# Patient Record
Sex: Male | Born: 1935 | Hispanic: No | Smoking: Former smoker
Health system: Southern US, Community
[De-identification: ages and names within clinical notes are randomized; demographics above are authoritative.]

## PROBLEM LIST (undated history)

## (undated) DIAGNOSIS — K529 Noninfective gastroenteritis and colitis, unspecified: Secondary | ICD-10-CM

## (undated) DIAGNOSIS — K625 Hemorrhage of anus and rectum: Secondary | ICD-10-CM

## (undated) DIAGNOSIS — I739 Peripheral vascular disease, unspecified: Secondary | ICD-10-CM

## (undated) DIAGNOSIS — I2510 Atherosclerotic heart disease of native coronary artery without angina pectoris: Secondary | ICD-10-CM

## (undated) DIAGNOSIS — F32A Depression, unspecified: Secondary | ICD-10-CM

## (undated) DIAGNOSIS — I10 Essential (primary) hypertension: Secondary | ICD-10-CM

## (undated) DIAGNOSIS — K559 Vascular disorder of intestine, unspecified: Secondary | ICD-10-CM

## (undated) DIAGNOSIS — I517 Cardiomegaly: Secondary | ICD-10-CM

## (undated) DIAGNOSIS — I4821 Permanent atrial fibrillation: Secondary | ICD-10-CM

## (undated) DIAGNOSIS — F329 Major depressive disorder, single episode, unspecified: Secondary | ICD-10-CM

## (undated) DIAGNOSIS — M5126 Other intervertebral disc displacement, lumbar region: Secondary | ICD-10-CM

## (undated) HISTORY — DX: Noninfective gastroenteritis and colitis, unspecified: K52.9

## (undated) HISTORY — DX: Hemorrhage of anus and rectum: K62.5

## (undated) HISTORY — DX: Cardiomegaly: I51.7

## (undated) HISTORY — DX: Vascular disorder of intestine, unspecified: K55.9

## (undated) HISTORY — DX: Atherosclerotic heart disease of native coronary artery without angina pectoris: I25.10

## (undated) HISTORY — DX: Peripheral vascular disease, unspecified: I73.9

## (undated) HISTORY — DX: Other intervertebral disc displacement, lumbar region: M51.26

---

## 2002-01-08 HISTORY — PX: CARDIAC CATHETERIZATION: SHX172

## 2002-01-08 HISTORY — PX: CORONARY ARTERY BYPASS GRAFT: SHX141

## 2002-01-16 ENCOUNTER — Encounter: Payer: Self-pay | Admitting: *Deleted

## 2002-01-16 ENCOUNTER — Inpatient Hospital Stay (HOSPITAL_COMMUNITY): Admission: RE | Admit: 2002-01-16 | Discharge: 2002-01-23 | Payer: Self-pay | Admitting: *Deleted

## 2002-01-17 ENCOUNTER — Encounter: Payer: Self-pay | Admitting: Cardiothoracic Surgery

## 2002-01-18 ENCOUNTER — Encounter: Payer: Self-pay | Admitting: Cardiothoracic Surgery

## 2002-01-19 ENCOUNTER — Encounter: Payer: Self-pay | Admitting: Cardiothoracic Surgery

## 2002-01-20 ENCOUNTER — Encounter: Payer: Self-pay | Admitting: Cardiothoracic Surgery

## 2005-05-23 ENCOUNTER — Ambulatory Visit: Payer: Self-pay | Admitting: Internal Medicine

## 2005-05-30 ENCOUNTER — Ambulatory Visit: Payer: Self-pay | Admitting: *Deleted

## 2005-06-06 ENCOUNTER — Ambulatory Visit: Payer: Self-pay | Admitting: Internal Medicine

## 2005-06-14 ENCOUNTER — Ambulatory Visit (HOSPITAL_COMMUNITY): Admission: RE | Admit: 2005-06-14 | Discharge: 2005-06-14 | Payer: Self-pay | Admitting: Internal Medicine

## 2005-06-14 ENCOUNTER — Ambulatory Visit: Payer: Self-pay | Admitting: Internal Medicine

## 2005-06-21 ENCOUNTER — Ambulatory Visit: Payer: Self-pay | Admitting: Internal Medicine

## 2005-07-14 ENCOUNTER — Ambulatory Visit: Payer: Self-pay | Admitting: Family Medicine

## 2005-07-26 ENCOUNTER — Ambulatory Visit: Payer: Self-pay | Admitting: Internal Medicine

## 2005-08-08 ENCOUNTER — Ambulatory Visit: Payer: Self-pay | Admitting: Internal Medicine

## 2005-08-16 ENCOUNTER — Ambulatory Visit: Payer: Self-pay | Admitting: Internal Medicine

## 2005-09-12 ENCOUNTER — Ambulatory Visit: Payer: Self-pay | Admitting: Internal Medicine

## 2006-12-18 ENCOUNTER — Emergency Department (HOSPITAL_COMMUNITY): Admission: EM | Admit: 2006-12-18 | Discharge: 2006-12-18 | Payer: Self-pay | Admitting: Emergency Medicine

## 2007-01-29 DIAGNOSIS — I219 Acute myocardial infarction, unspecified: Secondary | ICD-10-CM | POA: Insufficient documentation

## 2007-01-29 DIAGNOSIS — N3944 Nocturnal enuresis: Secondary | ICD-10-CM

## 2007-01-29 DIAGNOSIS — E119 Type 2 diabetes mellitus without complications: Secondary | ICD-10-CM

## 2007-01-29 DIAGNOSIS — L259 Unspecified contact dermatitis, unspecified cause: Secondary | ICD-10-CM

## 2007-01-29 DIAGNOSIS — I4821 Permanent atrial fibrillation: Secondary | ICD-10-CM

## 2007-01-29 DIAGNOSIS — I150 Renovascular hypertension: Secondary | ICD-10-CM

## 2007-01-30 ENCOUNTER — Encounter (INDEPENDENT_AMBULATORY_CARE_PROVIDER_SITE_OTHER): Payer: Self-pay | Admitting: Internal Medicine

## 2008-08-12 ENCOUNTER — Ambulatory Visit: Payer: Self-pay | Admitting: Internal Medicine

## 2008-08-12 ENCOUNTER — Encounter (INDEPENDENT_AMBULATORY_CARE_PROVIDER_SITE_OTHER): Payer: Self-pay | Admitting: Internal Medicine

## 2008-08-12 LAB — CONVERTED CEMR LAB
ALT: 23 units/L (ref 0–53)
Alkaline Phosphatase: 34 units/L — ABNORMAL LOW (ref 39–117)
BUN: 19 mg/dL (ref 6–23)
Basophils Absolute: 0.1 10*3/uL (ref 0.0–0.1)
Basophils Relative: 1 % (ref 0–1)
Calcium: 9.3 mg/dL (ref 8.4–10.5)
Chloride: 104 meq/L (ref 96–112)
Eosinophils Absolute: 0.1 10*3/uL (ref 0.0–0.7)
Eosinophils Relative: 1 % (ref 0–5)
Glucose, Bld: 143 mg/dL — ABNORMAL HIGH (ref 70–99)
HCT: 45.3 % (ref 39.0–52.0)
Hemoglobin: 14.3 g/dL (ref 13.0–17.0)
Lymphocytes Relative: 20 % (ref 12–46)
Lymphs Abs: 1.4 10*3/uL (ref 0.7–4.0)
MCHC: 31.6 g/dL (ref 30.0–36.0)
MCV: 91.5 fL (ref 78.0–100.0)
Microalb, Ur: 56 mg/dL — ABNORMAL HIGH (ref 0.00–1.89)
Monocytes Absolute: 0.6 10*3/uL (ref 0.1–1.0)
Monocytes Relative: 9 % (ref 3–12)
Neutro Abs: 5 10*3/uL (ref 1.7–7.7)
Platelets: 180 10*3/uL (ref 150–400)
Potassium: 4.7 meq/L (ref 3.5–5.3)
RDW: 13.6 % (ref 11.5–15.5)
Sodium: 141 meq/L (ref 135–145)
Total Bilirubin: 1.4 mg/dL — ABNORMAL HIGH (ref 0.3–1.2)
Total Protein: 7.2 g/dL (ref 6.0–8.3)
WBC: 7.1 10*3/uL (ref 4.0–10.5)

## 2008-08-25 ENCOUNTER — Encounter: Payer: Self-pay | Admitting: Cardiology

## 2008-08-25 ENCOUNTER — Ambulatory Visit: Payer: Self-pay

## 2008-08-27 ENCOUNTER — Ambulatory Visit: Payer: Self-pay | Admitting: Cardiology

## 2008-09-01 ENCOUNTER — Encounter (INDEPENDENT_AMBULATORY_CARE_PROVIDER_SITE_OTHER): Payer: Self-pay | Admitting: Internal Medicine

## 2008-09-01 ENCOUNTER — Ambulatory Visit: Payer: Self-pay | Admitting: Internal Medicine

## 2008-09-01 LAB — CONVERTED CEMR LAB: Digitoxin Lvl: 1 ng/mL (ref 0.8–2.0)

## 2008-09-10 ENCOUNTER — Ambulatory Visit: Payer: Self-pay | Admitting: Internal Medicine

## 2008-09-22 ENCOUNTER — Ambulatory Visit: Payer: Self-pay | Admitting: Cardiology

## 2008-09-25 ENCOUNTER — Encounter (INDEPENDENT_AMBULATORY_CARE_PROVIDER_SITE_OTHER): Payer: Self-pay | Admitting: *Deleted

## 2008-09-26 ENCOUNTER — Encounter: Payer: Self-pay | Admitting: Cardiology

## 2010-08-30 ENCOUNTER — Other Ambulatory Visit: Payer: Self-pay | Admitting: Internal Medicine

## 2010-08-30 DIAGNOSIS — M79605 Pain in left leg: Secondary | ICD-10-CM

## 2010-09-07 ENCOUNTER — Other Ambulatory Visit: Payer: Self-pay | Admitting: Internal Medicine

## 2010-09-07 ENCOUNTER — Ambulatory Visit
Admission: RE | Admit: 2010-09-07 | Discharge: 2010-09-07 | Disposition: A | Discharge status: Home / Self Care | Payer: Medicaid Other | Source: Ambulatory Visit | Attending: Internal Medicine | Admitting: Internal Medicine

## 2010-09-07 ENCOUNTER — Other Ambulatory Visit: Payer: Self-pay

## 2010-09-07 DIAGNOSIS — M79604 Pain in right leg: Secondary | ICD-10-CM

## 2010-09-07 DIAGNOSIS — I10 Essential (primary) hypertension: Secondary | ICD-10-CM

## 2010-09-27 ENCOUNTER — Encounter: Payer: Self-pay | Admitting: Internal Medicine

## 2010-09-27 ENCOUNTER — Institutional Professional Consult (permissible substitution) (INDEPENDENT_AMBULATORY_CARE_PROVIDER_SITE_OTHER): Payer: Medicaid Other | Admitting: Internal Medicine

## 2010-09-27 DIAGNOSIS — I739 Peripheral vascular disease, unspecified: Secondary | ICD-10-CM

## 2010-09-27 DIAGNOSIS — I7389 Other specified peripheral vascular diseases: Secondary | ICD-10-CM | POA: Insufficient documentation

## 2010-09-27 DIAGNOSIS — I2510 Atherosclerotic heart disease of native coronary artery without angina pectoris: Secondary | ICD-10-CM

## 2010-09-27 DIAGNOSIS — I4891 Unspecified atrial fibrillation: Secondary | ICD-10-CM

## 2010-09-27 DIAGNOSIS — I10 Essential (primary) hypertension: Secondary | ICD-10-CM

## 2010-10-07 NOTE — Assessment & Plan Note (Signed)
Summary: nep/abnormal ekg/ref roy moreira md [phone]/medicare pt dtr ...   Visit Type:  Initial Consult Primary Provider:  Dr. Emilio Math  CC:  pt says when he eats sometimes his chest hurts. pt has no other complaints today.[name]  History of Present Illness: [name] is referred today by Dr. Cindee Lame for evaluation of atrial fib and peripheral vascular disease. He has known CAD and underwent CABG in 2003. He has persistent HTN and this has been difficult to control. With regard to his atrial fib, he has rare palpitations but he is mainly limited by claudication. He recently underwent ABI's which demonstrated non-compressible flow in the right leg and an ABI of 0.59 on the left. He has not had syncope or chest pain.   Medications Prior to Update: 1)  Digoxin 0.25 Mg  Tabs (Digoxin) .... One By Mouth Qd 2)  Metformin Hcl 500 Mg  Tabs (Metformin Hcl) .... One By Mouth Bid 3)  Cardizem Cd 120 Mg  Cp24 (Diltiazem Hcl Coated Beads) .... One By Mouth Once Daily 4)  Coumadin 2 Mg  Tabs (Warfarin Sodium) .... Per [facility] 5)  Glyburide 5 Mg  Tabs (Glyburide) .[name].. 1 Tablet By Mouth Daily 6)  Lisinopril 5 Mg Tabs (Lisinopril) .[name].. 1 Tablet By Mouth Daily 7)  Verapamil Hcl 80 Mg Tabs (Verapamil Hcl) .... Once Daily 8)  Coumadin 5 Mg Tabs (Warfarin Sodium) .... Uad 9)  Aciphex 20 Mg Tbec (Rabeprazole Sodium) .[name].. 1 Once Daily  Current Medications (verified): 1)  Coumadin 2 Mg  Tabs (Warfarin Sodium) .... Per [facility] 2)  Coumadin 5 Mg Tabs (Warfarin Sodium) .... Uad 3)  Tradjenta 5 Mg Tabs (Linagliptin) .... Take 1 Tablet By Mouth Once A Day 4)  Bystolic 5 Mg Tabs (Nebivolol Hcl) .... .qdtab  Allergies (verified): No Known Drug Allergies  Past History:  Past Medical History: Last updated: 08-29-2008 Severe three-vessel coronary artery disease with class III angina. Dibeties type II Atrial Fibrillation  Past Surgical History: Last updated: 01/29/2007 Coronary artery  bypass graft  Family History: Last updated: 08-29-2008 His father died of war injuries in World War II.  His mother died of chronic lung disease.  He has a positive family history of hypertension and diabetes.  Social History: Last updated: Aug 29, 2008 Widowed  Tobacco Use - Former.  Alcohol Use - no  Risk Factors: Smoking Status: quit (August 29, 2008)  Review of Systems       All systems reviewed and negative except as noted in the HPI.  Vital Signs:  Patient profile:   77 year old male Height:      69 inches Weight:      173.50 pounds BMI:     25.71 Pulse rate:   60 / minute Resp:     18 per minute BP sitting:   160 / 72  (left arm) Cuff size:   regular  Vitals Entered By: Celestia Khat, CMA (September 27, 2010 1:51 PM)  Physical Exam  General:  Elderly, well developed, well nourished, in no acute distress.  HEENT: normal Neck: supple. No JVD. Carotids 2+ bilaterally no bruits Cor: RRR no rubs, gallops or murmur Lungs: CTA. Well healed median sternotomy Ab: soft, nontender. nondistended. No HSM. Good bowel sounds Ext: warm. no cyanosis, clubbing or edema. Lower legs are cool and pulses are reduced. Neuro: alert and oriented. Grossly nonfocal. affect pleasant    Impression & Recommendations:  Problem # 1:  FIBRILLATION, ATRIAL (ICD-427.31) His ventricular response is well controlled and his  symptoms are well controlled. He will continue meds as below. The following medications were removed from the medication list:    Digoxin 0.25 Mg Tabs (Digoxin) ..... One by mouth qd His updated medication list for this problem includes:    Coumadin 2 Mg Tabs (Warfarin sodium) .[name]... Per coumadin clinic    Coumadin 5 Mg Tabs (Warfarin sodium) ..... Uad    Bystolic 5 Mg Tabs (Nebivolol hcl) ..... Glenn Kitchenqdtab  Problem # 2:  PVD WITH CLAUDICATION (ICD-443.89) His symptoms are fairly severe. I have discussed additional workup. I have reviewed the treatment options with the patient and his  daughter who is his interpreter and speaks english well. He likely has severe disease. He does not have a non-healing ulcer or rest symptoms. I have offered him angiography and treatment based on findings at angiography. He is considering his options.   Problem # 3:  HYPERTENSION, BENIGN (ICD-401.1) His blood pressure is elevated. He has had longstanding HTN which has been difficult to control. The following medications were removed from the medication list:    Cardizem Cd 120 Mg Cp24 (Diltiazem hcl coated beads) ..... One by mouth once daily    Lisinopril 5 Mg Tabs (Lisinopril) .[name]... 1 tablet by mouth daily    Verapamil Hcl 80 Mg Tabs (Verapamil hcl) ..... Once daily His updated medication list for this problem includes:    Bystolic 5 Mg Tabs (Nebivolol hcl) ..... Glenn Kitchenqdtab

## 2010-10-07 NOTE — Letter (Signed)
Summary: Out of Work  [employer], Main Office  [address]   [place], [place] [address]   Phone: [phone]  Fax: [phone]    September 27, 2010   Employee:  [place], [place]    To Whom It May Concern:   For Medical reasons, please excuse the above named employee from work for the following dates:  Start:   09/27/10  End:   09/09/10 She ws with her father at the doctors office.  If you need additional information, please feel free to contact our office.         Sincerely,    Dennis Bast, RN, BSN

## 2010-11-23 NOTE — Assessment & Plan Note (Signed)
[facility]                            CARDIOLOGY OFFICE NOTE   NAME:MARINKOVICKarandeep, Resende                      MRN:          [id number]  DATE:08/27/2008                            DOB:          Sep 07, 1931    The patient has coronary disease.  He underwent CABG in 2003.   Mr. Lamp does not speak English.  He is here with his daughter who  does speak [place].  He is from [place] and over the years he has  traveled back and forth between [place] and the [place].  He has a  history of coronary disease.  He also has a history of atrial  fibrillation.  In the past, he was on Coumadin.  He is no longer on  Coumadin.  I do not know all of the reasons.  He is not having  exertional chest pain or exertional shortness of breath.  His daughter  describes a sensation of feeling of fluid in his upper chest and up  towards his ears.  This is a very difficult description to understand.  He has not had any syncope or presyncope.   ALLERGIES:  No known drug allergies.   MEDICATIONS:  1. Digitek 0.25.  2. Metformin.  3. Glyburide.  4. Verapamil 80.  5. Lisinopril 5.   OTHER MEDICAL PROBLEMS:  See the list below.   REVIEW OF SYSTEMS:  At this time, he is not having any headache or  significant problems.  He does not have any fevers or chills.  There are  no skin rashes.  He is not having any GU symptoms.  I believe that he  may be describing GERD with some of his symptoms.  Otherwise, his review  of systems is negative.   PHYSICAL EXAMINATION:  Blood pressure is 150/90.  Pulse is 110.  The  patient is oriented through the interpretation of his daughter.  HEENT  reveals no xanthelasma.  He has normal extraocular motion.  There are no  carotid bruits.  There is no jugular venous distention.  Lungs are  clear.  Respiratory effort is not labored.  Cardiac exam reveals an S1  with an S2.  There are no clicks or significant murmurs.  His abdomen is  soft.  He  has no significant peripheral edema.   EKG reveals atrial fibrillation with a rapid ventricular response.  He  has diffuse nonspecific ST-T wave changes that are old.  The daughter  has brought multiple x-rays that were done in [place] over the years that  show that he has cardiomegaly.  The patient had a 2-D echo in our office  dated August 25, 2008.  At that time, ejection fraction was 55%.  There was moderate left ventricular hypertrophy.  There was mild mitral  regurgitation.   Problems include  1. Diabetes.  2. Coronary artery disease with coronary artery bypass graft in 2003.  3. Chronic atrial fibrillation.  Currently, his rate is increased.  We      will adjust his medicines further for better rate control.  4. Good left ventricular systolic function by echo on  August 25, 2008.  5. Moderate left ventricular hypertrophy.  6. Sensation of fluid in his upper chest and up his neck.  Etiology      of this is not clear to me.  I believe it is possible that his      rapid atrial fibrillation rate may be giving him some symptoms.   The patient is on a low dose of verapamil.  This will be changed to  Cardizem CD 240 mg daily.  I believe that he may be having some reflux  symptoms.  Omeprazole will be added.  We will see him for cardiology  followup to see if I can help with his atrial fib rate.  Over time, we  will decide if Myoview scan is appropriate.  Also consideration has to  be given to the use of Coumadin.  He was on this in the past.  The  guidelines would call for the use Coumadin in his case.     Luis Abed, MD, [facility]  Electronically Signed    JDK/MedQ  DD: 08/27/2008  DT: 08/28/2008  Job #: [id number]   cc:   Dala Dock on Cleophas Dunker.  Madaline Savage Id-Din, FNP

## 2010-11-26 NOTE — Cardiovascular Report (Signed)
[place]. [facility]  Patient:    LEVANTE, SIMONES Visit Number: [id number] MRN: [id number]          Service Type: SUR Location: 2300 2303 [id number] Attending Physician:  Mikey Bussing Dictated by:   Veneda Melter, M.D. Proc. Date: 01/16/02 Admit Date:  01/16/2002   CC:         Dietrich Pates, M.D. [facility]   Cardiac Catheterization  PROCEDURES PERFORMED: 1. Left heart catheterization. 2. Left ventriculogram. 3. Selective coronary angiography.  DIAGNOSES: 1. Three-vessel coronary artery disease. 2. Normal left ventricular systolic function. 3. Atrial fibrillation.  HISTORY: The patient is a 76 year old white male who is a resident of [place], who is visiting his daughter, who presents with atrial fibrillation and substernal chest discomfort. The patient was told he had a myocardial infarction a number of years ago and this has been treated medically. Unfortunately, he has had crescendo angina as well as new onset atrial fibrillation. He presents for further cardiac assessment.  TECHNIQUE: After informed consent was obtained, the patient was brought to the cardiac catheterization lab where a 6 French sheath was placed in the right femoral artery using modified Seldinger technique. The 6 [place] and JR4 catheters were then used to engage the left and right coronary arteries and selective angiography performed in various projections using manual injections of contrast. A 6 French pigtail was then advanced into the left ventricle and a left ventriculogram performed using power injections of contrast. At the termination of the case the catheters and sheath were removed and manual pressure applied until adequate hemostasis was achieved. The patient tolerated the procedure well and was transferred to the ward in stable condition.  FINDINGS: Findings are as follows: 1. Left main trunk: The left main trunk is calcified. There is distal    taper approximately  40-50% extending into the ostium of the LAD. 2. LAD: The ostium of the LAD has a circumferential haziness that appears    to now lumen by at least 50-60%. The vessel is calcified along its    proximal and mid sections. There is further narrowing of 60% in the mid    section after the first diagonal branch. The first diagonal branch is    a small caliber vessel with moderate disease of 30-40% in the proximal    segment. 3. Left circumflex artery: This is a small caliber vessel that consists of    distal marginal branch with a long tubular narrowing of 70% in the mid    circumflex. 4. Ramus intermedius: There are two medium caliber ramus intermedius    branches. Both appear to be pinched at the ostium by 70%. 5. Right coronary artery is dominant. This is a medium caliber vessel    that provides the posterior descending artery and two posterior ventricular    branches in its terminal segment. The right coronary artery is heavily    calcified in the proximal and mid section. There is a narrowing of 70% in  the proximal segment followed by a long segment of disease of 50-60%.    There is a focal narrowing of 70% in the distal section prior to the    takeoff of the posterior descending artery. The distal branch vessels have    mild irregularities.  LEFT VENTRICULOGRAM: Normal end-systolic and end-diastolic dimensions. Overall left ventricular function is mildly impaired, ejection fraction approximately 40%.  No significant mitral regurgitation is appreciated. LV pressure is 140/15, aortic is 140/100, LVEDP equals 25. The  patient is in atrial fibrillation.  ASSESSMENT AND PLAN: The patient is a 77 year old gentleman with crescendo now unstable angina, who presents with three-vessel coronary artery disease and mild left ventricular dysfunction. Treatment options will be reviewed with the patient and his family including percutaneous intervention to the right coronary artery versus surgical  revascularization. In addition, the patient needs rate control anticoagulation for his atrial fibrillation as well as control of his hypertension. Dictated by:   Veneda Melter, M.D. Attending Physician:  Mikey Bussing DD:  01/16/02 TD:  01/19/02 Job: [id number]

## 2010-11-26 NOTE — Consult Note (Signed)
[place]. [facility]  Patient:    [name], [name] Visit Number: [id number] MRN: [id number]          Service Type: CAT Location: 3700 3727 [id number] Attending Physician:  Veneda Melter Dictated by:   Mikey Bussing, M.D. Proc. Date: 01/16/02 Admit Date:  01/16/2002   CC:         Veneda Melter, M.D.  Dietrich Pates, M.D. [facility]  CVTS Office   Consultation Report  REQUESTING PHYSICIAN:  Veneda Melter, M.D.  REASON FOR CONSULTATION:  Severe three-vessel coronary artery disease with class III angina.  CHIEF COMPLAINT:  Chest pain and shortness of breath.  HISTORY OF PRESENT ILLNESS:  I was asked to evaluate Mr. [name] for potential surgical coronary revascularization for recently diagnosed severe three-vessel coronary artery disease.  The patient presented to the [facility] Cardiology Service with recent onset of exertional chest pain associated with palpitations.  He apparently has had anginal substernal pain as well as jaw discomfort in the past relieved by nitroglycerin and usually occurring with exertion and not usually occurring at rest.  The patient has had an increase in his symptoms of substernal pressing chest pain which is associated with a rapid irregular heart rate.  He was found to be in new onset atrial fibrillation in June of this year.  He was felt to be at high risk for coronary disease with known peripheral vascular disease, diabetes, and history of smoking.  For that reason, he was brought to the hospital and underwent diagnostic cardiac catheterization by Dr. Chales Abrahams today.  This demonstrated a 60% LAD diagonal stenosis, 80% stenosis of the right coronary artery, and 70 to 80% stenosis of the circumflex marginal.  His ejection fraction is 50%, and left ventricular end-diastolic pressure is 15 mmHg.  Because of the patients significant three-vessel disease and his bad coronary anatomy, he was felt to be a candidate for potential surgical  coronary revascularization.  PAST MEDICAL HISTORY: 1. Type 2 diabetes mellitus. 2. Hypertension. 3. Peripheral vascular disease with history of claudication and ABIs of    0.7 bilaterally. 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  MEDICATIONS:  The patient takes nitroglycerin as needed and Isordil at home.  ALLERGIES:  No known drug allergies.  PAST SURGICAL HISTORY:  He has had metal removed from his eye.  No other surgical procedures other than an arteriogram of his lower extremities performed several years ago in [place].  SOCIAL HISTORY:  The patient is visiting here from [place] with his daughter, [name].  His wife died a few years ago.  He smoked up until five years ago at which time he stopped.  He stopped smoking due to claudication and states his claudication has improved.  He does not use alcohol.  FAMILY HISTORY:  His father died of war injuries in World War II.  His mother died of chronic lung disease.  He has a positive family history of hypertension and diabetes.  REVIEW OF SYSTEMS:  The patient denies any significant recent weight loss, fevers, or night sweats.  He denies change in vision or hearing recently.  He has upper dental plates.  He denies dysphagia.  Pulmonary review is positive for dyspnea on exertion.  No history of recent pneumonia, hemoptysis, asthma, TB, or chest trauma or rib fractures.  Cardiac review is positive for exertional angina, negative for resting angina, orthopnea, or PND.  GI review is negative for hepatitis, jaundice, blood per rectum.  GU review is negative for hematuria, prostatitis, or  kidney stones.  Vascular review is positive for peripheral vascular disease with claudication and ABI of 0.7 bilaterally.  He has moderate innominate artery stenosis by Doppler exam.  Skin review is negative for skin cancer or rash.  Hematologic review is negative for bleeding diaphysis.  Neurologic review is negative for stroke, seizure, or  syncope.  he is right-hand dominant.  PHYSICAL EXAMINATION:  VITAL SIGNS:   He is 5 feet 9 inches, weighs 175 pounds.  Blood pressure is measured at 140/80, heart rate 84 and irregular in atrial fibrillation.  GENERAL:  Middle-aged white male in no distress in his hospital room following cardiac catheterization, accompanied by his daughter, [name].  HEENT:  ENT exam is normocephalic.  Full EOMs.  Pharynx clear.  Upper dental plates.  Lower teeth in adequate repair.  NECK:  Without JVD, thyromegaly, mass, or bruits.  LYMPHATICS:  No palpable adenopathy of cervical, supraclavicular, or axillary regions.  CHEST:  Thorax is without deformity.  Breath sounds are clear.  CARDIAC:  Regular rate and rhythm without murmur.  He is in atrial fibrillation.  ABDOMEN:  Soft, nontender without organomegaly or bruit.  VASCULAR:  Reveals 2+ pulses bilaterally in the radial and femoral.  No palpable pedal pulses.  He has no venous insufficiency of the lower extremities.  RECTAL:  Exam is deferred.  NEUROLOGIC:  Alert and oriented x 3 with full motor function.  SKIN:  Warm, clear, and dry without skin lesion or rash.  DIAGNOSTIC DATA:  I reviewed the coronary arteriograms performed today and agree that this 77 year old diabetic with significant three-vessel disease and active angina would benefit from surgical coronary revascularization with expected improvement of symptoms, preservation of LV function, and improved survival.  I discussed the major and expected benefits of the procedure with the patient and his daughter.  I discussed the major aspects of the proceed as well including the location of the incisions, use of general anesthesia and cardiopulmonary bypass, and the expected postoperative hospital recovery.  I  reviewed the associated risks of pneumonia, CVA, bleeding, MI, infection, blood transfusion requirement, and death.  He understood these aspects of the procedure.  After  and extensive discussion and translation by his daughter, [name], he agrees to proceed with the operation as planned under what I feel is an informed consent. Dictated by:   Mikey Bussing, M.D. Attending Physician:  Veneda Melter DD:  01/16/02 TD:  01/16/02 Job: [id number]

## 2010-11-26 NOTE — Op Note (Signed)
[place]. [facility]  Patient:    [name], [name] Visit Number: [id number] MRN: [id number]          Service Type: SUR Location: 2000 2007 01 Attending Physician:  [name] Dictated by:   [name], M.D. Proc. Date: 01/17/02 Admit Date:  01/16/2002   CC:         [name], M.D. [facility]   Operative Report  PREOPERATIVE DIAGNOSIS:  Class III progressive angina with severe three-vessel coronary artery disease and reduced left ventricular function.  POSTOPERATIVE DIAGNOSIS:  Class III progressive angina with severe three-vessel coronary artery disease and reduced left ventricular function.  PROCEDURE:  Coronary artery bypass grafting x4 (left internal mammary artery to left anterior descending coronary artery, saphenous vein graft to obtuse marginal, sequential saphenous vein graft to posterior descending and posterolateral branch of the right coronary).  SURGEON:  [name], M.D.  ASSESSMENT:  [name], P.A.  ANESTHESIA:  General by Maren Beach, M.D.  INDICATION:  The patient is a 77 year old type 2 diabetic who presented with new-onset atrial fibrillation and exertional chest pain.  He had several risk factors for coronary disease, including diabetes, history of smoking, and peripheral vascular disease.  He underwent cardiac catheterization by [name], M.D., which demonstrated high-grade stenosis of the right coronary, high-grade stenosis of the circumflex, and 80% stenosis of the proximal LAD. His ejection fraction was 40%.  He was felt to be a candidate for surgical coronary revascularization.  Prior to the operation I examined the patient in his hospital room following cardiac catheterization and discussed the results of cardiac catheterization with the patient through his daughter, who acted as a Nurse, learning disability Teacher, adult education).  I discussed with the patient and daughter the indications and expected benefits of  coronary bypass surgery as well as the associated risks of MI, CVA, bleeding, infection, blood transfusion requirement, and death.  I tried to make them understand to the best of my ability the major aspects of the procedure, including the location of the surgical incisions, the choice of conduit for grafting, the use of general anesthesia and cardiopulmonary bypass, and what he would expect as far as recovery in the hospital and then follow-up at home.  I reviewed with him the alternatives to surgery and what would be expected to happen to this individual without surgery.  All questions were answered, and the patient and daughter agreed under what I felt was an informed consent to proceed with surgery.  DESCRIPTION OF PROCEDURE:  The patient was brought to the operating room and placed supine on the operating room table, where general anesthesia was induced under invasive hemodynamic monitoring.  The chest, abdomen, and legs were prepped with Betadine and draped as a sterile field.  A sternal incision was made as the saphenous vein was harvested from the right upper leg using the endoscopic technique.  The left internal mammary artery was harvested as a pedicle graft from its origin at the subclavian vessels and was a good vessel with excellent flow.  Heparin was administered, and the ACT was documented as being therapeutic.  The sternal retractor was placed.  Through pursestrings placed in the ascending aorta and right atrium, the patient was cannulated and placed on bypass and cooled to 30 degrees.  The coronaries were identified for grafting, and the mammary artery and vein grafts were prepared for the distal anastomoses.  A cardioplegia cannula was placed, and the patient was cooled to 28 degrees.  As the aortic crossclamp was applied, 550 cc of cold blood cardioplegia was delivered to the aortic root with immediate cardioplegic arrest and septal temperature dropping to less than 14  degrees.  Topical iced saline slush was used to augment myocardial preservation, and a pericardial insulator pad was used to protect the left phrenic nerve.  The distal coronary anastomoses were then performed.  The first distal anastomosis consisted of a sequential vein graft to the posterior descending and then continuing to the posterolateral.  The posterior descending was a 1.5 mm vessel with proximal 95% stenosis.  A side-to-side anastomosis with the saphenous vein was performed using a running 7-0 Prolene with good flow through the graft.  The second distal anastomosis was the continuation of th sequential vein to the posterolateral.  This was a 1.4 mm vessel with proximal 90% stenosis, and the end of the vein was sewn end-to-side with a running 7-0 Prolene.  Cardioplegia was redosed.  The third distal anastomosis was to the circumflex marginal, high close to the AV groove.  This was a 1.5 mm vessel with proximal 95% stenosis.  A reversed saphenous vein was sewn end-to-side with a running 7-0 Prolene, and there was good flow through the graft.  The fourth distal anastomosis was to the midaspect of the LAD.  This was a 1.8 mm vessel with proximal 85-90% stenosis.  The left internal mammary artery pedicle was brought through an opening created in the left lateral pericardium and was brought down onto the LAD and sewn end-to-side with running 8-0 Prolene.  There was excellent flow through the anastomosis with immediate rise in septal temperature after release of the pedicle clamp on the mammary artery.  The mammary pedicle was secured to the epicardium, and the aortic crossclamp was removed.  The heart was cardioverted back to a regular rhythm.  Two proximal vein anastomoses were placed on the ascending aorta using a partial occluding clamp and a 4.0 mm punch.  The partial clamp was removed and the vein grafts were perfused.  Each graft had good flow, and hemostasis was documented at  the proximal and distal anastomoses.  The patient was rewarmed and reperfused.  Temporary pacing wires were applied.  The lungs were re-expanded and the ventilator was resumed and when the patient reached 37 degrees, he was weaned from bypass on low-dose dopamine with stable blood pressure and good cardiac output.  Protamine was administered.  There was no adverse reaction to the protamine.  The cannulas were removed.  The mediastinum was irrigated with warm antibiotic irrigation.  The leg incision was irrigated and closed in a standard fashion.  The pericardium was loosely reapproximated.  Two mediastinal and a left pleural chest tube were placed and brought out through separate incisions.  The sternum was closed with interrupted steel wire.  The pectoralis fascia was closed with interrupted #1 Vicryl.  The subcutaneous and skin were closed with a running Vicryl.  Sterile dressings were applied. Total bypass time was 110 minutes with aortic crossclamp time of 50 minutes. Dictated by:   [name], M.D. Attending Physician:  [name] DD:  01/17/02 TD:  01/21/02 Job: [id number]

## 2010-11-26 NOTE — Discharge Summary (Signed)
[place]. [facility]  Patient:    [name], [name] Visit Number: [id number] MRN: [id number]          Service Type: SUR Location: 2000 2007 01 Attending Physician:  Mikey Bussing Dictated by:   Hulen Shouts, P.A. Admit Date:  01/16/2002 Disc. Date: 01/22/02   CC:         CVTS Office  Luis Abed, M.D. [facility]   Discharge Summary  DATE OF BIRTH:  1931/12/18  PRIMARY ADMISSION DIAGNOSIS:  Unstable angina.  SECONDARY DIAGNOSIS/PAST MEDICAL HISTORY: 1. Coronary artery disease status post myocardial infarction approximately    7-8 years ago. 2. Hypertension. 3. Diet-controlled diabetes mellitus. 4. History of eye injury secondary to metal.  NEW DIAGNOSIS/DISCHARGE DIAGNOSES: 1. Three vessel coronary artery disease, left ventricular dysfunction,    status post coronary artery bypass graft surgery. 2. Preoperative and postoperative atrial fibrillation. 3. Peripheral vascular disease with ankle brachial indices of 0.7    bilaterally. 4. 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 5. Postoperative delirium, resolved.  PROCEDURES: 1. Cardiac catheterization done on January 16, 2002. 2. Coronary artery bypass graft surgery x4 done on January 17, 2002 with the    following grafts placed: Left internal mammary artery to the left    anterior descending artery; sequential saphenous vein graft to the    posterior descending and posterolateral branch; saphenous vein graft to    the circumflex branch with endoscopic vein harvest of the right thigh    done by Dr. Kathlee Nations Trigt.  HOSPITAL COURSE:  The patient was admitted electively on January 16, 2002 after being seen in the office by Dr. Myrtis Ser.  He is a 77 year old [place] man who does not speak any [place].  His daughter acts as his translater.  He has been seen by Dr. Tenny Craw through another patient of Dr. Tenny Craw.  He has chronic unstable angina and has been using nitroglycerin for a number of years.  He had  been seen at [facility] on December 14, 2001 and was complaining of chest tightness and bilateral arm discomfort, as well as the jaw discomfort relieved with rest and/or nitroglycerin.  He was then noted to be in atrial fibrillation and was asked to come into the hospital for a cardiac catheterization but he declined at that time.  Nevertheless, it was suggested that he take Toprol and Coumadin but he declined the medications as well.  He was back on January 14, 2002 for followup.  He is doing a little better although he still continues to have chest tightness and has agreed to an elective heart catheterization, which he underwent on January 16, 2002.  He tolerated that well. He was found to have three vessel coronary artery disease as well as innominate artery stenosis.  He was seen in consultation by Dr. Donata Clay for coronary vascularization and it was agreed the patient would benefit from the procedure stated above.  He underwent the procedure on January 17, 2002.  He tolerated the procedure well.  It was found intraoperatively that his platelet count was 80,000.  His platelet transfusions were given.  He was transferred stable to the ICU.  He remained stable early postoperatively.  He did have some increased urine output postoperatively and his volume status was monitored closely.  Nevertheless, on postoperative day #1, he was extubated, his hematocrit was 35, his platelets were 138.  He continued to remain stable from a hemodynamic standpoint.  His blood pressure was controlled.  He was  in sinus rhythm.  Nevertheless, on postoperative day #2, he converted to atrial fibrillation with a rapid ventricular response and was started on Coumadin, Digoxin, and Lopressor.  In addition, he had some brief episode of delirium without the ability to translate to calm the patient down.  Nevertheless, he was placed in a restraint secondary to his violent behavior, as well as given some IV Haldol, Ativan,  and morphine.  He was kept in the ICU overnight.  His rate was eventually controlled although he did remain in atrial fibrillation. His delirium had since subsided.  He was tolerating a regular diet.  He was ambulating daily with cardiac and rehab phase I.  His incisions remained clean and dry without any signs of infection or complications.  It is anticipated that he will be ready for discharge the morning of January 22, 2002 as long as his INR is therapeutic from starting his Coumadin with his perioperative atrial fibrillation.  DISCHARGE CONDITION:  Stable.  DISCHARGE MEDICATIONS: 1. Lopressor 25 b.i.d. 2. Aspirin 81 mg daily. 3. Coumadin 5 mg tablets.  He is to take as directed by Dr. Myrtis Ser. 4. Digoxin 0.25 mg daily. 5. Darvocet-N 100, 1-2 tablets q.4h as needed for pain. 6. Cardizem 240 mg daily.  ACTIVITY AND FOLLOWUP: 1. He is instructed not to do any driving, heavy lifting, or strenuous    activity. 2. He is to walk daily and continue his breathing exercise. 3. He is to follow a heart-healthy diet. 4. He is told he may shower. 5. He is to notify the office for any increased redness, swelling or    drainage from his incisions or if he has a temperature greater than    101 degrees Fahrenheit. 6. He is to have his blood work drawn either Thursday or Friday of this week;    that is to be set up through [facility] and followed by    Dr. Myrtis Ser and/or Dr. Tenny Craw. 7. He is to see Dr. Tenny Craw in two weeks for a chest x-ray and the appointment    is to be arranged and he is to see Dr. Donata Clay on Friday, August 8 at    9:15 a.m. and he is instructed to bring his chest x-ray with him at that    time. Dictated by:   Hulen Shouts, P.A. Attending Physician:  Mikey Bussing DD:  01/21/02 TD:  01/23/02 Job: [id number]

## 2011-01-25 ENCOUNTER — Emergency Department (HOSPITAL_COMMUNITY): Payer: Medicaid Other

## 2011-01-25 ENCOUNTER — Observation Stay (HOSPITAL_COMMUNITY)
Admission: EM | Admit: 2011-01-25 | Discharge: 2011-02-01 | Disposition: A | Discharge status: Home Health | Payer: Medicaid Other | Attending: Internal Medicine | Admitting: Internal Medicine

## 2011-01-25 DIAGNOSIS — M549 Dorsalgia, unspecified: Secondary | ICD-10-CM | POA: Insufficient documentation

## 2011-01-25 DIAGNOSIS — I517 Cardiomegaly: Secondary | ICD-10-CM | POA: Insufficient documentation

## 2011-01-25 DIAGNOSIS — E119 Type 2 diabetes mellitus without complications: Secondary | ICD-10-CM | POA: Insufficient documentation

## 2011-01-25 DIAGNOSIS — I2510 Atherosclerotic heart disease of native coronary artery without angina pectoris: Secondary | ICD-10-CM | POA: Insufficient documentation

## 2011-01-25 DIAGNOSIS — I4891 Unspecified atrial fibrillation: Secondary | ICD-10-CM | POA: Insufficient documentation

## 2011-01-25 DIAGNOSIS — M79609 Pain in unspecified limb: Secondary | ICD-10-CM | POA: Insufficient documentation

## 2011-01-25 DIAGNOSIS — I70209 Unspecified atherosclerosis of native arteries of extremities, unspecified extremity: Secondary | ICD-10-CM | POA: Insufficient documentation

## 2011-01-25 DIAGNOSIS — R262 Difficulty in walking, not elsewhere classified: Secondary | ICD-10-CM | POA: Insufficient documentation

## 2011-01-25 DIAGNOSIS — M5126 Other intervertebral disc displacement, lumbar region: Principal | ICD-10-CM | POA: Insufficient documentation

## 2011-01-25 DIAGNOSIS — I10 Essential (primary) hypertension: Secondary | ICD-10-CM | POA: Insufficient documentation

## 2011-01-25 LAB — BASIC METABOLIC PANEL
BUN: 20 mg/dL (ref 6–23)
Chloride: 101 mEq/L (ref 96–112)
Creatinine, Ser: 1.35 mg/dL (ref 0.50–1.35)
GFR calc Af Amer: >60 mL/min (ref >60)
Glucose, Bld: 148 mg/dL — ABNORMAL HIGH (ref 70–99)

## 2011-01-25 LAB — CBC
HCT: 40.1 % (ref 39.0–52.0)
Hemoglobin: 14 g/dL (ref 13.0–17.0)
RBC: 4.49 MIL/uL (ref 4.22–5.81)
WBC: 7.5 10*3/uL (ref 4.0–10.5)

## 2011-01-26 ENCOUNTER — Observation Stay (HOSPITAL_COMMUNITY): Payer: Medicaid Other

## 2011-01-26 DIAGNOSIS — M79609 Pain in unspecified limb: Secondary | ICD-10-CM

## 2011-01-26 DIAGNOSIS — I369 Nonrheumatic tricuspid valve disorder, unspecified: Secondary | ICD-10-CM

## 2011-01-26 LAB — MAGNESIUM: Magnesium: 2.2 mg/dL (ref 1.5–2.5)

## 2011-01-26 LAB — CK TOTAL AND CKMB (NOT AT ARMC)
CK, MB: 3.2 ng/mL (ref 0.3–4.0)
Relative Index: INVALID (ref 0.0–2.5)

## 2011-01-26 LAB — TROPONIN I: Troponin I: <0.3 ng/mL (ref <0.30)

## 2011-01-26 LAB — GLUCOSE, CAPILLARY
Glucose-Capillary: 253 mg/dL — ABNORMAL HIGH (ref 70–99)
Glucose-Capillary: 283 mg/dL — ABNORMAL HIGH (ref 70–99)

## 2011-01-26 LAB — CARDIAC PANEL(CRET KIN+CKTOT+MB+TROPI)
CK, MB: 3.9 ng/mL (ref 0.3–4.0)
Relative Index: 2 (ref 0.0–2.5)
Total CK: 242 U/L — ABNORMAL HIGH (ref 7–232)
Troponin I: <0.3 ng/mL (ref <0.30)

## 2011-01-26 LAB — BASIC METABOLIC PANEL
CO2: 23 mEq/L (ref 19–32)
Chloride: 101 mEq/L (ref 96–112)
GFR calc Af Amer: >60 mL/min (ref >60)
Potassium: 4.2 mEq/L (ref 3.5–5.1)
Sodium: 136 mEq/L (ref 135–145)

## 2011-01-26 LAB — TSH: TSH: 9.49 u[IU]/mL — ABNORMAL HIGH (ref 0.350–4.500)

## 2011-01-26 LAB — DIGOXIN LEVEL: Digoxin Level: 0.4 ng/mL — ABNORMAL LOW (ref 0.8–2.0)

## 2011-01-26 LAB — PROTIME-INR: INR: 2.04 — ABNORMAL HIGH (ref 0.00–1.49)

## 2011-01-27 LAB — GLUCOSE, CAPILLARY
Glucose-Capillary: 135 mg/dL — ABNORMAL HIGH (ref 70–99)
Glucose-Capillary: 154 mg/dL — ABNORMAL HIGH (ref 70–99)
Glucose-Capillary: 134 mg/dL — ABNORMAL HIGH (ref 70–99)

## 2011-01-27 LAB — BASIC METABOLIC PANEL
BUN: 29 mg/dL — ABNORMAL HIGH (ref 6–23)
CO2: 27 mEq/L (ref 19–32)
Chloride: 101 mEq/L (ref 96–112)
Creatinine, Ser: 1.47 mg/dL — ABNORMAL HIGH (ref 0.50–1.35)
GFR calc Af Amer: 56 mL/min — ABNORMAL LOW (ref >60)
Potassium: 4 mEq/L (ref 3.5–5.1)

## 2011-01-27 LAB — PROTIME-INR: Prothrombin Time: 29.2 seconds — ABNORMAL HIGH (ref 11.6–15.2)

## 2011-01-27 LAB — CBC
HCT: 37.5 % — ABNORMAL LOW (ref 39.0–52.0)
Hemoglobin: 12.7 g/dL — ABNORMAL LOW (ref 13.0–17.0)
MCH: 30.7 pg (ref 26.0–34.0)
MCHC: 33.9 g/dL (ref 30.0–36.0)
RBC: 4.14 MIL/uL — ABNORMAL LOW (ref 4.22–5.81)

## 2011-01-27 NOTE — H&P (Signed)
NAME:  [name], [name]             ACCOUNT NO.:  [id number]  MEDICAL RECORD NO.:  [id number]  LOCATION:  [facility]                         FACILITY:  [facility]  PHYSICIAN:  Homero Fellers, MD   DATE OF BIRTH:  12-11-1931  DATE OF ADMISSION:  01/25/2011 DATE OF DISCHARGE:                             HISTORY & PHYSICAL   PRIMARY CARE PHYSICIAN:  [facility].  CHIEF COMPLAINT:  Right leg pain and ambulatory difficulty.  HISTORY OF PRESENT ILLNESS:  This is a 77 year old gentleman from [place] who was brought to the hospital by daughter because of pain in his right knee and ankle and inability to walk for the past 1-2 days. The patient has been following with physician at [facility], has been recently diagnosed with peripheral vascular disease involving both legs. The patient is supposed to follow with vascular service but he is yet to get an appointment.  I spoke to patient, to his daughter who served as interpreter.  The patient was asked to walk in the emergency room but he could not walk unaided.  According to his daughter previously he was able to walk without any problems.  The patient denies any chest pain or shortness of breath.  In the emergency room, his blood pressure was quite high ranging 178/106 to 194/104.  The patient has no fever, cough, nausea, vomiting or diaphoresis, also has no headache.  PAST MEDICAL HISTORY:  High blood pressure, diabetes mellitus, atrial fibrillation, ventricular hypertrophy, coronary artery disease.  PAST SURGICAL HISTORY:  CABG.  SOCIAL HISTORY:  No smoking, alcohol or drugs.  He lives with daughter.  REVIEW OF SYSTEMS:  A 10-point review of systems negative except as above.  PHYSICAL EXAMINATION:  VITAL SIGNS:  Blood pressure 194/104, pulse 86, respirations 20, temperature is 99.6. GENERAL:  The patient is comfortable in no acute distress. HEENT:  Pallor.  Extraocular muscles are intact. NECK:  Supple.  No JVD, adenopathy or  thyromegaly. LUNGS:  Clear breath sounds bilaterally to auscultation.  No wheezing. No crackles. HEART:  S1 and S2.  No murmurs, rubs or gallops. ABDOMEN:  Full, soft, nontender.  Bowel sounds present.  No masses. EXTREMITIES:  No edema, clubbing or cyanosis.  He has very reduced pulses involving dorsalis pedis and posterior tibial arteries on both legs. SKIN:  No rash or lesion. PSYCHIATRIC:  Normal mood and affect. LYMPHATICS:  No lymph gland swelling.  LABORATORY FINDINGS:  Potassium is 5.4, sodium 137, BUN 20, creatinine 1.35.  White count 7.5, hemoglobin 14, platelet count 135, INR 1.97. The patient is on Coumadin.  MEDICATIONS: 1. Coumadin 7 mg daily. 2. Cardizem 60 mg b.i.d. 3. Digoxin 0.25 mg daily. 4. Quinazide 20/12.5 one tablet daily. 5. Pravastatin 10 mg daily.  IMAGING:  Lumbar spine x-ray showed widespread chronic lumbar disk degeneration and atherosclerosis.  ASSESSMENT:  A 77 year old gentleman admitted with; 1. Accelerated hypertension. 2. Ambulatory difficulty. 3. Severe peripheral vascular disease. 4. Diabetes mellitus fairly controlled, glucose of 148. 5. Mild hyperkalemia  PLAN:  Admit to telemetry.  Optimize blood pressure control.  Start physical therapy and provide ambulatory exercise.  Check digoxin level. Continue other management and can probably be discharged in 1-2 days if blood pressure is  better controlled and ambulatory is improved.     Homero Fellers, MD     FA/MEDQ  D:  01/26/2011  T:  01/26/2011  Job:  [id number]  Electronically Signed by Homero Fellers  on 01/27/2011 02:46:28 AM

## 2011-01-28 LAB — BASIC METABOLIC PANEL
CO2: 26 mEq/L (ref 19–32)
Chloride: 101 mEq/L (ref 96–112)
Creatinine, Ser: 1.44 mg/dL — ABNORMAL HIGH (ref 0.50–1.35)

## 2011-01-28 LAB — GLUCOSE, CAPILLARY
Glucose-Capillary: 159 mg/dL — ABNORMAL HIGH (ref 70–99)
Glucose-Capillary: 122 mg/dL — ABNORMAL HIGH (ref 70–99)
Glucose-Capillary: 127 mg/dL — ABNORMAL HIGH (ref 70–99)

## 2011-01-28 LAB — HEMOGLOBIN A1C
Hgb A1c MFr Bld: 7.3 % — ABNORMAL HIGH (ref <5.7)
Mean Plasma Glucose: 163 mg/dL — ABNORMAL HIGH (ref <117)

## 2011-01-28 LAB — CBC
MCV: 90.5 fL (ref 78.0–100.0)
Platelets: 140 10*3/uL — ABNORMAL LOW (ref 150–400)
RBC: 4.32 MIL/uL (ref 4.22–5.81)
RDW: 13.5 % (ref 11.5–15.5)
WBC: 6.7 10*3/uL (ref 4.0–10.5)

## 2011-01-28 LAB — PROTIME-INR: INR: 3.26 — ABNORMAL HIGH (ref 0.00–1.49)

## 2011-01-29 LAB — BASIC METABOLIC PANEL
CO2: 25 mEq/L (ref 19–32)
Calcium: 9.4 mg/dL (ref 8.4–10.5)
GFR calc Af Amer: >60 mL/min (ref >60)
GFR calc non Af Amer: 53 mL/min — ABNORMAL LOW (ref >60)
Sodium: 137 mEq/L (ref 135–145)

## 2011-01-29 LAB — GLUCOSE, CAPILLARY
Glucose-Capillary: 173 mg/dL — ABNORMAL HIGH (ref 70–99)
Glucose-Capillary: 160 mg/dL — ABNORMAL HIGH (ref 70–99)

## 2011-01-29 LAB — CBC
HCT: 42.2 % (ref 39.0–52.0)
Platelets: 161 10*3/uL (ref 150–400)
RDW: 13.7 % (ref 11.5–15.5)
WBC: 6.1 10*3/uL (ref 4.0–10.5)

## 2011-01-29 LAB — PROTIME-INR
INR: 2.38 — ABNORMAL HIGH (ref 0.00–1.49)
Prothrombin Time: 26.4 seconds — ABNORMAL HIGH (ref 11.6–15.2)

## 2011-01-30 DIAGNOSIS — I4891 Unspecified atrial fibrillation: Secondary | ICD-10-CM

## 2011-01-30 LAB — CBC
MCH: 30.7 pg (ref 26.0–34.0)
MCHC: 34.5 g/dL (ref 30.0–36.0)
RDW: 13.5 % (ref 11.5–15.5)

## 2011-01-30 LAB — GLUCOSE, CAPILLARY
Glucose-Capillary: 161 mg/dL — ABNORMAL HIGH (ref 70–99)
Glucose-Capillary: 139 mg/dL — ABNORMAL HIGH (ref 70–99)
Glucose-Capillary: 185 mg/dL — ABNORMAL HIGH (ref 70–99)

## 2011-01-30 LAB — CARDIAC PANEL(CRET KIN+CKTOT+MB+TROPI)
CK, MB: 3.3 ng/mL (ref 0.3–4.0)
Relative Index: 2.3 (ref 0.0–2.5)
Troponin I: <0.3 ng/mL (ref <0.30)

## 2011-01-30 LAB — BASIC METABOLIC PANEL
BUN: 28 mg/dL — ABNORMAL HIGH (ref 6–23)
CO2: 24 mEq/L (ref 19–32)
GFR calc non Af Amer: 53 mL/min — ABNORMAL LOW (ref >60)
Glucose, Bld: 258 mg/dL — ABNORMAL HIGH (ref 70–99)
Potassium: 4.4 mEq/L (ref 3.5–5.1)

## 2011-01-30 LAB — PROTIME-INR
INR: 1.69 — ABNORMAL HIGH (ref 0.00–1.49)
Prothrombin Time: 20.2 seconds — ABNORMAL HIGH (ref 11.6–15.2)

## 2011-01-30 LAB — MAGNESIUM: Magnesium: 2.2 mg/dL (ref 1.5–2.5)

## 2011-01-31 ENCOUNTER — Observation Stay (HOSPITAL_COMMUNITY): Payer: Medicaid Other

## 2011-01-31 LAB — GLUCOSE, CAPILLARY: Glucose-Capillary: 164 mg/dL — ABNORMAL HIGH (ref 70–99)

## 2011-01-31 LAB — PROTIME-INR
INR: 1.36 (ref 0.00–1.49)
Prothrombin Time: 17 seconds — ABNORMAL HIGH (ref 11.6–15.2)

## 2011-01-31 LAB — BASIC METABOLIC PANEL
BUN: 32 mg/dL — ABNORMAL HIGH (ref 6–23)
Calcium: 9.2 mg/dL (ref 8.4–10.5)
Creatinine, Ser: 1.28 mg/dL (ref 0.50–1.35)
GFR calc Af Amer: >60 mL/min (ref >60)

## 2011-01-31 LAB — CBC
HCT: 39.2 % (ref 39.0–52.0)
Platelets: 167 10*3/uL (ref 150–400)
RDW: 13.4 % (ref 11.5–15.5)
WBC: 5.8 10*3/uL (ref 4.0–10.5)

## 2011-01-31 LAB — HEPARIN LEVEL (UNFRACTIONATED): Heparin Unfractionated: 0.19 IU/mL — ABNORMAL LOW (ref 0.30–0.70)

## 2011-01-31 MED ORDER — IOHEXOL 180 MG/ML  SOLN: 10.0000 mL | Freq: Once | INTRAMUSCULAR | Status: AC | PRN

## 2011-01-31 NOTE — Consult Note (Addendum)
[name], [name]             ACCOUNT NO.:  [id number]  MEDICAL RECORD NO.:  [id number]  LOCATION:  3705                         FACILITY:  [facility]  PHYSICIAN:  Quita Skye. Hart Rochester, M.D.  DATE OF BIRTH:  Jan 22, 1932  DATE OF CONSULTATION:  01/27/2011 DATE OF DISCHARGE:                                CONSULTATION   CHIEF COMPLAINT:  Right leg pain.  HISTORY OF PRESENT ILLNESS:  The patient is a 77 year old gentleman from [place] who was admitted on January 25, 2011, with right leg pain and difficulty ambulating.  At the time of his admission, his daughter stated that he was having 1-2 days history of right knee and ankle pain with inability to walk.  He had also had recent diagnosis of peripheral vascular disease.  In speaking with the patient today through interpreter, the patient states that this event happened 20 days ago. He had pain in the right low back, which went into his hip and down to his leg and foot.  There were some numbness associated with this, but this has improved.Marland Kitchen  He states that 3 years ago, he was told he had block arteries close to hips, but denied any stents or other interventions.  He states if he stands on his feet for more than 2 hours, he gets the pain in his leg.  He denies trauma or precipitating event to the pain in back and leg.  He denies previous claudication before 20 days ago that he was able to do all of his normal activities and denied any cramping and the pain.  He states that he has pain in the right hip sometimes which is improved with lying off the hip.  It was difficult to ascertain whether indeed had rest or night pain.  He also stated that his left leg "had given him more trouble in the past than the right."  Since he has been admitted have been able to stand and walk short distances on this leg.  PAST MEDICAL HISTORY:  Significant for: 1. Atrial fibrillation. 2. Diabetes. 3. Hypertension. 4. Coronary artery disease, he is status post  CABG. 5. Hypertension.  He denies any surgical interventions on either lower     extremity.  ALLERGIES:  No known drug allergies.  MEDICATIONS:  He is on Coumadin, digoxin, diltiazem, hydralazine, insulin sliding scale, pravastatin.  Social history and family history are as in history and physical.  He is a nonsmoker and denies any drug or alcohol abuse.  He lives with his daughter.  His 10-point review of systems was negative except for as above.  He denied any chest pain, shortness of breath, ulcers, or other issues with his lower extremities.  Labs today showed a PT/INR of 29.2 and 2.71.  His BUN and creatinine is 29 and 1.47 with a GFR of 47.  His H and H is 12.7 and 37.5 with a platelet count of 135.  PHYSICAL EXAMINATION:  VITAL SIGNS:  He is afebrile with temperature of 97.5, pulse rate is 61, respirations 18, systolic blood pressure is 134/78. GENERAL:  This is a well-developed and well-nourished gentleman in no acute distress.  He was able to stand and walk over to the  bed without limping. SKIN:  He has some chronic skin changes over the bilateral lower extremities from the calf down.  He has no ulcers or other nonhealing wounds or cellulitis in either lower extremity. LUNGS:  Clear with few rhonchi throughout. HEART:  Rate and rhythm is regular. ABDOMEN:  Soft.  He had normoactive bowel sounds and nontender. EXTREMITIES:  Bilateral lower extremities are warm and well perfused with equal temperature.  He stated that along the shin and foot on the right was slightly less sensation than the left.  He had good motion and bilateral lower extremities with 5/5 strength.  He had 3+ femoral pulses palpable bilaterally.  His popliteal pulses were palpable.  He had a DP and PT per Doppler on the right and a PT and peroneal per Doppler on the left.  IN-HOUSE STUDIES:  He had ABIs done, which showed 0.52 on the right and 0.59 on the left.  MRI of the lumbar spine was also done,  which showed a large disk protrusion at L3-4 into the right lateral process with compression of the right L4 nerve root as well as other disk bulges in the lumbar spine.  ASSESSMENT AND PLAN:  Right lower extremity in this patient with known peripheral vascular disease who also has a L4 new nerve root compression secondary to a large disk protrusion at L3-4.  It is difficult to know if this pain is secondary to his peripheral vascular disease versus herniated disk.  The patient is to be evaluated by Neurosurgery and it they do not think that this pain is coming from his herniated disk, then we can proceed with an angiogram once his INR is less than 1.5 and this can be done as early next week or as an outpatient.  We will hold his Coumadin and repeat labs over the weekend, then on Monday morning.     Della Goo, PA-C   ______________________________ Quita Skye Hart Rochester, M.D.    RR/MEDQ  D:  01/27/2011  T:  01/28/2011  Job:  [id number]  Electronically Signed by Della Goo PA on 01/31/2011 11:08:32 AM Electronically Signed by Josephina Gip M.D. on 02/02/2011 09:04:33 AM

## 2011-02-01 LAB — CBC
HCT: 42.6 % (ref 39.0–52.0)
MCV: 88.4 fL (ref 78.0–100.0)
Platelets: 200 10*3/uL (ref 150–400)
RBC: 4.82 MIL/uL (ref 4.22–5.81)
WBC: 9.2 10*3/uL (ref 4.0–10.5)

## 2011-02-01 LAB — GLUCOSE, CAPILLARY
Glucose-Capillary: 205 mg/dL — ABNORMAL HIGH (ref 70–99)
Glucose-Capillary: 231 mg/dL — ABNORMAL HIGH (ref 70–99)

## 2011-02-01 LAB — PROTIME-INR: INR: 1.18 (ref 0.00–1.49)

## 2011-02-02 NOTE — Discharge Summary (Signed)
[name], [name]             ACCOUNT NO.:  [id number]  MEDICAL RECORD NO.:  [id number]  LOCATION:  3705                         FACILITY:  [facility]  PHYSICIAN:  Thad Ranger, MD       DATE OF BIRTH:  1931/09/11  DATE OF ADMISSION:  01/25/2011 DATE OF DISCHARGE:                        DISCHARGE SUMMARY - REFERRING   PRIMARY CARE PHYSICIAN:  [facility].  CARDIOLOGIST:  Doylene Canning. Ladona Ridgel, MD.  DISCHARGE DIAGNOSES: 1. Atrial fibrillation with rapid ventricular response, now improved     on anticoagulation with Coumadin. 2. Hypotension. 3. Right lower extremity pain, improved. 4. Herniated displaced disk towards L3-L4 on Glenn right. 5. Severe peripheral vascular disease. 6. Diabetes mellitus. 7. Accelerated hypertension with systolic blood pressure 194 at Glenn     time of admission and diastolic 104.  CONSULTATIONS: 1. Vascular Surgery, Dr. Josephina Gip. 2. Neurosurgery, Dr. Coletta Memos. 3. Cardiology, Dr. Marca Ancona. DISCHARGE MEDICATIONS: 1. Hydrocodone/APAP 5/325 mg 1-2 tablets p.o. every 6 hours as needed     for pain. 2. Toprol XL 25 mg p.o. b.i.d. 3. Coumadin 5 mg one and half tablets p.o. daily. 4. Lisinopril/hydrochlorothiazide 20/12.5 mg p.o. daily. 5. Pravastatin 10 mg p.o. daily at bedtime. 6. Glipizide 5 mg daily.  BRIEF HISTORY OF PRESENT ILLNESS:  [name] is a 77 year old male from [place], who was brought to Glenn hospital by Glenn daughter because of pain in his right lower extremity and ability to walk over Glenn past 1-2 days prior to admission.  [name] has been following with physician at [facility] and was recently diagnosed with peripheral vascular disease involving both legs.  He was supposed to follow up with Vascular Service but he was not able to get an appointment yet.  [name] could not walk unaided in Glenn emergency room and according to his daughter, previously he was able to walk without any problem.  In Glenn emergency  room, his blood pressure was quite high, ranging from 178/106 to 194/104.  He was admitted for further workup.  RADIOLOGICAL DATA:  Ankle x-ray on July 17; right degenerative change and evidence of peripheral vascular disease of Glenn right ankle, no acute fracture or dislocation.  Lumbar spine x-ray on July 17; widespread chronic lumbar disk degeneration, atherosclerosis, no acute osseous abnormality in Glenn lumbar spine.  MRI of Glenn lumbar spine showed one largest soft disk protrusion at L3-L4, asymmetric into Glenn right lateral recess and right neural foramen, discrete compression of Glenn right L4 nerve root in Glenn lateral recess.  I suspect this is a source of [name]'s acute symptoms.  Disk protrusion at L2-L3 into Glenn left neural foramen with slight deviation of Glenn left L2 nerve lateral to Glenn neural foramen, broad-based disk bulges at L4-L5 and L5-S1 with left foraminal stenosis at L5-S1.  Moderate compression of Glenn thecal sac at L3-L4 and L4-L5.  Lumbosacral epidural injection at L4-L5 was done on January 31, 2011.  2-D echo on July 18 showed EF of 60-65%, normal wall motion.  No regional wall motion abnormalities.  BRIEF HOSPITALIZATION COURSE:  [name] is a 77 year old male who was admitted with right lower extremity pain and inability to ambulate. 1. Right lower extremity pain with  inability to ambulate.  [name]     was admitted to Glenn medical floor.  Given his history of peripheral     vascular disease, he had ABIs done in March 2012 which had shown     ABI of 0.59 on Glenn left and 0.52 on Glenn right lower extremity ABIs.     Vascular Surgery was also consulted.  However, did not feel that he     needs any emergent angiogram at this point, however, they will     follow up per Glenn appointment given below to get an angiogram of     [name].  MRI of Glenn lumbar spine was done which did show large     soft disk protrusion at L3-L4 and compression of Glenn right L4  nerve     root and Glenn lateral recess.  Neurosurgery was consulted, and Glenn     Villarreal was seen by Dr. Coletta Memos.  He underwent ESI injection     on January 31, 2011 with improved symptoms.  Physical therapy has seen     [name] and feels that home health will be a feasible option.     His daughter had expressed some concerns over Glenn skilled nursing     facility or rehab place given Glenn language barrier.  Home PT/OT     will be arranged prior to Glenn discharge home. 2. Atrial fibrillation with RVR.  On July 22, [name] was seen in     atrial fibrillation with RVR.  [name] was started on Cardizem     drip at Glenn time and continued on digoxin.  Cardiology service was     also consulted and [name] was seen by Dr. Marca Ancona.  Per     Dr. Alford Highland recommendation, Cardizem drip was discontinued and Glenn     Villarreal was started on beta-blocker for blood pressure control and     atrial fibrillation.  He is currently tolerating Toprol XL 25 mg     b.i.d. and Coumadin will be restarted. 3. Diabetes mellitus, diet controlled.  [name] is not on any oral     hypoglycemics as well outpatient.  He was started on Lantus and     sliding scale insulin while inpatient.  HbA1c was 7.3 with Glenn mean     plasma glucose of 163.  He refused Glenn Lantus and sliding scale     insulin while inpatient.  We will start him on glipizide low does     at Glenn time of Glenn discharge.  DISCHARGE FOLLOWUP:  With Dr. Hart Rochester on February 15, 2011 at 1:00 p.m., Dr. Franky Macho in 2-3 weeks, and Dr. Ladona Ridgel in 7-10 days of discharge regarding Glenn Coumadin and his atrial fibrillation.  Discharge time 35 minutes.     Thad Ranger, MD     RR/MEDQ  D:  02/01/2011  T:  02/01/2011  Job:  [id number]  cc:   Doylene Canning. Ladona Ridgel, MD Coletta Memos, M.D. Quita Skye Hart Rochester, M.D.  Electronically Signed by Andres Labrum Ileana Chalupa  on 02/02/2011 01:03:40 PM

## 2011-02-04 NOTE — Consult Note (Addendum)
[name], FINIGAN NO.:  [id number]  MEDICAL RECORD NO.:  [id number]  LOCATION:  3705                         FACILITY:  [facility]  PHYSICIAN:  Marca Ancona, MD      DATE OF BIRTH:  May 15, 1932  DATE OF CONSULTATION:  01/30/2011 DATE OF DISCHARGE:                                CONSULTATION   PRIMARY CARDIOLOGIST:  Previously Dr. Jerral Bonito.  Dr. Ladona Ridgel also saw the patient on October 07, 2010.  CHIEF COMPLAINT:  Right leg pain.  REASON FOR CONSULTATION:  Atrial fibrillation and wide complex rhythm.  HISTORY OF PRESENT ILLNESS:  [name] is a 77 year old gentleman with a history of CAD, status post CABG, chronic atrial fibrillation, diabetes mellitus who was from [place] and does not speak [place].  He was admitted with right leg pain.  He was seen in consultation by Dr. Franky Macho  and Dr. Hart Rochester.  He has previously been followed by Dr. Myrtis Ser, but was recently sent to Dr. Ladona Ridgel by his PCP for question of peripheral vascular disease with decreased ABIs.  Dr. Candie Chroman consultation note alludes to possible PV angiogram and Neurosurgery connected to the root of his lower extremity pain and weakness. However, the patient was subsequently diagnosed with herniated disk at L3-L4.  He has been maintained on telemetry with atrial fibrillation with varying rate, particularly high today in the 120s-140s.  He also was demonstrated wide complex rhythm approximately 100 beats per minute. His blood pressure was also elevated.  With the assistance of his daughter to translate, the patient denies any chest pain, but did feel palpitations yesterday.  Enzymes have been negative.  PAST MEDICAL HISTORY: 1. CAD, status post CABG in 2003. 2. Chronic atrial fibrillation. 3. Diabetes mellitus. 4. Normal LV function by echo January 26, 2011, with mild mitral     regurgitation and mild to moderate tricuspid regurgitation. 5. Hypertension. 6. Peripheral vascular disease.  Per Dr.  Lubertha Basque office in March     2012, the patient has noncompressible flow on ABI on the right and     ABI of 0.59 on the left.  The patient was offered  the option of     angiogram, but it was still considering.  OUTPATIENT MEDICATIONS:  Lisinopril/hydrochlorothiazide, Coumadin, diltiazem, digoxin, and pravastatin.  INPATIENT MEDICATIONS: 1. Digoxin 0.25 mg daily. 2. Diltiazem 120 mg daily, but has been on IV today. 3. Heparin per pharmacy. 4. Sliding scale insulin scale. 5. MiraLAX. 6. Pravachol.  ALLERGIES:  No known drug allergies.  SOCIAL HISTORY:  The patient is from [place], lives with his daughter. He does not currently smoke or drink alcohol.  FAMILY HISTORY:  Positive for CVA in his mother.  The patient is otherwise uncertain.  REVIEW OF SYSTEMS:  All other systems reviewed and otherwise negative.  LABORATORY DATA:  WBC 7.7, hemoglobin 14.4, hematocrit 41.2, platelet count 173.  Sodium 133, potassium 4.4, chloride 94, CO2 of 24, glucose 258, BUN 28, creatinine 1.31.  TSH was elevated with free T4 and T3 were normal.  Hemoglobin A1c was 7.3.  Cardiac enzymes were negative x2.  He did have some elevation his MB to 4.1 several days ago, but negative troponin x3 at that  time. Digoxin level was  0.9.  RADIOLOGIC STUDIES: 1. MRI of the L-spine showed large soft disk protrusion at L3-L4,     asymmetric into right lateral recess and right neural foramen.     Discrete compression of the L4 nerve root into lateral recess,     suspected source of the patient's acute symptoms.  See report for     full details. 2. Telemetry: Atrial fibrillation occasional RVR with occasional runs     of wide complex beats with the rate less than 100 with     idioventricular rhythm.  PHYSICAL EXAMINATION:  VITAL SIGNS:  Temperature 97.9, pulse 71, respirations 19, blood pressure 202/71, pulse ox 97% on room air. GENERAL:  A pleasant well-appearing white male in no acute distress. HEENT:   Normocephalic, atraumatic with extraocular movements intact. Clear sclerae.  Nares are without discharge. NECK:  Supple without carotid bruit. HEART:  Auscultation of the heart reveals irregular rhythm, with tachycardic rate without murmurs, rubs, or gallops. LUNGS:  Clear to auscultation bilaterally without wheezes, rales, or rhonchi. ABDOMEN:  Soft, nontender, nondistended with positive bowel sounds. EXTREMITIES:  No cyanosis, clubbing, or edema on his extremities. NEUROLOGICAL:  He is alert and oriented x3.  ASSESSMENT/PLAN:  The patient was seen and examined by Dr. Shirlee Latch and myself. 1. This is a 77 year old gentleman with a history of coronary artery     disease, status post coronary artery bypass graft, chronic atrial     fibrillation, diabetes, and peripheral vascular disease who     presented to the [facility] with complaints of right leg     pain, subsequently was found to have a herniated disk.  We are     asked to see him regarding atrial fibrillation as well as wide     complex rhythm on telemetry. 2. Atrial fibrillation.  He has had episodes of RVR, particularly     faster beats today.  Last several nights ago of note, he did have     mild bradycardia while sleeping.  He has also had brief runs of     idioventricular rhythm.  It is not clearly aberrant, it is slow     rhythm with narrow complex rhythm at times.  We will recommend     continued diltiazem drip.  We will discontinue his digoxin and     instead use Toprol 25 mg b.i.d. given his hypertension, using the     extended-release form to hopefully get long lasting coverage.     Eventual transitional will be made back to diltiazem CD. 3. Hypertension.  Systolic blood pressure is quite high. As per #1, we     will discontinue digoxin, start Toprol.  We will also restart his     ACE inhibitor at a lower doses.  His creatinine seems to be at     baseline at this point.  Previously during the hospitalization,  his     creatinine of 1.47. 4. Anticoagulation.  Coumadin is on hold for steroid injection on January 31, 2011.  We recommend to restart Coumadin after  when okay with     Interventional Radiology.  We will continue to follow up with you.  Thank you for the opportunity to participate in the care of this patient.     Ronie Spies, P.A.C.   ______________________________ Marca Ancona, MD    DD/MEDQ  D:  01/30/2011  T:  01/31/2011  Job:  [id number]  cc:   Tinnie Gens  Sedonia Small, MD, [facility]  Electronically Signed by Ronie Spies  on 02/04/2011 06:01:01 PM Electronically Signed by Marca Ancona MD on 03/26/2011 10:13:46 PM

## 2011-02-07 ENCOUNTER — Encounter: Payer: Self-pay | Admitting: Internal Medicine

## 2011-02-07 NOTE — Consult Note (Signed)
[name], [name]             ACCOUNT NO.:  [id number]  MEDICAL RECORD NO.:  [id number]  LOCATION:  3705                         FACILITY:  [facility]  PHYSICIAN:  Coletta Memos, M.D.     DATE OF BIRTH:  [birth year]  DATE OF CONSULTATION: DATE OF DISCHARGE:                                CONSULTATION   INDICATIONS: 1. Right lower extremity pain. 2. Herniated displaced disk L3-4 on the right.  HISTORY OF PRESENT ILLNESS:  [name] was admitted to [facility] on January 25, 2011, secondary to right lower extremity pain and problems with ambulation.  According to his daughter and my interview today he has fallen three times in the last 2 weeks.  He says that the right leg and the knee specifically essentially gives away.  He does not have good feeling there.  He told the physicians upon admission that he had been unable to walk for 1-2 days prior to his admission.  He had also recently been diagnosed with peripheral vascular disease and it was thought at that time that that may be causing his problem.  He was hypertensive in the emergency room with blood pressures from 178-194 over 100s.  He otherwise was in good health.  PAST MEDICAL HISTORY:  Significant for hypertension, diabetes mellitus, atrial fibrillation, ventricular hypertrophy and coronary artery disease.  He has undergone a coronary artery bypass graft.  He does not smoke.  He does not use alcohol. He does not use drugs.  He does live with his daughter.  He had his coronary artery bypass graft in 2003.  He is a native [place] and he has been living here with his daughter.  He has been followed at the [facility] since at least 2003, and at the time of his operation, he was felt to have class III progressive angina with severe three-vessel coronary artery disease and reduced left ventricular function.  His procedure was done by Dr. Donata Clay.  ALLERGIES:  He has no known drug allergies.  MEDICATIONS:   Medications on admission included Coumadin, digoxin, Diltiazem, hydralazine, insulin sliding scale, and pravastatin.  He denies constitutional, eyes, ears, nose or mouth, respiratory, gastrointestinal, genitourinary, skin, psychiatric, or endocrine problems.  He has had some give away weakness in his right lower extremity and has taken falls.  Otherwise, doing well overall.  PHYSICAL EXAMINATION:  VITAL SIGNS:  Temperature of 97.9, pulse 81, respiratory rate 18, blood pressure 120/69, oxygen saturation is 96. NEURO:  He is alert.  He is oriented x4 and answering all questions that his daughter poses some.  He is cooperative with me via essentially sign language, but he follows all commands that I give him by visual prompt. Speech is clear, certainly to his daughter it is fluent.  He certainly seems to be enunciating well.  Pupils equal, round, reactive to light. He has full extraocular movements.  He has full visual fields as best as I can determine.  Facies move symmetrically.  Hearing intact to voice bilaterally.  Uvula elevates in midline.  Shoulder shrug is normal. Tongue protrudes in the midline.  5/5 strength both upper and left lower extremity.  Slight weakness at best and only observed  when he was standing in the right lower extremity, specifically hip flexors, hip extensors, quadriceps, hamstrings, dorsiflexors, plantar flexors.  He is able to toe walk.  He is able to heel walk.  2+ reflexes at the knees and ankles.  No clonus.  Toes are downgoing to plantar stimulation. Proprioception intact in both the upper and lower extremities. LUNGS:  Clear.  Pulse is irregular.  He has undergone lower extremity Dopplers and has had no problems whatsoever.  He has femoral pulses which are present bilaterally in the lower extremities.  Does have a carotid bruit on his right side.  MRI shows significant spondylitic change present throughout the lumbar region.  It is worse in L3-4, L4-5,  L5-S1.  He has a large herniated disk at L3-4 eccentric to the right side with fragment that is migrated caudally.  He has severe degenerative changes at the disk space at L4-5, L3-4, also some degenerative changes present at L2-3.  He is mildly stenotic at L2-3.  He has a disk herniation at L3-4 on the right side, significant facet arthropathy, and foraminal narrowing on the right side, some present on the left, right is worse than left.  Could see a free fragment of disk caudal to the disk space on the right side.  He also has foraminal narrowing and facet hypertrophy on the right side at L4-5.  According to his daughter, he has never had an operation on his back.  Given these findings it was my believe that he would benefit from a lumbar diskectomy at L3-4 secondary to the disk herniation.  I do not believe he would need anything done at L4-5 because this was fairly acute in its onset.  L4-5 normal, it is not, but he is 64, he has been doing well and has not been speaking about any problems whatsoever until the last few weeks.  Mr. Dewolfe at the hearing what I have said would like to pursue an epidural spinal injection.  I think that is also reasonable while I do not think it is as effective, there are no guarantees with surgery either.  I will therefore go ahead and arrange him to undergo an epidural steroid injection this coming Monday.  He does understand that he needs to have surgery and will have to stop the Coumadin once more and then proceed but that in all honesty should never be the determining factor with regards to decision to or not to have surgery.  I spoke to his daughter and he at length and the plan that we have is for him is to try to inject and he will continue with Physical Therapy.          ______________________________ Coletta Memos, M.D.     KC/MEDQ  D:  01/28/2011  T:  01/29/2011  Job:  [id number]  Electronically Signed by Coletta Memos M.D. on  02/07/2011 03:10:00 PM

## 2011-02-14 ENCOUNTER — Encounter: Payer: Self-pay | Admitting: Vascular Surgery

## 2011-02-14 ENCOUNTER — Encounter: Payer: Self-pay | Admitting: Internal Medicine

## 2011-02-15 ENCOUNTER — Ambulatory Visit (INDEPENDENT_AMBULATORY_CARE_PROVIDER_SITE_OTHER): Payer: Medicaid Other | Admitting: Vascular Surgery

## 2011-02-15 ENCOUNTER — Encounter: Payer: Self-pay | Admitting: Vascular Surgery

## 2011-02-15 VITALS — BP 153/83 | HR 106 | Resp 20 | Ht 67.0 in | Wt 164.1 lb

## 2011-02-15 DIAGNOSIS — I70219 Atherosclerosis of native arteries of extremities with intermittent claudication, unspecified extremity: Secondary | ICD-10-CM

## 2011-02-15 DIAGNOSIS — M79609 Pain in unspecified limb: Secondary | ICD-10-CM

## 2011-02-15 NOTE — Progress Notes (Signed)
Subjective:     Patient ID: [name], male   DOB: [birth year], 77 y.o.   MRN: [id number]  HPI this 77 year old patient who does not speaking which is accompanied by his daughter. He was seen by me in consultation and [facility] last month for pain in the right lower extremity. He was found to have a herniated disc at L3-L4 level and received an epidural injection. The right leg pain has been improving since that time. injection was done on 01/31/2011. He has a history of bilateral calf claudication symptoms dating back several months he has noticed her rest pain nonhealing ulcers infection or gangrene. He is able to walk about 50 yards before he must stop and rest. He also has a history of coronary artery disease having undergone coronary artery bypass grafting in 1995. The saphenous vein was removed from the right leg for that surgery. His vascular evaluation during his recent hospitalization revealed ABIs of roughly 0.5 bilaterally.  Past Medical History  Diagnosis Date  . Atrial fibrillation   . Hypotension   . HNP (herniated nucleus pulposus), lumbar   . PVD (peripheral vascular disease)   . Diabetes mellitus   . CAD (coronary artery disease)   . Ventricular hypertrophy     History  Substance Use Topics  . Smoking status: Former Smoker -- 1.0 packs/day for 30 years    Types: Cigarettes    Quit date: 02/15/1991  . Smokeless tobacco: Not on file  . Alcohol Use: No    No family history on file.  Allergies  Allergen Reactions  . Percocet (Oxycodone-Acetaminophen)     Current outpatient prescriptions:digoxin (LANOXIN) 0.25 MG tablet, Take 250 mcg by mouth daily.  , Disp: , Rfl: ;  diltiazem (CARDIZEM) 60 MG tablet, Take 60 mg by mouth 2 (two) times daily.  , Disp: , Rfl: ;  lisinopril-hydrochlorothiazide (PRINZIDE,ZESTORETIC) 20-12.5 MG per tablet, Take 1 tablet by mouth daily.  , Disp: , Rfl: ;  pravastatin (PRAVACHOL) 10 MG tablet, Take 10 mg by mouth daily.  , Disp: , Rfl:    warfarin (COUMADIN) 5 MG tablet, Take 5 mg by mouth daily.  , Disp: , Rfl: ;  glipiZIDE (GLUCOTROL) 5 MG tablet, Take 5 mg by mouth 2 (two) times daily before a meal.  , Disp: , Rfl: ;  HYDROcodone-acetaminophen (NORCO) 5-325 MG per tablet, Take 1 tablet by mouth every 6 (six) hours as needed.  , Disp: , Rfl: ;  metoprolol succinate (TOPROL-XL) 25 MG 24 hr tablet, Take 25 mg by mouth daily.  , Disp: , Rfl:   Filed Vitals:   02/15/11 1400  Height: 5\' 7"  (1.702 m)  Weight: 164 lb 1.6 oz (74.435 kg)    Body mass index is 25.70 kg/(m^2).        Review of Systems he denies chest pain dyspnea on exertion PND orthopnea.    Objective:   Physical Exam blood pressure 153/83 heart rate 106 respirations 20 He is alert and oriented x3 in no apparent distress Chest is clear with no rhonchi or wheezing Cardiovascular exam is regular rhythm no murmurs. Carotid pulses are 3+ with no bruits. Abdomen soft nontender with no masses He has 3+ femoral pulses bilaterally with absent popliteal and distal pulses. There is some darkening of the skin below the knee but no active ulcers infection or gangrene.     We attempted to repeat arterial Dopplers today but patient refused.     Assessment:     stable calf claudication  secondary to bilateral superficial femoral occlusions. Patient is being actively treated for a herniated disc in the lumbar spine. Plan:    #1 continue treatment of his lumbar disc disease #2 return in 6 months with repeat large hemi-arterial Dopplers in our office. If symptoms worsen in the interim will be in touch with our office. #3 patient's daughter to return with him in 6 months to serve as an interpreter.

## 2011-04-28 LAB — CBC
MCHC: 33.4
MCV: 88.4
Platelets: 168
RDW: 14.2 — ABNORMAL HIGH
WBC: 8.4

## 2011-04-28 LAB — DIFFERENTIAL
Basophils Absolute: 0.1
Basophils Relative: 1
Eosinophils Absolute: 0
Lymphs Abs: 0.9
Neutrophils Relative %: 83 — ABNORMAL HIGH

## 2011-04-28 LAB — BASIC METABOLIC PANEL
BUN: 47 — ABNORMAL HIGH
Chloride: 102
Glucose, Bld: 209 — ABNORMAL HIGH
Potassium: 5.1
Sodium: 134 — ABNORMAL LOW

## 2011-08-22 ENCOUNTER — Encounter: Payer: Self-pay | Admitting: Vascular Surgery

## 2011-08-23 ENCOUNTER — Ambulatory Visit: Payer: Medicaid Other | Admitting: Vascular Surgery

## 2012-09-24 ENCOUNTER — Emergency Department (HOSPITAL_COMMUNITY): Payer: Medicaid Other

## 2012-09-24 ENCOUNTER — Encounter (HOSPITAL_COMMUNITY): Payer: Self-pay | Admitting: Emergency Medicine

## 2012-09-24 ENCOUNTER — Emergency Department (INDEPENDENT_AMBULATORY_CARE_PROVIDER_SITE_OTHER)
Admission: EM | Admit: 2012-09-24 | Discharge: 2012-09-24 | Disposition: A | Discharge status: Nursing Home (Includes ICF) | Payer: Medicaid Other | Source: Home / Self Care | Attending: Emergency Medicine | Admitting: Emergency Medicine

## 2012-09-24 ENCOUNTER — Inpatient Hospital Stay (HOSPITAL_COMMUNITY)
Admission: EM | Admit: 2012-09-24 | Discharge: 2012-09-29 | DRG: 419 | Disposition: A | Discharge status: Home / Self Care | Payer: Medicaid Other | Source: Ambulatory Visit | Attending: Internal Medicine | Admitting: Internal Medicine

## 2012-09-24 DIAGNOSIS — R269 Unspecified abnormalities of gait and mobility: Secondary | ICD-10-CM | POA: Diagnosis present

## 2012-09-24 DIAGNOSIS — I2510 Atherosclerotic heart disease of native coronary artery without angina pectoris: Secondary | ICD-10-CM | POA: Diagnosis present

## 2012-09-24 DIAGNOSIS — I739 Peripheral vascular disease, unspecified: Secondary | ICD-10-CM | POA: Diagnosis present

## 2012-09-24 DIAGNOSIS — R079 Chest pain, unspecified: Secondary | ICD-10-CM

## 2012-09-24 DIAGNOSIS — I25810 Atherosclerosis of coronary artery bypass graft(s) without angina pectoris: Secondary | ICD-10-CM

## 2012-09-24 DIAGNOSIS — I4891 Unspecified atrial fibrillation: Secondary | ICD-10-CM

## 2012-09-24 DIAGNOSIS — R0989 Other specified symptoms and signs involving the circulatory and respiratory systems: Secondary | ICD-10-CM

## 2012-09-24 DIAGNOSIS — F329 Major depressive disorder, single episode, unspecified: Secondary | ICD-10-CM

## 2012-09-24 DIAGNOSIS — J329 Chronic sinusitis, unspecified: Secondary | ICD-10-CM | POA: Diagnosis present

## 2012-09-24 DIAGNOSIS — R0600 Dyspnea, unspecified: Secondary | ICD-10-CM

## 2012-09-24 DIAGNOSIS — R0789 Other chest pain: Secondary | ICD-10-CM

## 2012-09-24 DIAGNOSIS — F3289 Other specified depressive episodes: Secondary | ICD-10-CM | POA: Diagnosis present

## 2012-09-24 DIAGNOSIS — I150 Renovascular hypertension: Secondary | ICD-10-CM | POA: Diagnosis present

## 2012-09-24 DIAGNOSIS — I10 Essential (primary) hypertension: Secondary | ICD-10-CM

## 2012-09-24 DIAGNOSIS — I517 Cardiomegaly: Secondary | ICD-10-CM | POA: Diagnosis present

## 2012-09-24 DIAGNOSIS — R2681 Unsteadiness on feet: Secondary | ICD-10-CM | POA: Diagnosis present

## 2012-09-24 DIAGNOSIS — Z7901 Long term (current) use of anticoagulants: Secondary | ICD-10-CM

## 2012-09-24 DIAGNOSIS — K8020 Calculus of gallbladder without cholecystitis without obstruction: Principal | ICD-10-CM | POA: Diagnosis present

## 2012-09-24 DIAGNOSIS — Z91199 Patient's noncompliance with other medical treatment and regimen due to unspecified reason: Secondary | ICD-10-CM

## 2012-09-24 DIAGNOSIS — J019 Acute sinusitis, unspecified: Secondary | ICD-10-CM | POA: Diagnosis present

## 2012-09-24 DIAGNOSIS — I4821 Permanent atrial fibrillation: Secondary | ICD-10-CM | POA: Diagnosis present

## 2012-09-24 DIAGNOSIS — R531 Weakness: Secondary | ICD-10-CM

## 2012-09-24 DIAGNOSIS — Z951 Presence of aortocoronary bypass graft: Secondary | ICD-10-CM

## 2012-09-24 DIAGNOSIS — R0609 Other forms of dyspnea: Secondary | ICD-10-CM

## 2012-09-24 DIAGNOSIS — E119 Type 2 diabetes mellitus without complications: Secondary | ICD-10-CM | POA: Diagnosis present

## 2012-09-24 DIAGNOSIS — Z9119 Patient's noncompliance with other medical treatment and regimen: Secondary | ICD-10-CM

## 2012-09-24 HISTORY — DX: Permanent atrial fibrillation: I48.21

## 2012-09-24 HISTORY — DX: Essential (primary) hypertension: I10

## 2012-09-24 LAB — COMPREHENSIVE METABOLIC PANEL
ALT: 31 U/L (ref 0–53)
AST: 22 U/L (ref 0–37)
Alkaline Phosphatase: 52 U/L (ref 39–117)
CO2: 24 mEq/L (ref 19–32)
GFR calc Af Amer: 62 mL/min — ABNORMAL LOW (ref >90)
GFR calc non Af Amer: 53 mL/min — ABNORMAL LOW (ref >90)
Glucose, Bld: 216 mg/dL — ABNORMAL HIGH (ref 70–99)
Potassium: 4.4 mEq/L (ref 3.5–5.1)
Sodium: 134 mEq/L — ABNORMAL LOW (ref 135–145)

## 2012-09-24 LAB — URINALYSIS, ROUTINE W REFLEX MICROSCOPIC
Bilirubin Urine: NEGATIVE
Nitrite: NEGATIVE
Specific Gravity, Urine: 1.023 (ref 1.005–1.030)
Urobilinogen, UA: 0.2 mg/dL (ref 0.0–1.0)
pH: 5.5 (ref 5.0–8.0)

## 2012-09-24 LAB — PROTIME-INR: INR: 2.44 — ABNORMAL HIGH (ref 0.00–1.49)

## 2012-09-24 LAB — CBC WITH DIFFERENTIAL/PLATELET
Basophils Absolute: 0.1 10*3/uL (ref 0.0–0.1)
Lymphocytes Relative: 22 % (ref 12–46)
Lymphs Abs: 1.6 10*3/uL (ref 0.7–4.0)
Neutro Abs: 4.6 10*3/uL (ref 1.7–7.7)
Neutrophils Relative %: 64 % (ref 43–77)
Platelets: 252 10*3/uL (ref 150–400)
RBC: 4.66 MIL/uL (ref 4.22–5.81)
RDW: 14.1 % (ref 11.5–15.5)
WBC: 7.2 10*3/uL (ref 4.0–10.5)

## 2012-09-24 LAB — DIGOXIN LEVEL: Digoxin Level: 0.5 ng/mL — ABNORMAL LOW (ref 0.8–2.0)

## 2012-09-24 LAB — URINE MICROSCOPIC-ADD ON

## 2012-09-24 LAB — LIPASE, BLOOD: Lipase: 124 U/L — ABNORMAL HIGH (ref 11–59)

## 2012-09-24 LAB — APTT: aPTT: 41 seconds — ABNORMAL HIGH (ref 24–37)

## 2012-09-24 MED ORDER — SODIUM CHLORIDE 0.9 % IV SOLN: INTRAVENOUS | Status: DC

## 2012-09-24 MED ORDER — ALBUTEROL SULFATE (5 MG/ML) 0.5% IN NEBU: 5.0000 mg | INHALATION_SOLUTION | Freq: Once | RESPIRATORY_TRACT | Status: DC

## 2012-09-24 NOTE — H&P (Signed)
PCP: none Cardiology: will be seen in April by [facility] cardiology   Chief Complaint:   Epigastric pain  HPI: [name] is a 79 y.o. male   has a past medical history of Atrial fibrillation; Hypotension; HNP (herniated nucleus pulposus), lumbar; PVD (peripheral vascular disease); Diabetes mellitus; CAD (coronary artery disease); and Ventricular hypertrophy.   Presented with  Patient   moved here from [place] since 2006 he has been non-compliant with his medications and his daughter is worried that he might be depressed. While visiting to [place] he had a suicidal attempt in 2013. He was diagnosed with situational depression but he refuses to take his medication. He has been crying out at night. Patient is straight about dying he is constantly feeling his pulse to see if his heart was still beating. Patient has been complaining about epigastric pain that has been there for the past 2-3 years but it seems to get worse recently. His lipase was slightly elevated to 125 and in emergency department US showed cholelithiasis but no biliary dilatation. No nausea or vomiting no liver function abnormalities. He reports some dizziness and sensation that he is unsteady on his feet this has been going on for few years but now it is worse.      Today he stated that he feels short of breath and having chest pain and feeling like his is going to die.  Patient is being vague with his symptoms. Family states he has been having a lot of anxiety and wants to take extra medication to help his symptoms. He's been trying to take extra Coumadin although only slightly. His daughter has taken over his medications because she is concerned he may be overmedicating himself.  Patient has history of coronary artery disease and while visiting to [place] in 2004 have had a quadruple bypass. They currently do not have primary care provider but is scheduled to see [facility] cardiology in April.   Review of  Systems:    Pertinent positives include:  chest pain, epigastric pain, abdominal pain,  shortness of breath at rest. Mild constipation  Constitutional:  No weight loss, night sweats, Fevers, chills, fatigue, weight loss  HEENT:  No headaches, Difficulty swallowing,Tooth/dental problems,Sore throat,  No sneezing, itching, ear ache, nasal congestion, post nasal drip,  Cardio-vascular:  No Orthopnea, PND, anasarca, dizziness, palpitations.no Bilateral lower extremity swelling  GI:  No heartburn, indigestion,nausea, vomiting, diarrhea, change in bowel habits, loss of appetite, melena, blood in stool, hematemesis Resp:  noNo dyspnea on exertion, No excess mucus, no productive cough, No non-productive cough, No coughing up of blood.No change in color of mucus.No wheezing. Skin:  no rash or lesions. No jaundice GU:  no dysuria, change in color of urine, no urgency or frequency. No straining to urinate.  No flank pain.  Musculoskeletal:  No joint pain or no joint swelling. No decreased range of motion. No back pain.  Psych:  No change in mood or affect. No depression or anxiety. No memory loss.  Neuro: no localizing neurological complaints, no tingling, no weakness, no double vision, no gait abnormality, no slurred speech, no confusion  Otherwise ROS are negative except for above, 10 systems were reviewed  Past Medical History: Past Medical History  Diagnosis Date  . Atrial fibrillation   . Hypotension   . HNP (herniated nucleus pulposus), lumbar   . PVD (peripheral vascular disease)   . Diabetes mellitus   . CAD (coronary artery disease)   . Ventricular hypertrophy    Past  Surgical History  Procedure Laterality Date  . Coronary artery bypass graft  2004    4 biypass     Medications: Prior to Admission medications   Medication Sig Start Date End Date Taking? Authorizing Provider  amiloride-hydrochlorothiazide (MODURETIC) 5-50 MG tablet Take 1 tablet by mouth daily.   Yes  Historical Provider, MD  digoxin (LANOXIN) 0.25 MG tablet Take 250 mcg by mouth daily.     Yes Historical Provider, MD  diltiazem (CARDIZEM) 60 MG tablet Take 60 mg by mouth 2 (two) times daily.     Yes Historical Provider, MD  fish oil-omega-3 fatty acids 1000 MG capsule Take 1 g by mouth daily.   Yes Historical Provider, MD  vitamin C (ASCORBIC ACID) 500 MG tablet Take 500 mg by mouth daily.   Yes Historical Provider, MD  warfarin (COUMADIN) 3 MG tablet Take 5.25 mg by mouth daily.   Yes Historical Provider, MD    Allergies:   Allergies  Allergen Reactions  . Percocet (Oxycodone-Acetaminophen)     Social History:  Ambulatory  cane Lives at  Home with family   reports that he quit smoking about 21 years ago. His smoking use included Cigarettes. He has a 30 pack-year smoking history. He does not have any smokeless tobacco history on file. He reports that he does not drink alcohol or use illicit drugs.   Family History: family history includes Breast cancer in his sister and Heart disease in his mother and sister.    Physical Exam: Patient Vitals for the past 24 hrs:  BP Temp Temp src Pulse Resp SpO2  09/24/12 2157 217/98 mmHg - - 71 25 99 %  09/24/12 1915 161/82 mmHg - - 79 23 98 %  09/24/12 1902 173/79 mmHg 98.8 F (37.1 C) Oral 88 - 98 %    1. General:  in No Acute distress flat affect 2. Psychological: Alert and Oriented 3. Head/ENT:   Moist  Mucous Membranes                          Head Non traumatic, neck supple                          Normal  Dentition 4. SKIN:   decreased Skin turgor,  Skin clean Dry and intact no rash 5. Heart: Regular rate and rhythm no Murmur, Rub or gallop 6. Lungs: no wheezes occasional crackles  at the bases 7. Abdomen: Soft, non-tender, Non distended 8. Lower extremities: no clubbing, cyanosis, or edema 9. Neurologically Grossly intact, moving all 4 extremities equally 10. MSK: Normal range of motion  body mass index is unknown because  there is no weight on file.   Labs on Admission:   Recent Labs  09/24/12 1920  NA 134*  K 4.4  CL 99  CO2 24  GLUCOSE 216*  BUN 32*  CREATININE 1.25  CALCIUM 9.3    Recent Labs  09/24/12 1920  AST 22  ALT 31  ALKPHOS 52  BILITOT 0.4  PROT 7.7  ALBUMIN 3.3*    Recent Labs  09/24/12 1920  LIPASE 124*    Recent Labs  09/24/12 1920  WBC 7.2  NEUTROABS 4.6  HGB 13.7  HCT 40.0  MCV 85.8  PLT 252    Recent Labs  09/24/12 1921  TROPONINI <0.30   No results found for this basename: TSH, T4TOTAL, FREET3, T3FREE, THYROIDAB,  in the last 72  hours No results found for this basename: VITAMINB12, FOLATE, FERRITIN, TIBC, IRON, RETICCTPCT,  in the last 72 hours Lab Results  Component Value Date   HGBA1C 7.3* 01/27/2011    The CrCl is unknown because both a height and weight (above a minimum accepted value) are required for this calculation. ABG No results found for this basename: phart, pco2, po2, hco3, tco2, acidbasedef, o2sat     No results found for this basename: DDIMER     Other results:  I have pearsonaly reviewed this: ECG REPORT  Rate: 90  Rhythm: A.fib ST&T Change: T wave inversion in V1  UA proteinuria  Cultures: No results found for this basename: sdes, specrequest, cult, reptstatus       Radiological Exams on Admission: Dg Chest 2 View  09/24/2012  *RADIOLOGY REPORT*  Clinical Data: Chest pain.  Weakness. Shortness of breath.  CHEST - 2 VIEW  Comparison: 09/07/2010.  Findings:  Post CABG.  Heart size top normal.  Chronic lung changes with central pulmonary vascular prominence but without frank pulmonary edema, segmental infiltrate or pneumothorax.  Calcified mildly tortuous aorta.  IMPRESSION: No acute abnormality.  Please see above.   Original Report Authenticated By: Lacy Duverney, M.D.    Ct Head Wo Contrast  09/24/2012  *RADIOLOGY REPORT*  Clinical Data: Dizziness.  Generalized weakness.  Current history of hypertension.  CT HEAD  WITHOUT CONTRAST  Technique:  Contiguous axial images were obtained from the base of the skull through the vertex without contrast.  Comparison: Unenhanced cranial CT 12/18/2006.  Findings: Moderate cortical and deep atrophy and mild to moderate cerebellar atrophy, unchanged.  Moderate to severe changes of small vessel disease of the white matter diffusely, unchanged.  Old lacunar strokes in the right basal ganglia, unchanged.  No mass lesion.  No midline shift.  No acute hemorrhage or hematoma.  No extra-axial fluid collections.  No evidence of acute infarction. No significant interval change.  No skull fracture or other focal osseous abnormality involving the skull.  Opacification of both maxillary sinuses, right greater than left, with air-fluid levels.  Opacification of scattered ethmoid air cells bilaterally.  Inspissated mucous in the right sphenoid sinus with a small fluid level.  Bilateral mastoid air cells and middle ear cavities well-aerated.  IMPRESSION:  1.  No acute intracranial abnormality. 2.  Stable moderate generalized atrophy, moderate to severe chronic microvascular ischemic changes of the white matter, and old lacunar strokes in the right basal ganglia. 3.  Acute bilateral maxillary sinusitis, right greater than left. Bilateral ethmoid sinusitis which may be acute or chronic.  Mild acute right sphenoid sinusitis.   Original Report Authenticated By: Hulan Saas, M.D.    US Abdomen Complete  09/24/2012  *RADIOLOGY REPORT*  Clinical Data:  Abdominal pain.  Elevated lipase and  BUN. Diabetic hypertensive patient.  COMPLETE ABDOMINAL ULTRASOUND  Comparison:  None.  Findings:  Gallbladder:  Echogenic shadowing in the gallbladder fossa suggestive of gallstones.  Gallbladder wall thickening of the 3.7 mm.  Per ultrasound technologist, the patient was not tender over this region during scanning.  Common bile duct:  6.5 mm proximally.  The mid and distal aspect not visualized secondary to bowel gas.   Liver:  No focal lesion identified.  Within normal limits in parenchymal echogenicity.  IVC:  Appears normal.  Pancreas:  Not visualized secondary to bowel gas.  Spleen:  8.9 cm.  Accessory splenic tissue noted.  Right Kidney:  9.1 cm. No hydronephrosis or renal mass.Mild increased echogenicity may represent  result of medical renal disease type changes.  Left Kidney:  10.7 cm.  No hydronephrosis or renal mass.Mild increased echogenicity may represent result of medical renal disease type changes.  Abdominal aorta:  Ectatic and calcified consistent with atherosclerotic type changes.  The full extent of the aorta is not imaged secondary to bowel gas.  Portions visualized with maximal AP dimension 2.8 cm.  IMPRESSION: Echogenic shadowing in the gallbladder fossa suggestive of gallstones.  Gallbladder wall thickening of the 3.7 mm.  Pancreas, portions of the abdominal aorta and mid to distal common bile duct not visualized secondary to bowel gas.  Increased echogenicity of renal parenchyma may represent result of medical renal disease type changes.   Original Report Authenticated By: Lacy Duverney, M.D.     Chart has been reviewed  Assessment/Plan  This is a 79 year old gentleman with history of coronary disease presenting with numerous complaints but most significantly chest pain or shortness of breath. He has been also have prolonged history of epigastric pain with elevated lipase now and evidence of cholelithiasis although no cholecystitis. He has been unsteady on his feet and CT scan was done showing old evidence of infarct nothing acute and evidence of sinusitis. Present on Admission:  . Chest pain - I am unable to give detailed history regarding his symptoms but given his history of coronary artery disease will admit to telemetry cycle cardiac markers obtained 2-D echo to evaluate for any wall motion abnormality. Will obtain TSH hemoglobin A1c and lipid panel in the a.m.  . HYPERTENSION, BENIGN - patient  does not take all his medications consistently will continue her diltiazem but also add enalapril given history of mild diabetes would avoid high doses of hydrochlorothiazide in this elderly patient.  Marland Kitchen FIBRILLATION, ATRIAL - rate controlled on diltiazem continued digoxin. Digoxin level was not supratherapeutic. Continue Coumadin  . DIABETES MELLITUS -apparently is not taking anything for it but watch blood sugars and put on sliding scale.   . Cholelithiases - it is not clear if patient section symptomatic from that standpoint currently there is no evidence of infection. ER has consulted general surgery who will see patient in a.m. to see if he would need at some point elective cholecystectomy.  . Sinusitis - evidence of acute sinusitis we'll cover with Augmentin  . Unsteady gait - This is not an acute change and has been going on for years is no evidence of recent CVA but a history of past CVAs evident on the CT scan. We'll make sure he is on aspirin continue Coumadin. Order PT OT evaluation to further evaluate his symptoms and needs at home. Possible depression - would recommend either psychiatry followup as an outpatient or inpatient consult. For now will initiate on Cymbalta and monitor. Also check TSH vitamin B 12 level and folate.  Prophylaxis: on coumadin, Protonix  CODE STATUS: FULL CODE   Other plan as per orders.  I have spent a total of 55 min on this admission  Marquavius Scaife 09/24/2012, 10:49 PM

## 2012-09-24 NOTE — ED Notes (Signed)
[facility] called and is on the way

## 2012-09-24 NOTE — ED Notes (Signed)
Dr. Ranae Palms aware of elevated BP

## 2012-09-24 NOTE — ED Notes (Signed)
Pt presents to ED via [facility] from [facility] for evaluation of chest pain.  Pt has been having chest pain for 2 days with dizziness and shortness of breath associated with symptoms.  Pt with hx of a-fib, a-fib on monitor.  Pt denies any pain at this time.  BP elevated upon arrival  - 240/100, hx of hypertension.

## 2012-09-24 NOTE — ED Provider Notes (Signed)
History     CSN: [id number]  Arrival date & time 09/24/12  1854   First MD Initiated Contact with Patient 09/24/12 1855      Chief Complaint  Patient presents with  . Chest Pain    (Consider location/radiation/quality/duration/timing/severity/associated sxs/prior treatment) HPI Pt is [place] and doesn't speak english. Daughter in room to serve as interpreter. Several weeks ago the pt moved back to the [place] from [place]. Over the past 2 weeks pt has had increased generalized weakness, dizziness, episodic chest and epigastric pain, headache, and agitation. Daughter states pt was on medication from depression but has not been taking them in the [place]. States he frequently states he believes he is going to die. Pt with hx of CAD s/p cabg and afib on coumadin. Pt initially eval'd at [facility] and transferred to ED for further workup.  Past Medical History  Diagnosis Date  . Atrial fibrillation   . Hypotension   . HNP (herniated nucleus pulposus), lumbar   . PVD (peripheral vascular disease)   . Diabetes mellitus   . CAD (coronary artery disease)   . Ventricular hypertrophy     Past Surgical History  Procedure Laterality Date  . Coronary artery bypass graft  2004    4 biypass    Family History  Problem Relation Age of Onset  . Heart disease Mother   . Heart disease Sister   . Breast cancer Sister     History  Substance Use Topics  . Smoking status: Former Smoker -- 1.00 packs/day for 30 years    Types: Cigarettes    Quit date: 02/15/1991  . Smokeless tobacco: Not on file  . Alcohol Use: No      Review of Systems  Constitutional: Positive for fatigue.  HENT: Negative for neck pain.   Respiratory: Positive for cough and shortness of breath.   Cardiovascular: Positive for chest pain. Negative for palpitations and leg swelling.  Gastrointestinal: Positive for nausea and abdominal pain. Negative for vomiting, diarrhea, constipation and blood in stool.  Skin: Negative for pallor,  rash and wound.  Neurological: Positive for dizziness, weakness and light-headedness. Negative for syncope and numbness.  Psychiatric/Behavioral: Positive for dysphoric mood.  All other systems reviewed and are negative.    Allergies  Percocet  Home Medications   Current Outpatient Rx  Name  Route  Sig  Dispense  Refill  . amiloride-hydrochlorothiazide (MODURETIC) 5-50 MG tablet   Oral   Take 1 tablet by mouth daily.         . digoxin (LANOXIN) 0.25 MG tablet   Oral   Take 250 mcg by mouth daily.           Marland Kitchen diltiazem (CARDIZEM) 60 MG tablet   Oral   Take 60 mg by mouth 2 (two) times daily.           . fish oil-omega-3 fatty acids 1000 MG capsule   Oral   Take 1 g by mouth daily.         . vitamin C (ASCORBIC ACID) 500 MG tablet   Oral   Take 500 mg by mouth daily.         Marland Kitchen warfarin (COUMADIN) 3 MG tablet   Oral   Take 5.25 mg by mouth daily.           BP 217/98  Pulse 71  Temp(Src) 98.8 F (37.1 C) (Oral)  Resp 25  SpO2 99%  Physical Exam  Nursing note and vitals reviewed. Constitutional: He  is oriented to person, place, and time. He appears well-developed and well-nourished. No distress.  HENT:  Head: Normocephalic and atraumatic.  Mouth/Throat: Oropharynx is clear and moist.  Eyes: EOM are normal. Pupils are equal, round, and reactive to light.  Neck: Normal range of motion. Neck supple.  Cardiovascular: Normal rate.   Irregular rhythm  Pulmonary/Chest: Effort normal and breath sounds normal. No respiratory distress. He has no wheezes. He has no rales.  Abdominal: Soft. Bowel sounds are normal.  Musculoskeletal: Normal range of motion. He exhibits no edema and no tenderness.  Neurological: He is alert and oriented to person, place, and time.  5/5 motor in all ext, sensation intact. Finger to nose bl intact  Skin: Skin is warm and dry. No rash noted. No erythema.  Psychiatric:  Flat affect    ED Course  Procedures (including critical  care time)  Labs Reviewed  CBC WITH DIFFERENTIAL - Abnormal; Notable for the following:    Basophils Relative 2 (*)    All other components within normal limits  COMPREHENSIVE METABOLIC PANEL - Abnormal; Notable for the following:    Sodium 134 (*)    Glucose, Bld 216 (*)    BUN 32 (*)    Albumin 3.3 (*)    GFR calc non Af Amer 53 (*)    GFR calc Af Amer 62 (*)    All other components within normal limits  PROTIME-INR - Abnormal; Notable for the following:    Prothrombin Time 25.4 (*)    INR 2.44 (*)    All other components within normal limits  LIPASE, BLOOD - Abnormal; Notable for the following:    Lipase 124 (*)    All other components within normal limits  URINALYSIS, ROUTINE W REFLEX MICROSCOPIC - Abnormal; Notable for the following:    Glucose, UA 250 (*)    Hgb urine dipstick TRACE (*)    Protein, ur >300 (*)    All other components within normal limits  APTT - Abnormal; Notable for the following:    aPTT 41 (*)    All other components within normal limits  DIGOXIN LEVEL - Abnormal; Notable for the following:    Digoxin Level 0.5 (*)    All other components within normal limits  URINE MICROSCOPIC-ADD ON - Abnormal; Notable for the following:    Casts HYALINE CASTS (*)    All other components within normal limits  TROPONIN I   Dg Chest 2 View  09/24/2012  *RADIOLOGY REPORT*  Clinical Data: Chest pain.  Weakness. Shortness of breath.  CHEST - 2 VIEW  Comparison: 09/07/2010.  Findings:  Post CABG.  Heart size top normal.  Chronic lung changes with central pulmonary vascular prominence but without frank pulmonary edema, segmental infiltrate or pneumothorax.  Calcified mildly tortuous aorta.  IMPRESSION: No acute abnormality.  Please see above.   Original Report Authenticated By: Lacy Duverney, M.D.    Ct Head Wo Contrast  09/24/2012  *RADIOLOGY REPORT*  Clinical Data: Dizziness.  Generalized weakness.  Current history of hypertension.  CT HEAD WITHOUT CONTRAST  Technique:   Contiguous axial images were obtained from the base of the skull through the vertex without contrast.  Comparison: Unenhanced cranial CT 12/18/2006.  Findings: Moderate cortical and deep atrophy and mild to moderate cerebellar atrophy, unchanged.  Moderate to severe changes of small vessel disease of the white matter diffusely, unchanged.  Old lacunar strokes in the right basal ganglia, unchanged.  No mass lesion.  No midline shift.  No  acute hemorrhage or hematoma.  No extra-axial fluid collections.  No evidence of acute infarction. No significant interval change.  No skull fracture or other focal osseous abnormality involving the skull.  Opacification of both maxillary sinuses, right greater than left, with air-fluid levels.  Opacification of scattered ethmoid air cells bilaterally.  Inspissated mucous in the right sphenoid sinus with a small fluid level.  Bilateral mastoid air cells and middle ear cavities well-aerated.  IMPRESSION:  1.  No acute intracranial abnormality. 2.  Stable moderate generalized atrophy, moderate to severe chronic microvascular ischemic changes of the white matter, and old lacunar strokes in the right basal ganglia. 3.  Acute bilateral maxillary sinusitis, right greater than left. Bilateral ethmoid sinusitis which may be acute or chronic.  Mild acute right sphenoid sinusitis.   Original Report Authenticated By: Hulan Saas, M.D.    US Abdomen Complete  09/24/2012  *RADIOLOGY REPORT*  Clinical Data:  Abdominal pain.  Elevated lipase and  BUN. Diabetic hypertensive patient.  COMPLETE ABDOMINAL ULTRASOUND  Comparison:  None.  Findings:  Gallbladder:  Echogenic shadowing in the gallbladder fossa suggestive of gallstones.  Gallbladder wall thickening of the 3.7 mm.  Per ultrasound technologist, the patient was not tender over this region during scanning.  Common bile duct:  6.5 mm proximally.  The mid and distal aspect not visualized secondary to bowel gas.  Liver:  No focal lesion  identified.  Within normal limits in parenchymal echogenicity.  IVC:  Appears normal.  Pancreas:  Not visualized secondary to bowel gas.  Spleen:  8.9 cm.  Accessory splenic tissue noted.  Right Kidney:  9.1 cm. No hydronephrosis or renal mass.Mild increased echogenicity may represent result of medical renal disease type changes.  Left Kidney:  10.7 cm.  No hydronephrosis or renal mass.Mild increased echogenicity may represent result of medical renal disease type changes.  Abdominal aorta:  Ectatic and calcified consistent with atherosclerotic type changes.  The full extent of the aorta is not imaged secondary to bowel gas.  Portions visualized with maximal AP dimension 2.8 cm.  IMPRESSION: Echogenic shadowing in the gallbladder fossa suggestive of gallstones.  Gallbladder wall thickening of the 3.7 mm.  Pancreas, portions of the abdominal aorta and mid to distal common bile duct not visualized secondary to bowel gas.  Increased echogenicity of renal parenchyma may represent result of medical renal disease type changes.   Original Report Authenticated By: Lacy Duverney, M.D.      1. Atypical chest pain   2. Cholelithiasis   3. Hypertension      Date: 09/24/2012  Rate: 90  Rhythm: normal sinus rhythm  QRS Axis: normal  Intervals: QRS prolonged  ST/T Wave abnormalities: nonspecific T wave changes  Conduction Disutrbances:right bundle branch block  Narrative Interpretation:   Old EKG Reviewed: New RBBB since 2012    MDM   Dr Adela Glimpse to see and admit for chest pain r/o and uncontrolled blood pressure.   Discussed with Surgery who will consult for cholelithiasis and elevated lipase       Loren Racer, MD 09/24/12 2306

## 2012-09-24 NOTE — ED Notes (Signed)
Via daughter (interpreter; [place])... Pt c/o chest pain onset 2 days Sx include: SOB, dizziness, headaches, blurry vision Daughter states he has not been taking BP meds regularly.   He is alert w/no signs of acute distress.

## 2012-09-24 NOTE — ED Provider Notes (Signed)
Chief Complaint:   Chief Complaint  Patient presents with  . Chest Pain    History of Present Illness:   [name] is a 79 year old [place] gentleman who has lived in [place] for about 4 years off-and-on and is accompanied by his daughter who serves as Equities trader. He presents today with a two-week history of complaining of just not feeling well. He endorses shortness of breath, generalized weakness, inability to walk, he is in bed most of the day, feels dizzy, has anterior chest pain off and on, cough, epigastric pain, has been depressed, upset, and agitated. He also had severe headache and has a history of high blood pressure. His blood pressure today was [phone]. He had a coronary artery bypass graft in 2004. Apparently did not have much cardiology followup since then and it appears that he has been in atrial fibrillation for at least 2 years. He's on Coumadin right now. He has not had fever, hemoptysis, vomiting, or sweats.  Review of Systems:  Other than noted above, the patient denies any of the following symptoms. Systemic:  No fever, chills, sweats, or fatigue. ENT:  No nasal congestion, rhinorrhea, or sore throat. Pulmonary:  No cough, wheezing, shortness of breath, sputum production, hemoptysis. Cardiac:  No palpitations, rapid heartbeat, dizziness, presyncope or syncope. GI:  No abdominal pain, heartburn, nausea, or vomiting. Ext:  No leg pain or swelling.  PMFSH:  Past medical history, family history, social history, meds, and allergies were reviewed and updated as needed. He has a history of diabetes, hypertension, atrial fibrillation, and peripheral vascular disease. He is on a number of medications, some of which he has gotten in [place]. He is allergic to Percocet.  Physical Exam:   Vital signs:  Pulse 97  Temp(Src) 99.9 F (37.7 C) (Oral)  Resp 18  SpO2 97% Gen:  Alert, oriented, in no distress, skin warm and dry. Eye:  PERRL, lids and conjunctivas normal.   Sclera non-icteric. ENT:  Mucous membranes moist, pharynx clear. Neck:  Supple, no adenopathy or tenderness.  No JVD. Lungs:  Clear to auscultation, no wheezes, rales or rhonchi.  No respiratory distress. Heart:  Regular rhythm.  No gallops, murmers, clicks or rubs. Chest:  No chest wall tenderness. Abdomen:  Soft, nontender, no organomegaly or mass.  Bowel sounds normal.  No pulsatile abdominal mass or bruit. Ext:  No edema.  No calf tenderness and Homann's sign negative.  Pulses full and equal. Skin:  Warm and dry.  No rash.  EKG:   Date: 09/24/2012  Rate: 87  Rhythm: atrial fibrillation  QRS Axis: right  Intervals: normal  ST/T Wave abnormalities: normal  Conduction Disutrbances:right bundle branch block  Narrative Interpretation: Atrial fibrillation and right bundle branch block.  Old EKG Reviewed: unchanged   Medications given in UCC:  He was started on normal saline at 50 mL per hour.  Assessment:  The primary encounter diagnosis was Chest pain. Diagnoses of Dyspnea, Weakness, and Depression were also pertinent to this visit.  His presenting complaints are numerous and nonspecific. He needs a thorough workup including being ruled out for cardiac disease. His atrial fibrillation appears to be old. There is no evidence of STEMI.   Plan:   1.  The following meds were prescribed:   New Prescriptions   No medications on file   2.  The patient was transferred to the emergency department via EMS in stable condition.  Reuben Likes, MD 09/24/12 [phone]

## 2012-09-24 NOTE — ED Notes (Signed)
[facility] did not have a truck

## 2012-09-25 ENCOUNTER — Encounter (HOSPITAL_COMMUNITY): Payer: Self-pay | Admitting: Physician Assistant

## 2012-09-25 DIAGNOSIS — Z01810 Encounter for preprocedural cardiovascular examination: Secondary | ICD-10-CM

## 2012-09-25 DIAGNOSIS — I4891 Unspecified atrial fibrillation: Secondary | ICD-10-CM

## 2012-09-25 DIAGNOSIS — J329 Chronic sinusitis, unspecified: Secondary | ICD-10-CM

## 2012-09-25 DIAGNOSIS — I2510 Atherosclerotic heart disease of native coronary artery without angina pectoris: Secondary | ICD-10-CM

## 2012-09-25 DIAGNOSIS — R0789 Other chest pain: Secondary | ICD-10-CM

## 2012-09-25 DIAGNOSIS — I10 Essential (primary) hypertension: Secondary | ICD-10-CM

## 2012-09-25 DIAGNOSIS — R2681 Unsteadiness on feet: Secondary | ICD-10-CM | POA: Diagnosis present

## 2012-09-25 DIAGNOSIS — K859 Acute pancreatitis without necrosis or infection, unspecified: Secondary | ICD-10-CM

## 2012-09-25 DIAGNOSIS — R072 Precordial pain: Secondary | ICD-10-CM

## 2012-09-25 DIAGNOSIS — K8020 Calculus of gallbladder without cholecystitis without obstruction: Principal | ICD-10-CM

## 2012-09-25 DIAGNOSIS — R079 Chest pain, unspecified: Secondary | ICD-10-CM

## 2012-09-25 LAB — COMPREHENSIVE METABOLIC PANEL
ALT: 26 U/L (ref 0–53)
AST: 19 U/L (ref 0–37)
Albumin: 3.2 g/dL — ABNORMAL LOW (ref 3.5–5.2)
Alkaline Phosphatase: 45 U/L (ref 39–117)
BUN: 25 mg/dL — ABNORMAL HIGH (ref 6–23)
CO2: 27 mEq/L (ref 19–32)
Calcium: 9.2 mg/dL (ref 8.4–10.5)
Chloride: 101 mEq/L (ref 96–112)
Creatinine, Ser: 1.07 mg/dL (ref 0.50–1.35)
GFR calc Af Amer: 75 mL/min — ABNORMAL LOW (ref >90)
GFR calc non Af Amer: 64 mL/min — ABNORMAL LOW (ref >90)
Glucose, Bld: 207 mg/dL — ABNORMAL HIGH (ref 70–99)
Potassium: 4.3 mEq/L (ref 3.5–5.1)
Sodium: 137 mEq/L (ref 135–145)
Total Bilirubin: 0.8 mg/dL (ref 0.3–1.2)
Total Protein: 7.5 g/dL (ref 6.0–8.3)

## 2012-09-25 LAB — LIPASE, BLOOD: Lipase: 110 U/L — ABNORMAL HIGH (ref 11–59)

## 2012-09-25 LAB — LIPID PANEL
HDL: 39 mg/dL — ABNORMAL LOW (ref >39)
Total CHOL/HDL Ratio: 3.7 RATIO
Triglycerides: 86 mg/dL (ref <150)

## 2012-09-25 LAB — CBC
HCT: 41.1 % (ref 39.0–52.0)
Hemoglobin: 13.7 g/dL (ref 13.0–17.0)
MCH: 28.6 pg (ref 26.0–34.0)
MCHC: 33.3 g/dL (ref 30.0–36.0)
MCV: 85.8 fL (ref 78.0–100.0)
Platelets: 246 10*3/uL (ref 150–400)
RBC: 4.79 MIL/uL (ref 4.22–5.81)
RDW: 14.1 % (ref 11.5–15.5)
WBC: 6.4 10*3/uL (ref 4.0–10.5)

## 2012-09-25 LAB — HEMOGLOBIN A1C
Hgb A1c MFr Bld: 9 % — ABNORMAL HIGH (ref <5.7)
Mean Plasma Glucose: 212 mg/dL — ABNORMAL HIGH (ref <117)

## 2012-09-25 LAB — GLUCOSE, CAPILLARY
Glucose-Capillary: 217 mg/dL — ABNORMAL HIGH (ref 70–99)
Glucose-Capillary: 281 mg/dL — ABNORMAL HIGH (ref 70–99)
Glucose-Capillary: 194 mg/dL — ABNORMAL HIGH (ref 70–99)

## 2012-09-25 LAB — MAGNESIUM: Magnesium: 2 mg/dL (ref 1.5–2.5)

## 2012-09-25 LAB — PHOSPHORUS: Phosphorus: 3 mg/dL (ref 2.3–4.6)

## 2012-09-25 LAB — VITAMIN B12: Vitamin B-12: 785 pg/mL (ref 211–911)

## 2012-09-25 LAB — TROPONIN I: Troponin I: <0.3 ng/mL (ref <0.30)

## 2012-09-25 LAB — PROTIME-INR: INR: 2.48 — ABNORMAL HIGH (ref 0.00–1.49)

## 2012-09-25 MED ORDER — SODIUM CHLORIDE 0.9 % IV SOLN
INTRAVENOUS | Status: AC
  Administered 2012-09-25: 02:00:00 via INTRAVENOUS

## 2012-09-25 MED ORDER — SODIUM CHLORIDE 0.9 % IJ SOLN
3.0000 mL | Freq: Two times a day (BID) | INTRAMUSCULAR | Status: DC
  Administered 2012-09-26 – 2012-09-27 (×3): 3 mL via INTRAVENOUS

## 2012-09-25 MED ORDER — METOPROLOL SUCCINATE ER 25 MG PO TB24: 25.0000 mg | ORAL_TABLET | Freq: Every day | ORAL | Status: DC

## 2012-09-25 MED ORDER — FLUTICASONE PROPIONATE 50 MCG/ACT NA SUSP
1.0000 | Freq: Two times a day (BID) | NASAL | Status: DC
  Administered 2012-09-25 – 2012-09-29 (×7): 1 via NASAL
  Filled 2012-09-25: qty 16

## 2012-09-25 MED ORDER — DOCUSATE SODIUM 100 MG PO CAPS
100.0000 mg | ORAL_CAPSULE | Freq: Two times a day (BID) | ORAL | Status: DC
  Administered 2012-09-25 – 2012-09-28 (×7): 100 mg via ORAL
  Filled 2012-09-25 (×11): qty 1

## 2012-09-25 MED ORDER — INSULIN ASPART 100 UNIT/ML ~~LOC~~ SOLN
0.0000 [IU] | Freq: Three times a day (TID) | SUBCUTANEOUS | Status: DC
  Administered 2012-09-25: 2 [IU] via SUBCUTANEOUS
  Administered 2012-09-26: 5 [IU] via SUBCUTANEOUS
  Administered 2012-09-26: 2 [IU] via SUBCUTANEOUS
  Administered 2012-09-28 (×2): 3 [IU] via SUBCUTANEOUS
  Administered 2012-09-28 – 2012-09-29 (×2): 2 [IU] via SUBCUTANEOUS

## 2012-09-25 MED ORDER — WARFARIN - PHARMACIST DOSING INPATIENT: Freq: Every day | Status: DC

## 2012-09-25 MED ORDER — ASPIRIN EC 81 MG PO TBEC
81.0000 mg | DELAYED_RELEASE_TABLET | Freq: Every day | ORAL | Status: DC
  Administered 2012-09-25 – 2012-09-29 (×4): 81 mg via ORAL
  Filled 2012-09-25 (×5): qty 1

## 2012-09-25 MED ORDER — DIGOXIN 250 MCG PO TABS
250.0000 ug | ORAL_TABLET | Freq: Every day | ORAL | Status: DC
  Administered 2012-09-25: 250 ug via ORAL
  Filled 2012-09-25: qty 1

## 2012-09-25 MED ORDER — AMOXICILLIN-POT CLAVULANATE 875-125 MG PO TABS
1.0000 | ORAL_TABLET | Freq: Two times a day (BID) | ORAL | Status: DC
  Administered 2012-09-25 – 2012-09-26 (×5): 1 via ORAL
  Filled 2012-09-25 (×7): qty 1

## 2012-09-25 MED ORDER — ACETAMINOPHEN 650 MG RE SUPP: 650.0000 mg | Freq: Four times a day (QID) | RECTAL | Status: DC | PRN

## 2012-09-25 MED ORDER — WARFARIN SODIUM 5 MG PO TABS
5.0000 mg | ORAL_TABLET | Freq: Once | ORAL | Status: DC
  Filled 2012-09-25: qty 1

## 2012-09-25 MED ORDER — DIGOXIN 125 MCG PO TABS
0.1250 mg | ORAL_TABLET | Freq: Every day | ORAL | Status: DC
  Administered 2012-09-26 – 2012-09-29 (×3): 0.125 mg via ORAL
  Filled 2012-09-25 (×4): qty 1

## 2012-09-25 MED ORDER — SENNA 8.6 MG PO TABS
1.0000 | ORAL_TABLET | Freq: Two times a day (BID) | ORAL | Status: DC
  Administered 2012-09-25 – 2012-09-29 (×8): 8.6 mg via ORAL
  Filled 2012-09-25 (×11): qty 1

## 2012-09-25 MED ORDER — ONDANSETRON HCL 4 MG PO TABS: 4.0000 mg | ORAL_TABLET | Freq: Four times a day (QID) | ORAL | Status: DC | PRN

## 2012-09-25 MED ORDER — ATORVASTATIN CALCIUM 20 MG PO TABS
20.0000 mg | ORAL_TABLET | Freq: Every day | ORAL | Status: DC
  Administered 2012-09-25 – 2012-09-29 (×4): 20 mg via ORAL
  Filled 2012-09-25 (×5): qty 1

## 2012-09-25 MED ORDER — SODIUM CHLORIDE 0.9 % IV SOLN: 250.0000 mL | INTRAVENOUS | Status: DC | PRN

## 2012-09-25 MED ORDER — DULOXETINE HCL 20 MG PO CPEP
20.0000 mg | ORAL_CAPSULE | Freq: Every day | ORAL | Status: DC
  Administered 2012-09-25 – 2012-09-29 (×4): 20 mg via ORAL
  Filled 2012-09-25 (×5): qty 1

## 2012-09-25 MED ORDER — REGADENOSON 0.4 MG/5ML IV SOLN
0.4000 mg | Freq: Once | INTRAVENOUS | Status: AC
  Administered 2012-09-26: 0.4 mg via INTRAVENOUS
  Filled 2012-09-25: qty 5

## 2012-09-25 MED ORDER — ACETAMINOPHEN 325 MG PO TABS: 650.0000 mg | ORAL_TABLET | Freq: Four times a day (QID) | ORAL | Status: DC | PRN

## 2012-09-25 MED ORDER — SODIUM CHLORIDE 0.9 % IJ SOLN: 3.0000 mL | INTRAMUSCULAR | Status: DC | PRN

## 2012-09-25 MED ORDER — ENALAPRIL MALEATE 2.5 MG PO TABS: 2.5000 mg | ORAL_TABLET | Freq: Every day | ORAL | Status: DC

## 2012-09-25 MED ORDER — VITAMIN K1 10 MG/ML IJ SOLN
10.0000 mg | Freq: Once | INTRAMUSCULAR | Status: AC
  Administered 2012-09-25: 10 mg via INTRAVENOUS
  Filled 2012-09-25: qty 1

## 2012-09-25 MED ORDER — HYDRALAZINE HCL 20 MG/ML IJ SOLN
5.0000 mg | INTRAMUSCULAR | Status: DC | PRN
  Administered 2012-09-25 – 2012-09-28 (×2): 5 mg via INTRAVENOUS
  Filled 2012-09-25: qty 0.25

## 2012-09-25 MED ORDER — SODIUM CHLORIDE 0.9 % IJ SOLN
3.0000 mL | Freq: Two times a day (BID) | INTRAMUSCULAR | Status: DC
  Administered 2012-09-25 (×2): 3 mL via INTRAVENOUS

## 2012-09-25 MED ORDER — METOPROLOL SUCCINATE ER 25 MG PO TB24
25.0000 mg | ORAL_TABLET | Freq: Two times a day (BID) | ORAL | Status: DC
  Administered 2012-09-25 – 2012-09-26 (×2): 25 mg via ORAL
  Filled 2012-09-25 (×3): qty 1

## 2012-09-25 MED ORDER — DILTIAZEM HCL 60 MG PO TABS
60.0000 mg | ORAL_TABLET | Freq: Two times a day (BID) | ORAL | Status: DC
  Administered 2012-09-25 (×2): 60 mg via ORAL
  Filled 2012-09-25 (×3): qty 1

## 2012-09-25 MED ORDER — INSULIN ASPART 100 UNIT/ML ~~LOC~~ SOLN
0.0000 [IU] | Freq: Every day | SUBCUTANEOUS | Status: DC
  Administered 2012-09-25 – 2012-09-27 (×2): 2 [IU] via SUBCUTANEOUS

## 2012-09-25 MED ORDER — POLYETHYLENE GLYCOL 3350 17 G PO PACK
17.0000 g | PACK | Freq: Every day | ORAL | Status: DC | PRN
  Filled 2012-09-25: qty 1

## 2012-09-25 MED ORDER — ONDANSETRON HCL 4 MG/2ML IJ SOLN: 4.0000 mg | Freq: Four times a day (QID) | INTRAMUSCULAR | Status: DC | PRN

## 2012-09-25 MED ORDER — ENALAPRIL MALEATE 2.5 MG PO TABS
2.5000 mg | ORAL_TABLET | Freq: Every day | ORAL | Status: DC
  Administered 2012-09-25 – 2012-09-26 (×2): 2.5 mg via ORAL
  Filled 2012-09-25 (×4): qty 1

## 2012-09-25 MED ORDER — HYDROCODONE-ACETAMINOPHEN 5-325 MG PO TABS
1.0000 | ORAL_TABLET | ORAL | Status: DC | PRN
  Administered 2012-09-25 – 2012-09-27 (×3): 2 via ORAL
  Administered 2012-09-28: 1 via ORAL
  Administered 2012-09-28: 2 via ORAL
  Administered 2012-09-28 (×2): 1 via ORAL
  Administered 2012-09-29: 2 via ORAL
  Administered 2012-09-29: 1 via ORAL
  Filled 2012-09-25: qty 2
  Filled 2012-09-25 (×2): qty 1
  Filled 2012-09-25: qty 2
  Filled 2012-09-25: qty 1
  Filled 2012-09-25 (×2): qty 2
  Filled 2012-09-25: qty 1
  Filled 2012-09-25: qty 2

## 2012-09-25 MED ORDER — PANTOPRAZOLE SODIUM 40 MG PO TBEC
40.0000 mg | DELAYED_RELEASE_TABLET | Freq: Every day | ORAL | Status: DC
  Administered 2012-09-25 – 2012-09-26 (×2): 40 mg via ORAL
  Filled 2012-09-25 (×2): qty 1

## 2012-09-25 NOTE — Evaluation (Signed)
Physical Therapy Evaluation Patient Details Name: [name] MRN: [id number] DOB: 17-Feb-1932 Today's Date: 09/25/2012 Time: 6213-0865 PT Time Calculation (min): 42 min  PT Assessment / Plan / Recommendation Clinical Impression  pt admitted with worsening epigastric pain , but off and on for years, also with chest pain ? not related to stomach pains.  Cholecystectomy pending stress testing.  Pt can benefit from PT to maximize function and Independence.    PT Assessment  Patient needs continued PT services    Follow Up Recommendations  Home health PT    Does the patient have the potential to tolerate intense rehabilitation      Barriers to Discharge Decreased caregiver support;Other (comment) (pt alone at daughter's home days)      Equipment Recommendations  None recommended by PT    Recommendations for Other Services     Frequency Min 3X/week    Precautions / Restrictions Precautions Precautions: None Restrictions Weight Bearing Restrictions: No   Pertinent Vitals/Pain       Mobility  Bed Mobility Bed Mobility: Supine to Sit;Sitting - Scoot to Edge of Bed;Sit to Supine Supine to Sit: 5: Supervision Sitting - Scoot to Edge of Bed: 5: Supervision Sit to Supine: 5: Supervision Transfers Transfers: Sit to Stand;Stand to Sit Sit to Stand: From bed;With upper extremity assist;5: Supervision;From toilet Stand to Sit: 4: Min guard;With upper extremity assist;To bed Details for Transfer Assistance: generally safe Ambulation/Gait Ambulation/Gait Assistance: 4: Min guard Ambulation Distance (Feet): 140 Feet Assistive device: Other (Comment) (pushing IV pole) Ambulation/Gait Assistance Details: generally steady, but not strong confident gait Gait Pattern: Step-through pattern Gait velocity: slower Stairs: No Wheelchair Mobility Wheelchair Mobility: No    Exercises     PT Diagnosis: Generalized weakness;Other (comment) (decr activity tolerance)  PT Problem List:  Decreased strength;Decreased activity tolerance;Pain PT Treatment Interventions: Gait training;Functional mobility training;Therapeutic activities;Patient/family education;DME instruction   PT Goals Acute Rehab PT Goals PT Goal Formulation: With patient Time For Goal Achievement: 10/09/12 Potential to Achieve Goals: Good Pt will go Supine/Side to Sit: with modified independence;with HOB 0 degrees PT Goal: Supine/Side to Sit - Progress: Goal set today Pt will go Sit to Stand: with modified independence PT Goal: Sit to Stand - Progress: Goal set today Pt will Transfer Bed to Chair/Chair to Bed: with modified independence PT Transfer Goal: Bed to Chair/Chair to Bed - Progress: Goal set today Pt will Ambulate: >150 feet;with modified independence;with least restrictive assistive device PT Goal: Ambulate - Progress: Goal set today Pt will Go Up / Down Stairs: 3-5 stairs;with modified independence;with rail(s) PT Goal: Up/Down Stairs - Progress: Goal set today  Visit Information  Last PT Received On: 09/25/12 Assistance Needed: +1    Subjective Data  Subjective: Dtr thinks pt is scared about getting older and losing control of his life. Patient Stated Goal: Home   Prior Functioning  Home Living Lives With: Daughter Available Help at Discharge: Other (Comment);Available PRN/intermittently (family works and/or is in school) Type of Home: House Home Access: Stairs to enter Secretary/administrator of Steps: 2 Entrance Stairs-Rails: Left;Right Home Layout: Two level;Able to live on main level with bedroom/bathroom Alternate Level Stairs-Number of Steps: flight Alternate Level Stairs-Rails: Right;Left Bathroom Toilet: Standard Home Adaptive Equipment: Straight cane Prior Function Level of Independence: Independent with assistive device(s) Able to Take Stairs?: Yes Communication Communication: Prefers language other than [place];Other (comment) (daughter interpreted)    Cognition   Cognition Overall Cognitive Status: Appears within functional limits for tasks assessed/performed Arousal/Alertness: Awake/alert Orientation Level: Appears  intact for tasks assessed Behavior During Session: [facility] for tasks performed    Extremity/Trunk Assessment Right Upper Extremity Assessment RUE ROM/Strength/Tone: Within functional levels Left Upper Extremity Assessment LUE ROM/Strength/Tone: Within functional levels Right Lower Extremity Assessment RLE ROM/Strength/Tone: [facility] for tasks assessed;Deficits RLE ROM/Strength/Tone Deficits: mild proximal weaknesses at 4/5 Left Lower Extremity Assessment LLE ROM/Strength/Tone: [facility] for tasks assessed;Deficits LLE ROM/Strength/Tone Deficits: mild proximal weakness at 4/5 Trunk Assessment Trunk Assessment: Normal   Balance    End of Session PT - End of Session Activity Tolerance: Patient tolerated treatment well Patient left: in bed;with call bell/phone within reach;with family/visitor present Nurse Communication: Mobility status  GP     Anayely Constantine, Eliseo Gum 09/25/2012, 4:25 PM 09/25/2012  Brandywine Bing, PT [phone] (pager)

## 2012-09-25 NOTE — Consult Note (Signed)
Cardiology Consult Note   Patient ID: [name] MRN: [id number], DOB/AGE: Aug 07, 1931   Admit date: 09/24/2012 Date of Consult: 09/25/2012  Primary Physician: Pcp Not In System Primary Cardiologist: has been seen by Dr. Katz/Dr. Ladona Ridgel in the past; Dr. Shirlee Latch saw in consultation in 2012  Reason for consult: pre-op evaluation  HPI: [name] is a 79 y.o. non-English speaking male from [place] with PMHx s/f CAD (s/p CABG x 4 in 2003- see below), permanent atrial fibrillation (on chronic Coumadin), type 2 DM, PVD, uncontrolled HTN, lumbar disc disease, medical noncompliance and depression with prior suicidal attempts who was admitted yesterday for epigastric pain.    2D echo 01/2011: LVEF 60-65%, mild MR, mild-mod TR, mild-mod biatrial enlargement, mild RV dilatation, insufficient study to assess diastolic function   He presented to [facility] ED yesterday c/o vague epigastric/chest pain. EKG revealed rate-controlled atrial fibrillation and RBBB. Trop-I WNL. INR 2.44. Dig level 0.5. Lipase elevated at 124. BMET unremarkable. Noncontrast head CT (patient also c/o dizziness and gait unsteadiness) revealed no acute abnormality, but did indicate generalized atrophy, chronic ischemic changes and sinusitis. CXR without acute abnormality. Abdominal u/s revealed gallstones, and possible medical renal disease. He was admitted by medicine. Two subsequent sets of trop-I returned WNL. TSH and vit B12 WNL. The plan has been made to pursue cholecystectomy pending pre-op eval for symptom improvement. 2D echo pending. Coumadin is being reversed, and heparin bridge started.   Symptoms are quite difficult to gather an overall clinical impression of the patient's complaints. Per his daughter, he has been experiencing this epigastric discomfort for 1-2 years associated with meals, which has worsened acutely. He has been more fatigued, with decreased energy choosing to rest through out the day as he "does not  feel well." She reports that he experienced a "small cold" with congestion and cough 3-4 weeks ago. Reports some shortness of breath. She reports that he does endorse chest pain, at times when exerting himself. She does report that he is short of breath. No episodes of syncope or lightheadedness. Daughter reports that he was stopped taking his meds by himself, but will take them if she administers.   Problem List: Past Medical History  Diagnosis Date  . Permanent atrial fibrillation   . Hypertension   . HNP (herniated nucleus pulposus), lumbar   . PVD (peripheral vascular disease)   . Diabetes mellitus   . CAD (coronary artery disease)     s/p CABG x 4  . Ventricular hypertrophy     Past Surgical History  Procedure Laterality Date  . Cardiac catheterization  01/2002    40-50% distal LM/ostial LAD disease, 50-60% ostial LAD, 60% mid LAD, 30-40% D1, 70% tubular mid LCx, 70% ostial disease in two ramus intermedius branches, 70% prox RCA, 50-60% mid RCA, 70% distal RCA, LVEF 40%   . Coronary artery bypass graft  01/2002    x 4: LIMA-LAD, SVG-OM, SVG-PDA-PLB     Allergies:  Allergies  Allergen Reactions  . Percocet (Oxycodone-Acetaminophen)     Home Medications: Prior to Admission medications   Medication Sig Start Date End Date Taking? Authorizing Provider  amiloride-hydrochlorothiazide (MODURETIC) 5-50 MG tablet Take 1 tablet by mouth daily.   Yes Historical Provider, MD  digoxin (LANOXIN) 0.25 MG tablet Take 250 mcg by mouth daily.     Yes Historical Provider, MD  diltiazem (CARDIZEM) 60 MG tablet Take 60 mg by mouth 2 (two) times daily.     Yes Historical Provider, MD  fish oil-omega-3  fatty acids 1000 MG capsule Take 1 g by mouth daily.   Yes Historical Provider, MD  vitamin C (ASCORBIC ACID) 500 MG tablet Take 500 mg by mouth daily.   Yes Historical Provider, MD  warfarin (COUMADIN) 3 MG tablet Take 5.25 mg by mouth daily.   Yes Historical Provider, MD    Inpatient  Medications:  . amoxicillin-clavulanate  1 tablet Oral Q12H  . aspirin EC  81 mg Oral Daily  . atorvastatin  20 mg Oral q1800  . [START ON 09/26/2012] digoxin  0.125 mg Oral Daily  . docusate sodium  100 mg Oral BID  . DULoxetine  20 mg Oral Daily  . enalapril  2.5 mg Oral Daily  . fluticasone  1 spray Each Nare BID  . insulin aspart  0-5 Units Subcutaneous QHS  . insulin aspart  0-9 Units Subcutaneous TID WC  . metoprolol succinate  25 mg Oral BID  . pantoprazole  40 mg Oral Q1200  . [START ON 09/26/2012] regadenoson  0.4 mg Intravenous Once  . senna  1 tablet Oral BID  . sodium chloride  3 mL Intravenous Q12H  . sodium chloride  3 mL Intravenous Q12H   Prescriptions prior to admission  Medication Sig Dispense Refill  . amiloride-hydrochlorothiazide (MODURETIC) 5-50 MG tablet Take 1 tablet by mouth daily.      . digoxin (LANOXIN) 0.25 MG tablet Take 250 mcg by mouth daily.        Marland Kitchen diltiazem (CARDIZEM) 60 MG tablet Take 60 mg by mouth 2 (two) times daily.        . fish oil-omega-3 fatty acids 1000 MG capsule Take 1 g by mouth daily.      . vitamin C (ASCORBIC ACID) 500 MG tablet Take 500 mg by mouth daily.      Marland Kitchen warfarin (COUMADIN) 3 MG tablet Take 5.25 mg by mouth daily.        Family History  Problem Relation Age of Onset  . Heart disease Mother   . Heart disease Sister   . Breast cancer Sister      History   Social History  . Marital Status: Widowed    Spouse Name: N/A    Number of Children: N/A  . Years of Education: N/A   Occupational History  . Not on file.   Social History Main Topics  . Smoking status: Former Smoker -- 1.00 packs/day for 30 years    Types: Cigarettes    Quit date: 02/15/1991  . Smokeless tobacco: Not on file  . Alcohol Use: No  . Drug Use: No  . Sexually Active: Not on file   Other Topics Concern  . Not on file   Social History Narrative  . No narrative on file     Review of Systems:  ROS limited due to language barrier.    Reports chest pain, shortness of breath, abdominal pain, cough, loss of appetite, fatigue   Physical Exam: Blood pressure 172/84, pulse 15, temperature 97.3 F (36.3 C), temperature source Oral, resp. rate 18, height 5\' 6"  (1.676 m), weight 68.4 kg (150 lb 12.7 oz), SpO2 98.00%.    General: Well developed, well nourished, in no acute distress. Head: Normocephalic, atraumatic, sclera non-icteric, no xanthomas, nares are without discharge.  Neck: Negative for carotid bruits. JVD not elevated. Lungs: Clear bilaterally to auscultation without wheezes, rales, or rhonchi. Breathing is unlabored. Heart: Irregularly irregular, with S1 S2, S4. No murmurs or rubs appreciated. Abdomen: Soft, epigastric tenderness, guarding upon  palpation, non-distended with hypoactive bowel sounds. No hepatomegaly. No obvious abdominal masses. Msk:  Strength and tone appears normal for age. Extremities: No clubbing, cyanosis or edema.  Distal pedal pulses are 2+ and equal bilaterally. Neuro: Moves all extremities spontaneously. Psych:  Anxious-appearing  Labs: Recent Labs     09/24/12  1920  09/25/12  0656  WBC  7.2  6.4  HGB  13.7  13.7  HCT  40.0  41.1  MCV  85.8  85.8  PLT  252  246    Recent Labs  09/25/12 0656  VITAMINB12 785    Recent Labs Lab 09/24/12 1920 09/25/12 0656  NA 134* 137  K 4.4 4.3  CL 99 101  CO2 24 27  BUN 32* 25*  CREATININE 1.25 1.07  CALCIUM 9.3 9.2  PROT 7.7 7.5  BILITOT 0.4 0.8  ALKPHOS 52 45  ALT 31 26  AST 22 19  LIPASE 124* 110*  GLUCOSE 216* 207*   Recent Labs     09/25/12  0121  09/25/12  0656  09/25/12  1309  TROPONINI  <0.30  <0.30  <0.30   Recent Labs     09/25/12  0656  CHOL  145  HDL  39*  LDLCALC  89  TRIG  86  CHOLHDL  3.7    Recent Labs  09/25/12 0656  TSH 3.766    Radiology/Studies: Dg Chest 2 View  09/24/2012  *RADIOLOGY REPORT*  Clinical Data: Chest pain.  Weakness. Shortness of breath.  CHEST - 2 VIEW  Comparison:  09/07/2010.  Findings:  Post CABG.  Heart size top normal.  Chronic lung changes with central pulmonary vascular prominence but without frank pulmonary edema, segmental infiltrate or pneumothorax.  Calcified mildly tortuous aorta.  IMPRESSION: No acute abnormality.  Please see above.   Original Report Authenticated By: Lacy Duverney, M.D.    Ct Head Wo Contrast  09/24/2012  *RADIOLOGY REPORT*  Clinical Data: Dizziness.  Generalized weakness.  Current history of hypertension.  CT HEAD WITHOUT CONTRAST  Technique:  Contiguous axial images were obtained from the base of the skull through the vertex without contrast.  Comparison: Unenhanced cranial CT 12/18/2006.  Findings: Moderate cortical and deep atrophy and mild to moderate cerebellar atrophy, unchanged.  Moderate to severe changes of small vessel disease of the white matter diffusely, unchanged.  Old lacunar strokes in the right basal ganglia, unchanged.  No mass lesion.  No midline shift.  No acute hemorrhage or hematoma.  No extra-axial fluid collections.  No evidence of acute infarction. No significant interval change.  No skull fracture or other focal osseous abnormality involving the skull.  Opacification of both maxillary sinuses, right greater than left, with air-fluid levels.  Opacification of scattered ethmoid air cells bilaterally.  Inspissated mucous in the right sphenoid sinus with a small fluid level.  Bilateral mastoid air cells and middle ear cavities well-aerated.  IMPRESSION:  1.  No acute intracranial abnormality. 2.  Stable moderate generalized atrophy, moderate to severe chronic microvascular ischemic changes of the white matter, and old lacunar strokes in the right basal ganglia. 3.  Acute bilateral maxillary sinusitis, right greater than left. Bilateral ethmoid sinusitis which may be acute or chronic.  Mild acute right sphenoid sinusitis.   Original Report Authenticated By: Hulan Saas, M.D.    US Abdomen Complete  09/24/2012   *RADIOLOGY REPORT*  Clinical Data:  Abdominal pain.  Elevated lipase and  BUN. Diabetic hypertensive patient.  COMPLETE ABDOMINAL ULTRASOUND  Comparison:  None.  Findings:  Gallbladder:  Echogenic shadowing in the gallbladder fossa suggestive of gallstones.  Gallbladder wall thickening of the 3.7 mm.  Per ultrasound technologist, the patient was not tender over this region during scanning.  Common bile duct:  6.5 mm proximally.  The mid and distal aspect not visualized secondary to bowel gas.  Liver:  No focal lesion identified.  Within normal limits in parenchymal echogenicity.  IVC:  Appears normal.  Pancreas:  Not visualized secondary to bowel gas.  Spleen:  8.9 cm.  Accessory splenic tissue noted.  Right Kidney:  9.1 cm. No hydronephrosis or renal mass.Mild increased echogenicity may represent result of medical renal disease type changes.  Left Kidney:  10.7 cm.  No hydronephrosis or renal mass.Mild increased echogenicity may represent result of medical renal disease type changes.  Abdominal aorta:  Ectatic and calcified consistent with atherosclerotic type changes.  The full extent of the aorta is not imaged secondary to bowel gas.  Portions visualized with maximal AP dimension 2.8 cm.  IMPRESSION: Echogenic shadowing in the gallbladder fossa suggestive of gallstones.  Gallbladder wall thickening of the 3.7 mm.  Pancreas, portions of the abdominal aorta and mid to distal common bile duct not visualized secondary to bowel gas.  Increased echogenicity of renal parenchyma may represent result of medical renal disease type changes.   Original Report Authenticated By: Lacy Duverney, M.D.     EKG: atrial fibrillation, RBBB, LAD  ASSESSMENT AND PLAN:   79 y.o. non-English speaking male from [place] with PMHx s/f CAD (s/p CABG x 4 in 2003- see below), permanent atrial fibrillation (on chronic Coumadin), type 2 DM, PVD, uncontrolled HTN, medical noncompliance and depression with prior suicidal attempts who was  admitted yesterday for epigastric pain, and subsequently found to have gallstone pancreatitis, with plans to undergo cholecystectomy tomorrow pending cardiac pre-op eval.   1. Gallstone pancreatitis 2. Cholelithiasis 3. CAD s/p CABG 4. Permanent atrial fibrillation 5. Type 2 DM 6. PVD 7. Uncontrolled HTN 8. Medical noncompliance 9. Depression with prior suicide attempts 10. Sinusitis  HPI and symptomatology is vague, and overall clinical impression is difficult to obtain. He has significant cardiac risk factors and cardiac history. He is not on beneficial outpatient cardiac medications and has also been noncompliant. He has not had an ischemic evaluation for some time. From a functional standpoint, he has been mostly bed bound, which his daughter reports is due to fatigue, and the development of this epigastric pain. To gain a better understanding of his cardiac status and surgical risk, will plan for [facility] tomorrow and review 2D echo being performed currently. Will start statin. Rate currently well-controlled. Replace Cardizem with Toprol-XL 25mg  BID. Reduce digoxin. Continue ASA, ACEi. If BP remain elevated despite adjustment, will need to up-titrate/add antihypertensives. Hold Coumadin, continue heparin. Further recommendations pending the interpretation of these studies. Of note, suspect anxiety/depression is affecting compliance and overall health. Agree with consultation from behavioral medicine in some capacity.     Signed, R. Hurman Horn, PA-C 09/25/2012, 3:08 PM  Patient seen with PA, agree with the above note.  Baseline not very active, has felt poorly.  Has had epigastric pain but no definite chest pain.  In hospital, negative cardiac enzymes, unchanged ECG (atrial fibrillation, RBBB).  [place] and labs concerning for gallstone pancreatitis.  He is now being considered for cholecystectomy.  1. Atrial fibrillation: HR stable.  Coumadin held for possible surgery.  Will use  Toprol XL 25 mg bid rather than diltiazem.  2. CAD: Cardiac  enzymes negative.  Suspect that his current symptoms are related to gallstone pancreatitis (epigstric pain).   - Echo to assess EF. - Poor functional status.  I think that it would be reasonable to get a Steffanie Dunn for risk stratification prior to major surgery.  - As above, use beta blocker rather than diltiazem.   - Will add statin.   Marca Ancona 09/25/2012 5:05 PM

## 2012-09-25 NOTE — Progress Notes (Signed)
Pt refused to take his insulin. MD aware, no new orders given at this time. Will continue to monitor.

## 2012-09-25 NOTE — Progress Notes (Signed)
*  PRELIMINARY RESULTS* Echocardiogram 2D Echocardiogram has been performed.  [name] 09/25/2012, 4:54 PM

## 2012-09-25 NOTE — Progress Notes (Signed)
ANTICOAGULATION CONSULT NOTE - Initial Consult  Pharmacy Consult for Coumadin Indication: atrial fibrillation  Allergies  Allergen Reactions  . Percocet (Oxycodone-Acetaminophen)     Vital Signs: Temp: 98.8 F (37.1 C) (03/17 1902) Temp src: Oral (03/17 1902) BP: 135/107 mmHg (03/18 0000) Pulse Rate: 86 (03/18 0000)  Labs:  Recent Labs  09/24/12 1920 09/24/12 1921  HGB 13.7  --   HCT 40.0  --   PLT 252  --   APTT 41*  --   LABPROT 25.4*  --   INR 2.44*  --   CREATININE 1.25  --   TROPONINI  --  <0.30    Medical History: Past Medical History  Diagnosis Date  . Atrial fibrillation   . Hypotension   . HNP (herniated nucleus pulposus), lumbar   . PVD (peripheral vascular disease)   . Diabetes mellitus   . CAD (coronary artery disease)   . Ventricular hypertrophy     Medications:  Prescriptions prior to admission  Medication Sig Dispense Refill  . amiloride-hydrochlorothiazide (MODURETIC) 5-50 MG tablet Take 1 tablet by mouth daily.      . digoxin (LANOXIN) 0.25 MG tablet Take 250 mcg by mouth daily.        Marland Kitchen diltiazem (CARDIZEM) 60 MG tablet Take 60 mg by mouth 2 (two) times daily.        . fish oil-omega-3 fatty acids 1000 MG capsule Take 1 g by mouth daily.      . vitamin C (ASCORBIC ACID) 500 MG tablet Take 500 mg by mouth daily.      Marland Kitchen warfarin (COUMADIN) 3 MG tablet Take 5.25 mg by mouth daily.       Scheduled:  . amoxicillin-clavulanate  1 tablet Oral Q12H  . aspirin EC  81 mg Oral Daily  . digoxin  250 mcg Oral Daily  . diltiazem  60 mg Oral BID  . docusate sodium  100 mg Oral BID  . DULoxetine  20 mg Oral Daily  . enalapril  2.5 mg Oral Daily  . fluticasone  1 spray Each Nare BID  . insulin aspart  0-5 Units Subcutaneous QHS  . insulin aspart  0-9 Units Subcutaneous TID WC  . senna  1 tablet Oral BID  . sodium chloride  3 mL Intravenous Q12H  . sodium chloride  3 mL Intravenous Q12H    Assessment: 79yo male c/o SOB, CP, and epigastric pain,  daughter is concerned he is depressed (suicide attempt in 2013) and overmedicating himself, to continue Coumadin for Afib; admitted with therapeutic INR though pt admits he's been taking "a little extra".  Goal of Therapy:  INR 2-3   Plan:  Will verify Coumadin dosing with pt's daughter and monitor INR.  Vernard Gambles, PharmD, BCPS  09/25/2012,1:28 AM

## 2012-09-25 NOTE — Consult Note (Signed)
Manual [name] July 17, 1931  [id number].   Primary Care MD: None Requesting MD: Dr. Loren Racer Chief Complaint/Reason for Consult: gallstones HPI: This is a 79yo [place] male who was brought into the ED last night secondary to epigastric abdominal pain.  He states (via Nurse, learning disability) that he has had similar pain for the last year.  He states that he eats, then his stomach starts hurting, and then he gets very anxious and pain persists.  He admits to nausea and emesis at times.  He denies any diarrhea.  Upon arrival he had an abdominal ultrasound that revealed cholelithiasis.  We have been asked to see him for further evaluation.  Review of Systems: Please see HPI, otherwise all other systems are currently negative.  Family History  Problem Relation Age of Onset  . Heart disease Mother   . Heart disease Sister   . Breast cancer Sister     Past Medical History  Diagnosis Date  . Permanent atrial fibrillation   . Hypertension   . HNP (herniated nucleus pulposus), lumbar   . PVD (peripheral vascular disease)   . Diabetes mellitus   . CAD (coronary artery disease)     s/p CABG x 4  . Ventricular hypertrophy     Past Surgical History  Procedure Laterality Date  . Cardiac catheterization  01/2002    40-50% distal LM/ostial LAD disease, 50-60% ostial LAD, 60% mid LAD, 30-40% D1, 70% tubular mid LCx, 70% ostial disease in two ramus intermedius branches, 70% prox RCA, 50-60% mid RCA, 70% distal RCA, LVEF 40%   . Coronary artery bypass graft  01/2002    x 4: LIMA-LAD, SVG-OM, SVG-PDA-PLB    Social History:  reports that he quit smoking about 21 years ago. His smoking use included Cigarettes. He has a 30 pack-year smoking history. He does not have any smokeless tobacco history on file. He reports that he does not drink alcohol or use illicit drugs.  Allergies:  Allergies  Allergen Reactions  . Percocet (Oxycodone-Acetaminophen)     Medications Prior to Admission  Medication Sig  Dispense Refill  . amiloride-hydrochlorothiazide (MODURETIC) 5-50 MG tablet Take 1 tablet by mouth daily.      . digoxin (LANOXIN) 0.25 MG tablet Take 250 mcg by mouth daily.        Marland Kitchen diltiazem (CARDIZEM) 60 MG tablet Take 60 mg by mouth 2 (two) times daily.        . fish oil-omega-3 fatty acids 1000 MG capsule Take 1 g by mouth daily.      . vitamin C (ASCORBIC ACID) 500 MG tablet Take 500 mg by mouth daily.      Marland Kitchen warfarin (COUMADIN) 3 MG tablet Take 5.25 mg by mouth daily.        Blood pressure 168/86, pulse 89, temperature 98.2 F (36.8 C), temperature source Oral, resp. rate 20, height 5\' 6"  (1.676 m), weight 150 lb 12.7 oz (68.4 kg), SpO2 99.00%. Physical Exam: General: pleasant, white male who is laying in bed in NAD HEENT: head is normocephalic, atraumatic.  Sclera are noninjected.  PERRL.  Ears and nose without any masses or lesions.  Mouth is pink and moist Heart: irregularly irregular  Normal s1,s2. No obvious murmurs, gallops, or rubs noted.  Palpable radial and pedal pulses bilaterally Lungs: CTAB, no wheezes, rhonchi, or rales noted.  Respiratory effort nonlabored Abd: soft, mild epigastric and RUQ tenderness, ND, +BS, no masses, hernias, or organomegaly MS: all 4 extremities are symmetrical with no cyanosis, clubbing, or edema.  Skin: warm and dry with no masses, lesions, or rashes Psych: A&Ox3 with an appropriate affect.    Results for orders placed during the hospital encounter of 09/24/12 (from the past 48 hour(s))  CBC WITH DIFFERENTIAL     Status: Abnormal   Collection Time    09/24/12  7:20 PM      Result Value Range   WBC 7.2  4.0 - 10.5 K/uL   RBC 4.66  4.22 - 5.81 MIL/uL   Hemoglobin 13.7  13.0 - 17.0 g/dL   HCT 16.1  09.6 - 04.5 %   MCV 85.8  78.0 - 100.0 fL   MCH 29.4  26.0 - 34.0 pg   MCHC 34.3  30.0 - 36.0 g/dL   RDW 40.9  81.1 - 91.4 %   Platelets 252  150 - 400 K/uL   Neutrophils Relative 64  43 - 77 %   Neutro Abs 4.6  1.7 - 7.7 K/uL   Lymphocytes  Relative 22  12 - 46 %   Lymphs Abs 1.6  0.7 - 4.0 K/uL   Monocytes Relative 11  3 - 12 %   Monocytes Absolute 0.8  0.1 - 1.0 K/uL   Eosinophils Relative 2  0 - 5 %   Eosinophils Absolute 0.1  0.0 - 0.7 K/uL   Basophils Relative 2 (*) 0 - 1 %   Basophils Absolute 0.1  0.0 - 0.1 K/uL  COMPREHENSIVE METABOLIC PANEL     Status: Abnormal   Collection Time    09/24/12  7:20 PM      Result Value Range   Sodium 134 (*) 135 - 145 mEq/L   Potassium 4.4  3.5 - 5.1 mEq/L   Chloride 99  96 - 112 mEq/L   CO2 24  19 - 32 mEq/L   Glucose, Bld 216 (*) 70 - 99 mg/dL   BUN 32 (*) 6 - 23 mg/dL   Creatinine, Ser 7.82  0.50 - 1.35 mg/dL   Calcium 9.3  8.4 - 95.6 mg/dL   Total Protein 7.7  6.0 - 8.3 g/dL   Albumin 3.3 (*) 3.5 - 5.2 g/dL   AST 22  0 - 37 U/L   ALT 31  0 - 53 U/L   Alkaline Phosphatase 52  39 - 117 U/L   Total Bilirubin 0.4  0.3 - 1.2 mg/dL   GFR calc non Af Amer 53 (*) >90 mL/min   GFR calc Af Amer 62 (*) >90 mL/min   Comment:            The eGFR has been calculated     using the CKD EPI equation.     This calculation has not been     validated in all clinical     situations.     eGFR's persistently     <90 mL/min signify     possible Chronic Kidney Disease.  PROTIME-INR     Status: Abnormal   Collection Time    09/24/12  7:20 PM      Result Value Range   Prothrombin Time 25.4 (*) 11.6 - 15.2 seconds   INR 2.44 (*) 0.00 - 1.49  LIPASE, BLOOD     Status: Abnormal   Collection Time    09/24/12  7:20 PM      Result Value Range   Lipase 124 (*) 11 - 59 U/L  APTT     Status: Abnormal   Collection Time    09/24/12  7:20 PM  Result Value Range   aPTT 41 (*) 24 - 37 seconds   Comment:            IF BASELINE aPTT IS ELEVATED,     SUGGEST PATIENT RISK ASSESSMENT     BE USED TO DETERMINE APPROPRIATE     ANTICOAGULANT THERAPY.  DIGOXIN LEVEL     Status: Abnormal   Collection Time    09/24/12  7:20 PM      Result Value Range   Digoxin Level 0.5 (*) 0.8 - 2.0 ng/mL   TROPONIN I     Status: None   Collection Time    09/24/12  7:21 PM      Result Value Range   Troponin I <0.30  <0.30 ng/mL   Comment:            Due to the release kinetics of cTnI,     a negative result within the first hours     of the onset of symptoms does not rule out     myocardial infarction with certainty.     If myocardial infarction is still suspected,     repeat the test at appropriate intervals.  URINALYSIS, ROUTINE W REFLEX MICROSCOPIC     Status: Abnormal   Collection Time    09/24/12  9:11 PM      Result Value Range   Color, Urine YELLOW  YELLOW   APPearance CLEAR  CLEAR   Specific Gravity, Urine 1.023  1.005 - 1.030   pH 5.5  5.0 - 8.0   Glucose, UA 250 (*) NEGATIVE mg/dL   Hgb urine dipstick TRACE (*) NEGATIVE   Bilirubin Urine NEGATIVE  NEGATIVE   Ketones, ur NEGATIVE  NEGATIVE mg/dL   Protein, ur >213 (*) NEGATIVE mg/dL   Urobilinogen, UA 0.2  0.0 - 1.0 mg/dL   Nitrite NEGATIVE  NEGATIVE   Leukocytes, UA NEGATIVE  NEGATIVE  URINE MICROSCOPIC-ADD ON     Status: Abnormal   Collection Time    09/24/12  9:11 PM      Result Value Range   WBC, UA 0-2  <3 WBC/hpf   RBC / HPF 0-2  <3 RBC/hpf   Bacteria, UA RARE  RARE   Casts HYALINE CASTS (*) NEGATIVE  TROPONIN I     Status: None   Collection Time    09/25/12  1:21 AM      Result Value Range   Troponin I <0.30  <0.30 ng/mL   Comment:            Due to the release kinetics of cTnI,     a negative result within the first hours     of the onset of symptoms does not rule out     myocardial infarction with certainty.     If myocardial infarction is still suspected,     repeat the test at appropriate intervals.  GLUCOSE, CAPILLARY     Status: Abnormal   Collection Time    09/25/12  1:30 AM      Result Value Range   Glucose-Capillary 199 (*) 70 - 99 mg/dL  GLUCOSE, CAPILLARY     Status: Abnormal   Collection Time    09/25/12  6:28 AM      Result Value Range   Glucose-Capillary 217 (*) 70 - 99 mg/dL   MAGNESIUM     Status: None   Collection Time    09/25/12  6:56 AM      Result Value Range   Magnesium  2.0  1.5 - 2.5 mg/dL  PHOSPHORUS     Status: None   Collection Time    09/25/12  6:56 AM      Result Value Range   Phosphorus 3.0  2.3 - 4.6 mg/dL  TSH     Status: None   Collection Time    09/25/12  6:56 AM      Result Value Range   TSH 3.766  0.350 - 4.500 uIU/mL  COMPREHENSIVE METABOLIC PANEL     Status: Abnormal   Collection Time    09/25/12  6:56 AM      Result Value Range   Sodium 137  135 - 145 mEq/L   Potassium 4.3  3.5 - 5.1 mEq/L   Chloride 101  96 - 112 mEq/L   CO2 27  19 - 32 mEq/L   Glucose, Bld 207 (*) 70 - 99 mg/dL   BUN 25 (*) 6 - 23 mg/dL   Creatinine, Ser 5.78  0.50 - 1.35 mg/dL   Calcium 9.2  8.4 - 46.9 mg/dL   Total Protein 7.5  6.0 - 8.3 g/dL   Albumin 3.2 (*) 3.5 - 5.2 g/dL   AST 19  0 - 37 U/L   ALT 26  0 - 53 U/L   Alkaline Phosphatase 45  39 - 117 U/L   Total Bilirubin 0.8  0.3 - 1.2 mg/dL   GFR calc non Af Amer 64 (*) >90 mL/min   GFR calc Af Amer 75 (*) >90 mL/min   Comment:            The eGFR has been calculated     using the CKD EPI equation.     This calculation has not been     validated in all clinical     situations.     eGFR's persistently     <90 mL/min signify     possible Chronic Kidney Disease.  CBC     Status: None   Collection Time    09/25/12  6:56 AM      Result Value Range   WBC 6.4  4.0 - 10.5 K/uL   RBC 4.79  4.22 - 5.81 MIL/uL   Hemoglobin 13.7  13.0 - 17.0 g/dL   HCT 62.9  52.8 - 41.3 %   MCV 85.8  78.0 - 100.0 fL   MCH 28.6  26.0 - 34.0 pg   MCHC 33.3  30.0 - 36.0 g/dL   RDW 24.4  01.0 - 27.2 %   Platelets 246  150 - 400 K/uL  TROPONIN I     Status: None   Collection Time    09/25/12  6:56 AM      Result Value Range   Troponin I <0.30  <0.30 ng/mL   Comment:            Due to the release kinetics of cTnI,     a negative result within the first hours     of the onset of symptoms does not rule out      myocardial infarction with certainty.     If myocardial infarction is still suspected,     repeat the test at appropriate intervals.  LIPASE, BLOOD     Status: Abnormal   Collection Time    09/25/12  6:56 AM      Result Value Range   Lipase 110 (*) 11 - 59 U/L  VITAMIN B12     Status: None   Collection Time  09/25/12  6:56 AM      Result Value Range   Vitamin B-12 785  211 - 911 pg/mL  PROTIME-INR     Status: Abnormal   Collection Time    09/25/12  6:56 AM      Result Value Range   Prothrombin Time 25.7 (*) 11.6 - 15.2 seconds   INR 2.48 (*) 0.00 - 1.49  LIPID PANEL     Status: Abnormal   Collection Time    09/25/12  6:56 AM      Result Value Range   Cholesterol 145  0 - 200 mg/dL   Triglycerides 86  <161 mg/dL   HDL 39 (*) >09 mg/dL   Total CHOL/HDL Ratio 3.7     VLDL 17  0 - 40 mg/dL   LDL Cholesterol 89  0 - 99 mg/dL   Comment:            Total Cholesterol/HDL:CHD Risk     Coronary Heart Disease Risk Table                         Men   Women      1/2 Average Risk   3.4   3.3      Average Risk       5.0   4.4      2 X Average Risk   9.6   7.1      3 X Average Risk  23.4   11.0                Use the calculated Patient Ratio     above and the CHD Risk Table     to determine the patient's CHD Risk.                ATP III CLASSIFICATION (LDL):      <100     mg/dL   Optimal      604-540  mg/dL   Near or Above                        Optimal      130-159  mg/dL   Borderline      981-191  mg/dL   High      >478     mg/dL   Very High  GLUCOSE, CAPILLARY     Status: Abnormal   Collection Time    09/25/12 11:24 AM      Result Value Range   Glucose-Capillary 210 (*) 70 - 99 mg/dL   Comment 1 Documented in Chart     Comment 2 Notify RN     Dg Chest 2 View  09/24/2012  *RADIOLOGY REPORT*  Clinical Data: Chest pain.  Weakness. Shortness of breath.  CHEST - 2 VIEW  Comparison: 09/07/2010.  Findings:  Post CABG.  Heart size top normal.  Chronic lung changes with central  pulmonary vascular prominence but without frank pulmonary edema, segmental infiltrate or pneumothorax.  Calcified mildly tortuous aorta.  IMPRESSION: No acute abnormality.  Please see above.   Original Report Authenticated By: Lacy Duverney, M.D.    Ct Head Wo Contrast  09/24/2012  *RADIOLOGY REPORT*  Clinical Data: Dizziness.  Generalized weakness.  Current history of hypertension.  CT HEAD WITHOUT CONTRAST  Technique:  Contiguous axial images were obtained from the base of the skull through the vertex without contrast.  Comparison: Unenhanced cranial CT 12/18/2006.  Findings: Moderate cortical and deep  atrophy and mild to moderate cerebellar atrophy, unchanged.  Moderate to severe changes of small vessel disease of the white matter diffusely, unchanged.  Old lacunar strokes in the right basal ganglia, unchanged.  No mass lesion.  No midline shift.  No acute hemorrhage or hematoma.  No extra-axial fluid collections.  No evidence of acute infarction. No significant interval change.  No skull fracture or other focal osseous abnormality involving the skull.  Opacification of both maxillary sinuses, right greater than left, with air-fluid levels.  Opacification of scattered ethmoid air cells bilaterally.  Inspissated mucous in the right sphenoid sinus with a small fluid level.  Bilateral mastoid air cells and middle ear cavities well-aerated.  IMPRESSION:  1.  No acute intracranial abnormality. 2.  Stable moderate generalized atrophy, moderate to severe chronic microvascular ischemic changes of the white matter, and old lacunar strokes in the right basal ganglia. 3.  Acute bilateral maxillary sinusitis, right greater than left. Bilateral ethmoid sinusitis which may be acute or chronic.  Mild acute right sphenoid sinusitis.   Original Report Authenticated By: Hulan Saas, M.D.    US Abdomen Complete  09/24/2012  *RADIOLOGY REPORT*  Clinical Data:  Abdominal pain.  Elevated lipase and  BUN. Diabetic hypertensive  patient.  COMPLETE ABDOMINAL ULTRASOUND  Comparison:  None.  Findings:  Gallbladder:  Echogenic shadowing in the gallbladder fossa suggestive of gallstones.  Gallbladder wall thickening of the 3.7 mm.  Per ultrasound technologist, the patient was not tender over this region during scanning.  Common bile duct:  6.5 mm proximally.  The mid and distal aspect not visualized secondary to bowel gas.  Liver:  No focal lesion identified.  Within normal limits in parenchymal echogenicity.  IVC:  Appears normal.  Pancreas:  Not visualized secondary to bowel gas.  Spleen:  8.9 cm.  Accessory splenic tissue noted.  Right Kidney:  9.1 cm. No hydronephrosis or renal mass.Mild increased echogenicity may represent result of medical renal disease type changes.  Left Kidney:  10.7 cm.  No hydronephrosis or renal mass.Mild increased echogenicity may represent result of medical renal disease type changes.  Abdominal aorta:  Ectatic and calcified consistent with atherosclerotic type changes.  The full extent of the aorta is not imaged secondary to bowel gas.  Portions visualized with maximal AP dimension 2.8 cm.  IMPRESSION: Echogenic shadowing in the gallbladder fossa suggestive of gallstones.  Gallbladder wall thickening of the 3.7 mm.  Pancreas, portions of the abdominal aorta and mid to distal common bile duct not visualized secondary to bowel gas.  Increased echogenicity of renal parenchyma may represent result of medical renal disease type changes.   Original Report Authenticated By: Lacy Duverney, M.D.        Assessment/Plan 1. Biliary colic Patient Active Problem List  Diagnosis  . DIABETES MELLITUS  . HYPERTENSION, BENIGN  . AMI  . FIBRILLATION, ATRIAL  . DERMATITIS  . ENURESIS, NOCTURNAL  . PVD WITH CLAUDICATION  . Chest pain  . Cholelithiases  . Sinusitis  . Unsteady gait   Plan: 1. The patient needs to have a cardiac evaluation as he is not compliant with his meds and likely hasn't had a thorough cardiac  evaluation since his CABG 10 years ago.  His INR is around 2.5.  I will hold his coumadin and give him Vitamin K 10. If cardiology clears him and his INR is below 1.5 tomorrow we can likely proceed with a cholecystectomy. I have d/w the patient and he is agreeable. OSBORNE,KELLY E 09/25/2012,  1:49 PM Pager: [phone]  I have seen and evaluated this patient.  His daughter provided translation services.  He has had 2-3 years of postprandial epigastric pain with some back pain and nausea.  He also has had some weight loss due to fear of eating.  He appears to have gallstones and this is probably symptomatic cholelithiasis and possible mild pancreatitis.  I discussed with him the option for continued observation vs. Surgery with the hope of relief of his symptoms.  He would like to have cholecystectomy if there is a chance for relief of his symptoms.  I discussed the risks of the procedure.  The risks of infection, bleeding, pain, persistent symptoms, scarring, injury to bowel or bile ducts, retained stone, diarrhea, need for additional procedures, and need for open surgery discussed with the patient. We will plan for cholecystectomy pending improvement in INR and cardiology evaluation.

## 2012-09-25 NOTE — Progress Notes (Signed)
[facility] PROGRESS NOTE  [name] JYN:[id number] DOB: [birth year] DOA: 09/24/2012 PCP: Pcp Not In System  Brief Narrative: [name] is a 79 y.o. male  has a past medical history of Atrial fibrillation; Hypotension; HNP (herniated nucleus pulposus), lumbar; PVD (peripheral vascular disease); Diabetes mellitus; CAD (coronary artery disease); and Ventricular hypertrophy.  Presented with  Patient moved here from [place] since 2006 he has been non-compliant with his medications and his daughter is worried that he might be depressed. While visiting to [place] he had a suicidal attempt in 2013. He was diagnosed with situational depression but he refuses to take his medication. He has been crying out at night. Patient is straight about dying he is constantly feeling his pulse to see if his heart was still beating.  Patient has been complaining about epigastric pain that has been there for the past 2-3 years but it seems to get worse recently. His lipase was slightly elevated to 125 and in emergency department US showed cholelithiasis but no biliary dilatation. No nausea or vomiting no liver function abnormalities.  He reports some dizziness and sensation that he is unsteady on his feet this has been going on for few years but now it is worse.  Today he stated that he feels short of breath and having chest pain and feeling like his is going to die.  Patient is being vague with his symptoms. Family states he has been having a lot of anxiety and wants to take extra medication to help his symptoms. He's been trying to take extra Coumadin although only slightly. His daughter has taken over his medications because she is concerned he may be overmedicating himself.  Patient has history of coronary artery disease and while visiting to [place] in 2004 have had a quadruple bypass. They currently do not have primary care provider but is scheduled to see [facility] cardiology in  April.  Assessment/Plan: Epigastric pain - likely related to cholelithiasis. I talked to Surgery today and plan for cholecystectomy tomorrow. - cardiology consult for peri-op management   Chest pain - seems like this is more epigastric than true chest pain.  - 2D echo pending.  - troponins negative x 3  HYPERTENSION, BENIGN - patient does not take all his medications consistently will continue his diltiazem but also add enalapril   FIBRILLATION, ATRIAL - rate controlled on diltiazem continued digoxin. Digoxin level was not supratherapeutic.  - hold Coumadin for planned surgery. Vit K today  DIABETES MELLITUS -apparently is not taking anything for it but watch blood sugars and put on sliding scale.   Sinusitis - evidence of acute sinusitis we'll cover with Augmentin   Unsteady gait - This is not an acute change and has been going on for years is no evidence of recent CVA but a history of past CVAs evident on the CT scan. We'll make sure he is on aspirin continue Coumadin. Order PT OT evaluation to further evaluate his symptoms and needs at home.   Possible depression - would recommend either psychiatry followup as an outpatient or inpatient consult. For now will initiate on Cymbalta and monitor. Also check TSH vitamin B 12 level and folate.  Code Status: Full Family Communication: none, will talk to daughter later today as she plans to come otherwise will call [name], [phone])  Disposition Plan: surgery tomorrow  Consultants:  Surgery  Cardiology  Procedures:  none  Antibiotics:  Augmentin 3/17 >>  HPI/Subjective: - denies chest pain, endorses epigastric pain  Objective: Filed Vitals:  09/25/12 0000 09/25/12 0132 09/25/12 0426 09/25/12 0740  BP: 135/107 199/115 188/101 168/86  Pulse: 86 93 89   Temp:  98.2 F (36.8 C) 98.2 F (36.8 C)   TempSrc:  Oral Oral   Resp: 23 18 20    Height:  5\' 6"  (1.676 m)    Weight:  68.4 kg (150 lb 12.7 oz)    SpO2: 100% 100%  99%     Intake/Output Summary (Last 24 hours) at 09/25/12 0913 Last data filed at 09/25/12 0600  Gross per 24 hour  Intake 543.75 ml  Output    250 ml  Net 293.75 ml   Filed Weights   09/25/12 0132  Weight: 68.4 kg (150 lb 12.7 oz)    Exam:   General:  NAD  Cardiovascular: iregular, without MRG  Respiratory: good air movement, clear to auscultation throughout, no wheezing, ronchi or rales  Abdomen: soft, mildly tender to palpation in the epigastric area, positive bowel sounds  MSK: no peripheral edema  Neuro: CN 2-12 grossly intact, MS 5/5 in all 4  Data Reviewed: Basic Metabolic Panel:  Recent Labs Lab 09/24/12 1920 09/25/12 0656  NA 134* 137  K 4.4 4.3  CL 99 101  CO2 24 27  GLUCOSE 216* 207*  BUN 32* 25*  CREATININE 1.25 1.07  CALCIUM 9.3 9.2  MG  --  2.0  PHOS  --  3.0   Liver Function Tests:  Recent Labs Lab 09/24/12 1920 09/25/12 0656  AST 22 19  ALT 31 26  ALKPHOS 52 45  BILITOT 0.4 0.8  PROT 7.7 7.5  ALBUMIN 3.3* 3.2*    Recent Labs Lab 09/24/12 1920 09/25/12 0656  LIPASE 124* 110*   CBC:  Recent Labs Lab 09/24/12 1920 09/25/12 0656  WBC 7.2 6.4  NEUTROABS 4.6  --   HGB 13.7 13.7  HCT 40.0 41.1  MCV 85.8 85.8  PLT 252 246   Cardiac Enzymes:  Recent Labs Lab 09/24/12 1921 09/25/12 0121 09/25/12 0656  TROPONINI <0.30 <0.30 <0.30   CBG:  Recent Labs Lab 09/25/12 0130 09/25/12 0628  GLUCAP 199* 217*   Studies: Dg Chest 2 View  09/24/2012  *RADIOLOGY REPORT*  Clinical Data: Chest pain.  Weakness. Shortness of breath.  CHEST - 2 VIEW  Comparison: 09/07/2010.  Findings:  Post CABG.  Heart size top normal.  Chronic lung changes with central pulmonary vascular prominence but without frank pulmonary edema, segmental infiltrate or pneumothorax.  Calcified mildly tortuous aorta.  IMPRESSION: No acute abnormality.  Please see above.   Original Report Authenticated By: Lacy Duverney, M.D.    Ct Head Wo  Contrast  09/24/2012  *RADIOLOGY REPORT*  Clinical Data: Dizziness.  Generalized weakness.  Current history of hypertension.  CT HEAD WITHOUT CONTRAST  Technique:  Contiguous axial images were obtained from the base of the skull through the vertex without contrast.  Comparison: Unenhanced cranial CT 12/18/2006.  Findings: Moderate cortical and deep atrophy and mild to moderate cerebellar atrophy, unchanged.  Moderate to severe changes of small vessel disease of the white matter diffusely, unchanged.  Old lacunar strokes in the right basal ganglia, unchanged.  No mass lesion.  No midline shift.  No acute hemorrhage or hematoma.  No extra-axial fluid collections.  No evidence of acute infarction. No significant interval change.  No skull fracture or other focal osseous abnormality involving the skull.  Opacification of both maxillary sinuses, right greater than left, with air-fluid levels.  Opacification of scattered ethmoid air cells bilaterally.  Inspissated mucous in the right sphenoid sinus with a small fluid level.  Bilateral mastoid air cells and middle ear cavities well-aerated.  IMPRESSION:  1.  No acute intracranial abnormality. 2.  Stable moderate generalized atrophy, moderate to severe chronic microvascular ischemic changes of the white matter, and old lacunar strokes in the right basal ganglia. 3.  Acute bilateral maxillary sinusitis, right greater than left. Bilateral ethmoid sinusitis which may be acute or chronic.  Mild acute right sphenoid sinusitis.   Original Report Authenticated By: Hulan Saas, M.D.    US Abdomen Complete  09/24/2012  *RADIOLOGY REPORT*  Clinical Data:  Abdominal pain.  Elevated lipase and  BUN. Diabetic hypertensive patient.  COMPLETE ABDOMINAL ULTRASOUND  Comparison:  None.  Findings:  Gallbladder:  Echogenic shadowing in the gallbladder fossa suggestive of gallstones.  Gallbladder wall thickening of the 3.7 mm.  Per ultrasound technologist, the patient was not tender  over this region during scanning.  Common bile duct:  6.5 mm proximally.  The mid and distal aspect not visualized secondary to bowel gas.  Liver:  No focal lesion identified.  Within normal limits in parenchymal echogenicity.  IVC:  Appears normal.  Pancreas:  Not visualized secondary to bowel gas.  Spleen:  8.9 cm.  Accessory splenic tissue noted.  Right Kidney:  9.1 cm. No hydronephrosis or renal mass.Mild increased echogenicity may represent result of medical renal disease type changes.  Left Kidney:  10.7 cm.  No hydronephrosis or renal mass.Mild increased echogenicity may represent result of medical renal disease type changes.  Abdominal aorta:  Ectatic and calcified consistent with atherosclerotic type changes.  The full extent of the aorta is not imaged secondary to bowel gas.  Portions visualized with maximal AP dimension 2.8 cm.  IMPRESSION: Echogenic shadowing in the gallbladder fossa suggestive of gallstones.  Gallbladder wall thickening of the 3.7 mm.  Pancreas, portions of the abdominal aorta and mid to distal common bile duct not visualized secondary to bowel gas.  Increased echogenicity of renal parenchyma may represent result of medical renal disease type changes.   Original Report Authenticated By: Lacy Duverney, M.D.    Scheduled Meds: . amoxicillin-clavulanate  1 tablet Oral Q12H  . aspirin EC  81 mg Oral Daily  . digoxin  250 mcg Oral Daily  . diltiazem  60 mg Oral BID  . docusate sodium  100 mg Oral BID  . DULoxetine  20 mg Oral Daily  . enalapril  2.5 mg Oral Daily  . fluticasone  1 spray Each Nare BID  . insulin aspart  0-5 Units Subcutaneous QHS  . insulin aspart  0-9 Units Subcutaneous TID WC  . pantoprazole  40 mg Oral Q1200  . senna  1 tablet Oral BID  . sodium chloride  3 mL Intravenous Q12H  . sodium chloride  3 mL Intravenous Q12H  . warfarin  5 mg Oral ONCE-1800  . Warfarin - Pharmacist Dosing Inpatient   Does not apply q1800   Continuous Infusions: . sodium  chloride 75 mL/hr at 09/25/12 0157    Active Problems:   DIABETES MELLITUS   HYPERTENSION, BENIGN   FIBRILLATION, ATRIAL   Chest pain   Cholelithiases   Sinusitis   Unsteady gait   Pamella Pert, MD [facility] Pager [phone]. If 7 PM - 7 AM, please contact night-coverage at [email or web address], password [facility] 09/25/2012, 9:13 AM  LOS: 1 day

## 2012-09-25 NOTE — Progress Notes (Signed)
ANTICOAGULATION CONSULT NOTE - Follow up  Pharmacy Consult for Coumadin Indication: atrial fibrillation  Allergies  Allergen Reactions  . Percocet (Oxycodone-Acetaminophen)     Vital Signs: Temp: 98.2 F (36.8 C) (03/18 0426) Temp src: Oral (03/18 0426) BP: 168/86 mmHg (03/18 0740) Pulse Rate: 89 (03/18 0426)  Labs:  Recent Labs  09/24/12 1920 09/24/12 1921 09/25/12 0121 09/25/12 0656  HGB 13.7  --   --  13.7  HCT 40.0  --   --  41.1  PLT 252  --   --  246  APTT 41*  --   --   --   LABPROT 25.4*  --   --  25.7*  INR 2.44*  --   --  2.48*  CREATININE 1.25  --   --  1.07  TROPONINI  --  <0.30 <0.30 <0.30    Medical History: Past Medical History  Diagnosis Date  . Atrial fibrillation   . Hypotension   . HNP (herniated nucleus pulposus), lumbar   . PVD (peripheral vascular disease)   . Diabetes mellitus   . CAD (coronary artery disease)   . Ventricular hypertrophy     Medications:  Prescriptions prior to admission  Medication Sig Dispense Refill  . amiloride-hydrochlorothiazide (MODURETIC) 5-50 MG tablet Take 1 tablet by mouth daily.      . digoxin (LANOXIN) 0.25 MG tablet Take 250 mcg by mouth daily.        Marland Kitchen diltiazem (CARDIZEM) 60 MG tablet Take 60 mg by mouth 2 (two) times daily.        . fish oil-omega-3 fatty acids 1000 MG capsule Take 1 g by mouth daily.      . vitamin C (ASCORBIC ACID) 500 MG tablet Take 500 mg by mouth daily.      Marland Kitchen warfarin (COUMADIN) 3 MG tablet Take 5.25 mg by mouth daily.       Scheduled:  . amoxicillin-clavulanate  1 tablet Oral Q12H  . aspirin EC  81 mg Oral Daily  . digoxin  250 mcg Oral Daily  . diltiazem  60 mg Oral BID  . docusate sodium  100 mg Oral BID  . DULoxetine  20 mg Oral Daily  . enalapril  2.5 mg Oral Daily  . fluticasone  1 spray Each Nare BID  . insulin aspart  0-5 Units Subcutaneous QHS  . insulin aspart  0-9 Units Subcutaneous TID WC  . pantoprazole  40 mg Oral Q1200  . senna  1 tablet Oral BID  .  sodium chloride  3 mL Intravenous Q12H  . sodium chloride  3 mL Intravenous Q12H  . Warfarin - Pharmacist Dosing Inpatient   Does not apply q1800  . [DISCONTINUED] enalapril  2.5 mg Oral Daily    Assessment: 79yo male admitted with shortness of breath and chest pain, on chronic coumadin for h/o afib. INR therapeutic, med history not complete but has been on 5mg  daily in past.  Goal of Therapy:  INR 2-3   Plan:  Coumadin 5mg  today and f/u daily protime.  Verlene Mayer, PharmD, BCPS Pager [phone]  09/25/2012,8:53 AM

## 2012-09-26 ENCOUNTER — Inpatient Hospital Stay (HOSPITAL_COMMUNITY): Payer: Medicaid Other

## 2012-09-26 ENCOUNTER — Encounter (HOSPITAL_COMMUNITY)
Admission: EM | Disposition: A | Discharge status: Home / Self Care | Payer: Self-pay | Source: Ambulatory Visit | Attending: Internal Medicine

## 2012-09-26 DIAGNOSIS — I2510 Atherosclerotic heart disease of native coronary artery without angina pectoris: Secondary | ICD-10-CM

## 2012-09-26 DIAGNOSIS — I25810 Atherosclerosis of coronary artery bypass graft(s) without angina pectoris: Secondary | ICD-10-CM

## 2012-09-26 LAB — CBC
Hemoglobin: 13.7 g/dL (ref 13.0–17.0)
MCH: 29.3 pg (ref 26.0–34.0)
MCHC: 34.1 g/dL (ref 30.0–36.0)
Platelets: 223 10*3/uL (ref 150–400)
RDW: 14.2 % (ref 11.5–15.5)

## 2012-09-26 LAB — GLUCOSE, CAPILLARY
Glucose-Capillary: 202 mg/dL — ABNORMAL HIGH (ref 70–99)
Glucose-Capillary: 271 mg/dL — ABNORMAL HIGH (ref 70–99)
Glucose-Capillary: 161 mg/dL — ABNORMAL HIGH (ref 70–99)

## 2012-09-26 LAB — BASIC METABOLIC PANEL WITH GFR
BUN: 20 mg/dL (ref 6–23)
CO2: 23 meq/L (ref 19–32)
Calcium: 9.1 mg/dL (ref 8.4–10.5)
Chloride: 99 meq/L (ref 96–112)
Creatinine, Ser: 1.05 mg/dL (ref 0.50–1.35)
GFR calc Af Amer: 76 mL/min — ABNORMAL LOW
GFR calc non Af Amer: 66 mL/min — ABNORMAL LOW
Glucose, Bld: 217 mg/dL — ABNORMAL HIGH (ref 70–99)
Potassium: 4.3 meq/L (ref 3.5–5.1)
Sodium: 133 meq/L — ABNORMAL LOW (ref 135–145)

## 2012-09-26 LAB — APTT: aPTT: 33 seconds (ref 24–37)

## 2012-09-26 LAB — FOLATE RBC: RBC Folate: 578 ng/mL (ref >=366)

## 2012-09-26 LAB — PROTIME-INR
INR: 1.29 (ref 0.00–1.49)
Prothrombin Time: 15.8 seconds — ABNORMAL HIGH (ref 11.6–15.2)

## 2012-09-26 SURGERY — LAPAROSCOPIC CHOLECYSTECTOMY WITH INTRAOPERATIVE CHOLANGIOGRAM
Anesthesia: General

## 2012-09-26 MED ORDER — TECHNETIUM TC 99M SESTAMIBI GENERIC - CARDIOLITE
30.0000 | Freq: Once | INTRAVENOUS | Status: AC | PRN
  Administered 2012-09-26: 30 via INTRAVENOUS

## 2012-09-26 MED ORDER — HEPARIN SODIUM (PORCINE) 5000 UNIT/ML IJ SOLN
5000.0000 [IU] | Freq: Three times a day (TID) | INTRAMUSCULAR | Status: AC
Start: 2012-09-26 — End: 2012-09-26
  Administered 2012-09-26 (×2): 5000 [IU] via SUBCUTANEOUS
  Filled 2012-09-26 (×2): qty 1

## 2012-09-26 MED ORDER — METOPROLOL SUCCINATE ER 50 MG PO TB24
50.0000 mg | ORAL_TABLET | Freq: Two times a day (BID) | ORAL | Status: DC
  Administered 2012-09-26 – 2012-09-29 (×5): 50 mg via ORAL
  Filled 2012-09-26 (×7): qty 1

## 2012-09-26 MED ORDER — TECHNETIUM TC 99M SESTAMIBI GENERIC - CARDIOLITE
10.0000 | Freq: Once | INTRAVENOUS | Status: AC | PRN
  Administered 2012-09-26: 10 via INTRAVENOUS

## 2012-09-26 MED ORDER — METOPROLOL TARTRATE 25 MG PO TABS
25.0000 mg | ORAL_TABLET | Freq: Once | ORAL | Status: AC
  Administered 2012-09-26: 25 mg via ORAL
  Filled 2012-09-26: qty 1

## 2012-09-26 NOTE — Progress Notes (Signed)
We have been awaiting cardiac clearance and Lexiscan.  Hopefully, cholecystectomy on Thurs.

## 2012-09-26 NOTE — Progress Notes (Signed)
OT Cancellation Note  Patient Details Name: [name] MRN: [id number] DOB: Aug 02, 1931   Cancelled Treatment:    Reason Eval/Treat Not Completed: Patient at procedure or test/ unavailable  [name], [place] Kung 09/26/2012, 9:50 AM

## 2012-09-26 NOTE — Progress Notes (Addendum)
Patient ID: [name], male   DOB: [birth year], 79 y.o.   MRN: [id number]   SUBJECTIVE:  Complete consult note was done yesterday. The echo done yesterday revealed an ejection fraction of 50-55%. There were no focal wall motion abnormalities. The patient is scheduled to have a Myoview scan today.   Filed Vitals:   09/25/12 1401 09/25/12 1958 09/25/12 2301 09/26/12 0616  BP: 172/84 183/82 169/90 158/87  Pulse: 85 86  78  Temp: 97.3 F (36.3 C) 98.3 F (36.8 C)  98.2 F (36.8 C)  TempSrc: Oral Oral  Oral  Resp: 18 18  18   Height:      Weight:    147 lb 0.8 oz (66.7 kg)  SpO2: 98% 97%  97%    Intake/Output Summary (Last 24 hours) at 09/26/12 1031 Last data filed at 09/25/12 2100  Gross per 24 hour  Intake    720 ml  Output      0 ml  Net    720 ml    LABS: Basic Metabolic Panel:  Recent Labs  91/47/82 1920 09/25/12 0656 09/26/12 0545  NA 134* 137 133*  K 4.4 4.3 4.3  CL 99 101 99  CO2 24 27 23   GLUCOSE 216* 207* 217*  BUN 32* 25* 20  CREATININE 1.25 1.07 1.05  CALCIUM 9.3 9.2 9.1  MG  --  2.0  --   PHOS  --  3.0  --    Liver Function Tests:  Recent Labs  09/24/12 1920 09/25/12 0656  AST 22 19  ALT 31 26  ALKPHOS 52 45  BILITOT 0.4 0.8  PROT 7.7 7.5  ALBUMIN 3.3* 3.2*    Recent Labs  09/24/12 1920 09/25/12 0656  LIPASE 124* 110*   CBC:  Recent Labs  09/24/12 1920 09/25/12 0656 09/26/12 0545  WBC 7.2 6.4 6.2  NEUTROABS 4.6  --   --   HGB 13.7 13.7 13.7  HCT 40.0 41.1 40.2  MCV 85.8 85.8 86.1  PLT 252 246 223   Cardiac Enzymes:  Recent Labs  09/25/12 0121 09/25/12 0656 09/25/12 1309  TROPONINI <0.30 <0.30 <0.30   BNP: No components found with this basename: POCBNP,  D-Dimer: No results found for this basename: DDIMER,  in the last 72 hours Hemoglobin A1C:  Recent Labs  09/25/12 0656  HGBA1C 9.0*   Fasting Lipid Panel:  Recent Labs  09/25/12 0656  CHOL 145  HDL 39*  LDLCALC 89  TRIG 86  CHOLHDL 3.7   Thyroid  Function Tests:  Recent Labs  09/25/12 0656  TSH 3.766    RADIOLOGY: Dg Chest 2 View  09/24/2012  *RADIOLOGY REPORT*  Clinical Data: Chest pain.  Weakness. Shortness of breath.  CHEST - 2 VIEW  Comparison: 09/07/2010.  Findings:  Post CABG.  Heart size top normal.  Chronic lung changes with central pulmonary vascular prominence but without frank pulmonary edema, segmental infiltrate or pneumothorax.  Calcified mildly tortuous aorta.  IMPRESSION: No acute abnormality.  Please see above.   Original Report Authenticated By: Lacy Duverney, M.D.    Ct Head Wo Contrast  09/24/2012  *RADIOLOGY REPORT*  Clinical Data: Dizziness.  Generalized weakness.  Current history of hypertension.  CT HEAD WITHOUT CONTRAST  Technique:  Contiguous axial images were obtained from the base of the skull through the vertex without contrast.  Comparison: Unenhanced cranial CT 12/18/2006.  Findings: Moderate cortical and deep atrophy and mild to moderate cerebellar atrophy, unchanged.  Moderate to severe changes of small vessel  disease of the white matter diffusely, unchanged.  Old lacunar strokes in the right basal ganglia, unchanged.  No mass lesion.  No midline shift.  No acute hemorrhage or hematoma.  No extra-axial fluid collections.  No evidence of acute infarction. No significant interval change.  No skull fracture or other focal osseous abnormality involving the skull.  Opacification of both maxillary sinuses, right greater than left, with air-fluid levels.  Opacification of scattered ethmoid air cells bilaterally.  Inspissated mucous in the right sphenoid sinus with a small fluid level.  Bilateral mastoid air cells and middle ear cavities well-aerated.  IMPRESSION:  1.  No acute intracranial abnormality. 2.  Stable moderate generalized atrophy, moderate to severe chronic microvascular ischemic changes of the white matter, and old lacunar strokes in the right basal ganglia. 3.  Acute bilateral maxillary sinusitis, right  greater than left. Bilateral ethmoid sinusitis which may be acute or chronic.  Mild acute right sphenoid sinusitis.   Original Report Authenticated By: Hulan Saas, M.D.    US Abdomen Complete  09/24/2012  *RADIOLOGY REPORT*  Clinical Data:  Abdominal pain.  Elevated lipase and  BUN. Diabetic hypertensive patient.  COMPLETE ABDOMINAL ULTRASOUND  Comparison:  None.  Findings:  Gallbladder:  Echogenic shadowing in the gallbladder fossa suggestive of gallstones.  Gallbladder wall thickening of the 3.7 mm.  Per ultrasound technologist, the patient was not tender over this region during scanning.  Common bile duct:  6.5 mm proximally.  The mid and distal aspect not visualized secondary to bowel gas.  Liver:  No focal lesion identified.  Within normal limits in parenchymal echogenicity.  IVC:  Appears normal.  Pancreas:  Not visualized secondary to bowel gas.  Spleen:  8.9 cm.  Accessory splenic tissue noted.  Right Kidney:  9.1 cm. No hydronephrosis or renal mass.Mild increased echogenicity may represent result of medical renal disease type changes.  Left Kidney:  10.7 cm.  No hydronephrosis or renal mass.Mild increased echogenicity may represent result of medical renal disease type changes.  Abdominal aorta:  Ectatic and calcified consistent with atherosclerotic type changes.  The full extent of the aorta is not imaged secondary to bowel gas.  Portions visualized with maximal AP dimension 2.8 cm.  IMPRESSION: Echogenic shadowing in the gallbladder fossa suggestive of gallstones.  Gallbladder wall thickening of the 3.7 mm.  Pancreas, portions of the abdominal aorta and mid to distal common bile duct not visualized secondary to bowel gas.  Increased echogenicity of renal parenchyma may represent result of medical renal disease type changes.   Original Report Authenticated By: Lacy Duverney, M.D.     ASSESSMENT AND PLAN:    CAD (coronary artery disease)     I have reviewed the nuclear scan. There is normal wall  motion. There is no definite ischemia. The ejection fraction is normal. It is a low risk scan. At this point there is no evidence that the patient's cardiac status is unstable. He is cleared for any type of surgery needed.    Hx of CABG   [name] 09/26/2012   2:45PM

## 2012-09-26 NOTE — Progress Notes (Signed)
Patient ID: [name], male   DOB: 07-23-1931, 79 y.o.   MRN: [id number]    Subjective: Pt feels ok today, still with some mild pain with palpation in his abdomen  Objective: Vital signs in last 24 hours: Temp:  [97.3 F (36.3 C)-98.3 F (36.8 C)] 98.2 F (36.8 C) (03/19 0616) Pulse Rate:  [78-86] 78 (03/19 0616) Resp:  [18] 18 (03/19 0616) BP: (158-183)/(82-90) 158/87 mmHg (03/19 0616) SpO2:  [97 %-98 %] 97 % (03/19 0616) Weight:  [147 lb 0.8 oz (66.7 kg)] 147 lb 0.8 oz (66.7 kg) (03/19 0616) Last BM Date: 09/24/12  Intake/Output from previous day: 03/18 0701 - 03/19 0700 In: 960 [P.O.:960] Out: -  Intake/Output this shift:    PE: Abd: soft, minimally tender, +BS, ND  Lab Results:   Recent Labs  09/25/12 0656 09/26/12 0545  WBC 6.4 6.2  HGB 13.7 13.7  HCT 41.1 40.2  PLT 246 223   BMET  Recent Labs  09/25/12 0656 09/26/12 0545  NA 137 133*  K 4.3 4.3  CL 101 99  CO2 27 23  GLUCOSE 207* 217*  BUN 25* 20  CREATININE 1.07 1.05  CALCIUM 9.2 9.1   PT/INR  Recent Labs  09/25/12 0656 09/26/12 0545  LABPROT 25.7* 15.8*  INR 2.48* 1.29   CMP     Component Value Date/Time   NA 133* 09/26/2012 0545   K 4.3 09/26/2012 0545   CL 99 09/26/2012 0545   CO2 23 09/26/2012 0545   GLUCOSE 217* 09/26/2012 0545   BUN 20 09/26/2012 0545   CREATININE 1.05 09/26/2012 0545   CALCIUM 9.1 09/26/2012 0545   PROT 7.5 09/25/2012 0656   ALBUMIN 3.2* 09/25/2012 0656   AST 19 09/25/2012 0656   ALT 26 09/25/2012 0656   ALKPHOS 45 09/25/2012 0656   BILITOT 0.8 09/25/2012 0656   GFRNONAA 66* 09/26/2012 0545   GFRAA 76* 09/26/2012 0545   Lipase     Component Value Date/Time   LIPASE 110* 09/25/2012 0656       Studies/Results: Dg Chest 2 View  09/24/2012  *RADIOLOGY REPORT*  Clinical Data: Chest pain.  Weakness. Shortness of breath.  CHEST - 2 VIEW  Comparison: 09/07/2010.  Findings:  Post CABG.  Heart size top normal.  Chronic lung changes with central pulmonary vascular  prominence but without frank pulmonary edema, segmental infiltrate or pneumothorax.  Calcified mildly tortuous aorta.  IMPRESSION: No acute abnormality.  Please see above.   Original Report Authenticated By: Lacy Duverney, M.D.    Ct Head Wo Contrast  09/24/2012  *RADIOLOGY REPORT*  Clinical Data: Dizziness.  Generalized weakness.  Current history of hypertension.  CT HEAD WITHOUT CONTRAST  Technique:  Contiguous axial images were obtained from the base of the skull through the vertex without contrast.  Comparison: Unenhanced cranial CT 12/18/2006.  Findings: Moderate cortical and deep atrophy and mild to moderate cerebellar atrophy, unchanged.  Moderate to severe changes of small vessel disease of the white matter diffusely, unchanged.  Old lacunar strokes in the right basal ganglia, unchanged.  No mass lesion.  No midline shift.  No acute hemorrhage or hematoma.  No extra-axial fluid collections.  No evidence of acute infarction. No significant interval change.  No skull fracture or other focal osseous abnormality involving the skull.  Opacification of both maxillary sinuses, right greater than left, with air-fluid levels.  Opacification of scattered ethmoid air cells bilaterally.  Inspissated mucous in the right sphenoid sinus with a small fluid level.  Bilateral mastoid air cells and middle ear cavities well-aerated.  IMPRESSION:  1.  No acute intracranial abnormality. 2.  Stable moderate generalized atrophy, moderate to severe chronic microvascular ischemic changes of the white matter, and old lacunar strokes in the right basal ganglia. 3.  Acute bilateral maxillary sinusitis, right greater than left. Bilateral ethmoid sinusitis which may be acute or chronic.  Mild acute right sphenoid sinusitis.   Original Report Authenticated By: Hulan Saas, M.D.    US Abdomen Complete  09/24/2012  *RADIOLOGY REPORT*  Clinical Data:  Abdominal pain.  Elevated lipase and  BUN. Diabetic hypertensive patient.  COMPLETE  ABDOMINAL ULTRASOUND  Comparison:  None.  Findings:  Gallbladder:  Echogenic shadowing in the gallbladder fossa suggestive of gallstones.  Gallbladder wall thickening of the 3.7 mm.  Per ultrasound technologist, the patient was not tender over this region during scanning.  Common bile duct:  6.5 mm proximally.  The mid and distal aspect not visualized secondary to bowel gas.  Liver:  No focal lesion identified.  Within normal limits in parenchymal echogenicity.  IVC:  Appears normal.  Pancreas:  Not visualized secondary to bowel gas.  Spleen:  8.9 cm.  Accessory splenic tissue noted.  Right Kidney:  9.1 cm. No hydronephrosis or renal mass.Mild increased echogenicity may represent result of medical renal disease type changes.  Left Kidney:  10.7 cm.  No hydronephrosis or renal mass.Mild increased echogenicity may represent result of medical renal disease type changes.  Abdominal aorta:  Ectatic and calcified consistent with atherosclerotic type changes.  The full extent of the aorta is not imaged secondary to bowel gas.  Portions visualized with maximal AP dimension 2.8 cm.  IMPRESSION: Echogenic shadowing in the gallbladder fossa suggestive of gallstones.  Gallbladder wall thickening of the 3.7 mm.  Pancreas, portions of the abdominal aorta and mid to distal common bile duct not visualized secondary to bowel gas.  Increased echogenicity of renal parenchyma may represent result of medical renal disease type changes.   Original Report Authenticated By: Lacy Duverney, M.D.     Anti-infectives: Anti-infectives   Start     Dose/Rate Route Frequency Ordered Stop   09/25/12 0130  amoxicillin-clavulanate (AUGMENTIN) 875-125 MG per tablet 1 tablet     1 tablet Oral Every 12 hours 09/25/12 0116         Assessment/Plan  1. Biliary colic 2. CAD  Plan: 1. Agree with cardiac workup and lexiscan prior to surgery. 2. If he is cleared, we will proceed with cholecystectomy in the next day or so.   LOS: 2 days     Sreshta Cressler E 09/26/2012, 8:08 AM Pager: [phone]

## 2012-09-26 NOTE — Progress Notes (Signed)
PT Cancellation Note  Patient Details Name: [name] MRN: [id number] DOB: 31-Jan-1932   Cancelled Treatment:    Reason Eval/Treat Not Completed: Medical issues which prohibited therapy  Pt HR when up jumps into the 150'160's.  RN request defer. 09/26/2012  Bristol Bing, PT [phone] (pager)   Shelbie Franken, Eliseo Gum 09/26/2012, 3:49 PM

## 2012-09-26 NOTE — Progress Notes (Signed)
[facility] PROGRESS NOTE  [name] ZOX:[id number] DOB: June 03, 1932 DOA: 09/24/2012 PCP: Pcp Not In System  Brief Narrative: [name] is a 79 y.o. male with a past medical history of Atrial fibrillation; Hypotension; HNP (herniated nucleus pulposus), lumbar; PVD (peripheral vascular disease); Diabetes mellitus; CAD (coronary artery disease); and Ventricular hypertrophy. Patient moved here from [place] since 2006 he has been non-compliant with his medications and his daughter is worried that he might be depressed. While visiting to [place] he had a suicidal attempt in 2013. He was diagnosed with situational depression but he refuses to take his medication. He has been crying out at night. Patient is straight about dying he is constantly feeling his pulse to see if his heart was still beating. Patient has been complaining about epigastric pain that has been there for the past 2-3 years but it seems to get worse recently. His lipase was slightly elevated to 125 and in emergency department US showed cholelithiasis but no biliary dilatation. No nausea or vomiting no liver function abnormalities. He reports some dizziness and sensation that he is unsteady on his feet this has been going on for few years but now it is worse. Today he stated that he feels short of breath and having chest pain and feeling like his is going to die. Patient is being vague with his symptoms. Family states he has been having a lot of anxiety and wants to take extra medication to help his symptoms. He's been trying to take extra Coumadin although only slightly. His daughter has taken over his medications because she is concerned he may be overmedicating himself. Patient has history of coronary artery disease and while visiting to [place] in 2004 have had a quadruple bypass. They currently do not have primary care provider but is scheduled to see [facility] cardiology in April.  Assessment/Plan: Epigastric pain -  likely related to cholelithiasis.  - surgery consulted, plan for chole 3/20 - cardiology consult for peri-op management   Chest pain - seems like this is more epigastric than true chest pain.  - 2D echo done, read below - troponins negative x 3  HYPERTENSION, BENIGN - patient does not take all his medications consistently will continue his diltiazem but also add enalapril   FIBRILLATION, ATRIAL - rate controlled on diltiazem continued digoxin. Digoxin level was not supratherapeutic.  - hold Coumadin for planned surgery.  DIABETES MELLITUS -apparently is not taking anything for it but watch blood sugars and put on sliding scale.   Sinusitis - evidence of acute sinusitis we'll cover with Augmentin   Unsteady gait - This is not an acute change and has been going on for years is no evidence of recent CVA but a history of past CVAs evident on the CT scan. We'll make sure he is on aspirin continue Coumadin. Order PT OT evaluation to further evaluate his symptoms and needs at home.   Possible depression - would recommend either psychiatry followup as an outpatient or inpatient consult. For now will initiate on Cymbalta and monitor. TSH normal. vitamin B 12 normal. Folate normal.  Code Status: Full Family Communication: none today [name], [phone])  Disposition Plan: surgery tomorrow  Consultants:  Surgery  Cardiology  Procedures:  Myoview - pending  2D echo Study Conclusions  - Left ventricle: The cavity size was normal. Wall thickness was increased in a pattern of mild LVH. Systolic function was normal. The estimated ejection fraction was in the range of 50% to 55%. Wall motion was normal; there  were no regional wall motion abnormalities. - Left atrium: The atrium was moderately to severely dilated. - Right atrium: The atrium was moderately dilated. - Tricuspid valve: Moderate regurgitation. - Pulmonary arteries: PA peak pressure: 43mm Hg  (S).    Antibiotics:  Augmentin 3/17 >>  HPI/Subjective: - has no pain, much more upbeat today, smiling   Objective: Filed Vitals:   09/26/12 1151 09/26/12 1300 09/26/12 1305 09/26/12 1306  BP: 155/66 169/94 169/94 169/94  Pulse:    74  Temp:  98 F (36.7 C)    TempSrc:  Oral    Resp:  17    Height:      Weight:      SpO2:  99%      Intake/Output Summary (Last 24 hours) at 09/26/12 1318 Last data filed at 09/25/12 2100  Gross per 24 hour  Intake    480 ml  Output      0 ml  Net    480 ml   Filed Weights   09/25/12 0132 09/26/12 0616  Weight: 68.4 kg (150 lb 12.7 oz) 66.7 kg (147 lb 0.8 oz)    Exam:   General:  NAD  Cardiovascular: iregular, without MRG  Respiratory: good air movement, clear to auscultation throughout, no wheezing, ronchi or rales  Abdomen: soft, mildly tender to palpation in the epigastric area, positive bowel sounds  MSK: no peripheral edema  Neuro: CN 2-12 grossly intact, MS 5/5 in all 4  Data Reviewed: Basic Metabolic Panel:  Recent Labs Lab 09/24/12 1920 09/25/12 0656 09/26/12 0545  NA 134* 137 133*  K 4.4 4.3 4.3  CL 99 101 99  CO2 24 27 23   GLUCOSE 216* 207* 217*  BUN 32* 25* 20  CREATININE 1.25 1.07 1.05  CALCIUM 9.3 9.2 9.1  MG  --  2.0  --   PHOS  --  3.0  --    Liver Function Tests:  Recent Labs Lab 09/24/12 1920 09/25/12 0656  AST 22 19  ALT 31 26  ALKPHOS 52 45  BILITOT 0.4 0.8  PROT 7.7 7.5  ALBUMIN 3.3* 3.2*    Recent Labs Lab 09/24/12 1920 09/25/12 0656  LIPASE 124* 110*   CBC:  Recent Labs Lab 09/24/12 1920 09/25/12 0656 09/26/12 0545  WBC 7.2 6.4 6.2  NEUTROABS 4.6  --   --   HGB 13.7 13.7 13.7  HCT 40.0 41.1 40.2  MCV 85.8 85.8 86.1  PLT 252 246 223   Cardiac Enzymes:  Recent Labs Lab 09/24/12 1921 09/25/12 0121 09/25/12 0656 09/25/12 1309  TROPONINI <0.30 <0.30 <0.30 <0.30   CBG:  Recent Labs Lab 09/25/12 1124 09/25/12 1624 09/25/12 2145 09/26/12 0614  09/26/12 1315  GLUCAP 210* 281* 194* 202* 271*   Studies: Dg Chest 2 View  09/24/2012  *RADIOLOGY REPORT*  Clinical Data: Chest pain.  Weakness. Shortness of breath.  CHEST - 2 VIEW  Comparison: 09/07/2010.  Findings:  Post CABG.  Heart size top normal.  Chronic lung changes with central pulmonary vascular prominence but without frank pulmonary edema, segmental infiltrate or pneumothorax.  Calcified mildly tortuous aorta.  IMPRESSION: No acute abnormality.  Please see above.   Original Report Authenticated By: Lacy Duverney, M.D.    Ct Head Wo Contrast  09/24/2012  *RADIOLOGY REPORT*  Clinical Data: Dizziness.  Generalized weakness.  Current history of hypertension.  CT HEAD WITHOUT CONTRAST  Technique:  Contiguous axial images were obtained from the base of the skull through the vertex without contrast.  Comparison: Unenhanced cranial CT 12/18/2006.  Findings: Moderate cortical and deep atrophy and mild to moderate cerebellar atrophy, unchanged.  Moderate to severe changes of small vessel disease of the white matter diffusely, unchanged.  Old lacunar strokes in the right basal ganglia, unchanged.  No mass lesion.  No midline shift.  No acute hemorrhage or hematoma.  No extra-axial fluid collections.  No evidence of acute infarction. No significant interval change.  No skull fracture or other focal osseous abnormality involving the skull.  Opacification of both maxillary sinuses, right greater than left, with air-fluid levels.  Opacification of scattered ethmoid air cells bilaterally.  Inspissated mucous in the right sphenoid sinus with a small fluid level.  Bilateral mastoid air cells and middle ear cavities well-aerated.  IMPRESSION:  1.  No acute intracranial abnormality. 2.  Stable moderate generalized atrophy, moderate to severe chronic microvascular ischemic changes of the white matter, and old lacunar strokes in the right basal ganglia. 3.  Acute bilateral maxillary sinusitis, right greater than left.  Bilateral ethmoid sinusitis which may be acute or chronic.  Mild acute right sphenoid sinusitis.   Original Report Authenticated By: Hulan Saas, M.D.    US Abdomen Complete  09/24/2012  *RADIOLOGY REPORT*  Clinical Data:  Abdominal pain.  Elevated lipase and  BUN. Diabetic hypertensive patient.  COMPLETE ABDOMINAL ULTRASOUND  Comparison:  None.  Findings:  Gallbladder:  Echogenic shadowing in the gallbladder fossa suggestive of gallstones.  Gallbladder wall thickening of the 3.7 mm.  Per ultrasound technologist, the patient was not tender over this region during scanning.  Common bile duct:  6.5 mm proximally.  The mid and distal aspect not visualized secondary to bowel gas.  Liver:  No focal lesion identified.  Within normal limits in parenchymal echogenicity.  IVC:  Appears normal.  Pancreas:  Not visualized secondary to bowel gas.  Spleen:  8.9 cm.  Accessory splenic tissue noted.  Right Kidney:  9.1 cm. No hydronephrosis or renal mass.Mild increased echogenicity may represent result of medical renal disease type changes.  Left Kidney:  10.7 cm.  No hydronephrosis or renal mass.Mild increased echogenicity may represent result of medical renal disease type changes.  Abdominal aorta:  Ectatic and calcified consistent with atherosclerotic type changes.  The full extent of the aorta is not imaged secondary to bowel gas.  Portions visualized with maximal AP dimension 2.8 cm.  IMPRESSION: Echogenic shadowing in the gallbladder fossa suggestive of gallstones.  Gallbladder wall thickening of the 3.7 mm.  Pancreas, portions of the abdominal aorta and mid to distal common bile duct not visualized secondary to bowel gas.  Increased echogenicity of renal parenchyma may represent result of medical renal disease type changes.   Original Report Authenticated By: Lacy Duverney, M.D.    Scheduled Meds: . amoxicillin-clavulanate  1 tablet Oral Q12H  . aspirin EC  81 mg Oral Daily  . atorvastatin  20 mg Oral q1800  .  digoxin  0.125 mg Oral Daily  . docusate sodium  100 mg Oral BID  . DULoxetine  20 mg Oral Daily  . enalapril  2.5 mg Oral Daily  . fluticasone  1 spray Each Nare BID  . insulin aspart  0-5 Units Subcutaneous QHS  . insulin aspart  0-9 Units Subcutaneous TID WC  . metoprolol succinate  25 mg Oral BID  . pantoprazole  40 mg Oral Q1200  . senna  1 tablet Oral BID  . sodium chloride  3 mL Intravenous Q12H  . sodium chloride  3 mL Intravenous Q12H   Continuous Infusions:    Active Problems:   DIABETES MELLITUS   HYPERTENSION, BENIGN   FIBRILLATION, ATRIAL   Chest pain   Cholelithiases   Sinusitis   Unsteady gait   CAD (coronary artery disease)   Hx of CABG   Pamella Pert, MD [facility] Pager [phone]. If 7 PM - 7 AM, please contact night-coverage at [email or web address], password [facility] 09/26/2012, 1:18 PM  LOS: 2 days

## 2012-09-26 NOTE — Progress Notes (Signed)
Patient: [name] / Admit Date: 09/24/2012 / Date of Encounter: 09/26/2012, 11:31 AM   Subjective  Interpreter line used at request of daughter (bosnian). Denies any symptoms. Lexiscan myoview completed without complication. Await results.   Objective   Telemetry: unable to review as pt seen down in nuc lab. Physical Exam: Filed Vitals:   09/26/12 0616  BP: 158/87  Pulse: 78  Temp: 98.2 F (36.8 C)  Resp: 18   General: Well developed, well nourished, in no acute distress. Head: Normocephalic, atraumatic, sclera non-icteric, no xanthomas, nares are without discharge. Neck: Negative for carotid bruits. JVD not elevated. Lungs: Clear bilaterally to auscultation without wheezes, rales, or rhonchi. Breathing is unlabored. Heart: Irregularly irregular, rate controlled S1 S2 without murmurs, rubs, or gallops.  Abdomen: Soft, non-tender with normoactive bowel sounds. Msk:  Strength and tone appear normal for age. Extremities: No clubbing or cyanosis. No edema.  Distal pedal pulses are 2+ and equal bilaterally. Neuro: Alert and oriented, moves all extremities spontaneously.    Intake/Output Summary (Last 24 hours) at 09/26/12 1131 Last data filed at 09/25/12 2100  Gross per 24 hour  Intake    720 ml  Output      0 ml  Net    720 ml    Inpatient Medications:  . amoxicillin-clavulanate  1 tablet Oral Q12H  . aspirin EC  81 mg Oral Daily  . atorvastatin  20 mg Oral q1800  . digoxin  0.125 mg Oral Daily  . docusate sodium  100 mg Oral BID  . DULoxetine  20 mg Oral Daily  . enalapril  2.5 mg Oral Daily  . fluticasone  1 spray Each Nare BID  . insulin aspart  0-5 Units Subcutaneous QHS  . insulin aspart  0-9 Units Subcutaneous TID WC  . metoprolol succinate  25 mg Oral BID  . pantoprazole  40 mg Oral Q1200  . regadenoson  0.4 mg Intravenous Once  . senna  1 tablet Oral BID  . sodium chloride  3 mL Intravenous Q12H  . sodium chloride  3 mL Intravenous Q12H     Labs:  Recent Labs  09/24/12 1920 09/25/12 0656 09/26/12 0545  NA 134* 137 133*  K 4.4 4.3 4.3  CL 99 101 99  CO2 24 27 23   GLUCOSE 216* 207* 217*  BUN 32* 25* 20  CREATININE 1.25 1.07 1.05  CALCIUM 9.3 9.2 9.1  MG  --  2.0  --   PHOS  --  3.0  --     Recent Labs  09/24/12 1920 09/25/12 0656  AST 22 19  ALT 31 26  ALKPHOS 52 45  BILITOT 0.4 0.8  PROT 7.7 7.5  ALBUMIN 3.3* 3.2*    Recent Labs  09/24/12 1920 09/25/12 0656 09/26/12 0545  WBC 7.2 6.4 6.2  NEUTROABS 4.6  --   --   HGB 13.7 13.7 13.7  HCT 40.0 41.1 40.2  MCV 85.8 85.8 86.1  PLT 252 246 223    Recent Labs  09/24/12 1921 09/25/12 0121 09/25/12 0656 09/25/12 1309  TROPONINI <0.30 <0.30 <0.30 <0.30   No components found with this basename: POCBNP,   Recent Labs  09/25/12 0656  HGBA1C 9.0*    Recent Labs  09/25/12 0656  CHOL 145  HDL 39*  LDLCALC 89  TRIG 86  CHOLHDL 3.7    Radiology/Studies:  Dg Chest 2 View  09/24/2012  *RADIOLOGY REPORT*  Clinical Data: Chest pain.  Weakness. Shortness of breath.  CHEST -  2 VIEW  Comparison: 09/07/2010.  Findings:  Post CABG.  Heart size top normal.  Chronic lung changes with central pulmonary vascular prominence but without frank pulmonary edema, segmental infiltrate or pneumothorax.  Calcified mildly tortuous aorta.  IMPRESSION: No acute abnormality.  Please see above.   Original Report Authenticated By: Lacy Duverney, M.D.    Ct Head Wo Contrast  09/24/2012  *RADIOLOGY REPORT*  Clinical Data: Dizziness.  Generalized weakness.  Current history of hypertension.  CT HEAD WITHOUT CONTRAST  Technique:  Contiguous axial images were obtained from the base of the skull through the vertex without contrast.  Comparison: Unenhanced cranial CT 12/18/2006.  Findings: Moderate cortical and deep atrophy and mild to moderate cerebellar atrophy, unchanged.  Moderate to severe changes of small vessel disease of the white matter diffusely, unchanged.  Old  lacunar strokes in the right basal ganglia, unchanged.  No mass lesion.  No midline shift.  No acute hemorrhage or hematoma.  No extra-axial fluid collections.  No evidence of acute infarction. No significant interval change.  No skull fracture or other focal osseous abnormality involving the skull.  Opacification of both maxillary sinuses, right greater than left, with air-fluid levels.  Opacification of scattered ethmoid air cells bilaterally.  Inspissated mucous in the right sphenoid sinus with a small fluid level.  Bilateral mastoid air cells and middle ear cavities well-aerated.  IMPRESSION:  1.  No acute intracranial abnormality. 2.  Stable moderate generalized atrophy, moderate to severe chronic microvascular ischemic changes of the white matter, and old lacunar strokes in the right basal ganglia. 3.  Acute bilateral maxillary sinusitis, right greater than left. Bilateral ethmoid sinusitis which may be acute or chronic.  Mild acute right sphenoid sinusitis.   Original Report Authenticated By: Hulan Saas, M.D.    US Abdomen Complete  09/24/2012  *RADIOLOGY REPORT*  Clinical Data:  Abdominal pain.  Elevated lipase and  BUN. Diabetic hypertensive patient.  COMPLETE ABDOMINAL ULTRASOUND  Comparison:  None.  Findings:  Gallbladder:  Echogenic shadowing in the gallbladder fossa suggestive of gallstones.  Gallbladder wall thickening of the 3.7 mm.  Per ultrasound technologist, the patient was not tender over this region during scanning.  Common bile duct:  6.5 mm proximally.  The mid and distal aspect not visualized secondary to bowel gas.  Liver:  No focal lesion identified.  Within normal limits in parenchymal echogenicity.  IVC:  Appears normal.  Pancreas:  Not visualized secondary to bowel gas.  Spleen:  8.9 cm.  Accessory splenic tissue noted.  Right Kidney:  9.1 cm. No hydronephrosis or renal mass.Mild increased echogenicity may represent result of medical renal disease type changes.  Left Kidney:   10.7 cm.  No hydronephrosis or renal mass.Mild increased echogenicity may represent result of medical renal disease type changes.  Abdominal aorta:  Ectatic and calcified consistent with atherosclerotic type changes.  The full extent of the aorta is not imaged secondary to bowel gas.  Portions visualized with maximal AP dimension 2.8 cm.  IMPRESSION: Echogenic shadowing in the gallbladder fossa suggestive of gallstones.  Gallbladder wall thickening of the 3.7 mm.  Pancreas, portions of the abdominal aorta and mid to distal common bile duct not visualized secondary to bowel gas.  Increased echogenicity of renal parenchyma may represent result of medical renal disease type changes.   Original Report Authenticated By: Lacy Duverney, M.D.      Assessment and Plan  1. Gallstone pancreatitis  2. Cholelithiasis  3. CAD s/p CABG  4. Permanent atrial  fibrillation, on Coumadin prior to admission 5. Type 2 DM  6. PVD  7. Uncontrolled HTN  8. Medical noncompliance  9. Depression with prior suicide attempts  10. Sinusitis  Pt seen briefly in nuc lab. MD to follow. Lexiscan Myoview performed and results pending.  Signed, Dayna Dunn PA-C Jerral Bonito, MD

## 2012-09-27 ENCOUNTER — Inpatient Hospital Stay (HOSPITAL_COMMUNITY): Payer: Medicaid Other | Admitting: Anesthesiology

## 2012-09-27 ENCOUNTER — Encounter (HOSPITAL_COMMUNITY)
Admission: EM | Disposition: A | Discharge status: Home / Self Care | Payer: Self-pay | Source: Ambulatory Visit | Attending: Internal Medicine

## 2012-09-27 ENCOUNTER — Inpatient Hospital Stay (HOSPITAL_COMMUNITY): Payer: Medicaid Other

## 2012-09-27 ENCOUNTER — Encounter (HOSPITAL_COMMUNITY): Payer: Self-pay | Admitting: Anesthesiology

## 2012-09-27 DIAGNOSIS — Z9049 Acquired absence of other specified parts of digestive tract: Secondary | ICD-10-CM | POA: Insufficient documentation

## 2012-09-27 HISTORY — PX: CHOLECYSTECTOMY: SHX55

## 2012-09-27 LAB — GLUCOSE, CAPILLARY: Glucose-Capillary: 213 mg/dL — ABNORMAL HIGH (ref 70–99)

## 2012-09-27 SURGERY — LAPAROSCOPIC CHOLECYSTECTOMY WITH INTRAOPERATIVE CHOLANGIOGRAM
Anesthesia: General | Site: Abdomen | Wound class: Clean Contaminated

## 2012-09-27 MED ORDER — LIDOCAINE-EPINEPHRINE (PF) 1 %-1:200000 IJ SOLN
INTRAMUSCULAR | Status: DC | PRN
  Administered 2012-09-27: 09:00:00

## 2012-09-27 MED ORDER — LABETALOL HCL 5 MG/ML IV SOLN
INTRAVENOUS | Status: DC | PRN
  Administered 2012-09-27 (×2): 5 mg via INTRAVENOUS

## 2012-09-27 MED ORDER — LACTATED RINGERS IV SOLN
INTRAVENOUS | Status: DC | PRN
  Administered 2012-09-27 (×2): via INTRAVENOUS

## 2012-09-27 MED ORDER — PHENYLEPHRINE HCL 10 MG/ML IJ SOLN
INTRAMUSCULAR | Status: DC | PRN
  Administered 2012-09-27 (×2): 80 ug via INTRAVENOUS

## 2012-09-27 MED ORDER — INSULIN ASPART 100 UNIT/ML ~~LOC~~ SOLN
SUBCUTANEOUS | Status: AC
  Administered 2012-09-27: 8 [IU]
  Filled 2012-09-27: qty 1

## 2012-09-27 MED ORDER — SODIUM CHLORIDE 0.9 % IV SOLN
3.0000 g | INTRAVENOUS | Status: AC
  Administered 2012-09-27: 3 g via INTRAVENOUS
  Filled 2012-09-27 (×2): qty 3

## 2012-09-27 MED ORDER — PROPOFOL 10 MG/ML IV BOLUS
INTRAVENOUS | Status: DC | PRN
  Administered 2012-09-27: 120 mg via INTRAVENOUS
  Administered 2012-09-27: 20 mg via INTRAVENOUS

## 2012-09-27 MED ORDER — BUPIVACAINE HCL (PF) 0.25 % IJ SOLN
INTRAMUSCULAR | Status: AC
  Filled 2012-09-27: qty 30

## 2012-09-27 MED ORDER — LIDOCAINE-EPINEPHRINE (PF) 1 %-1:200000 IJ SOLN
INTRAMUSCULAR | Status: AC
  Filled 2012-09-27: qty 10

## 2012-09-27 MED ORDER — NEOSTIGMINE METHYLSULFATE 1 MG/ML IJ SOLN
INTRAMUSCULAR | Status: DC | PRN
  Administered 2012-09-27: 3 mg via INTRAVENOUS

## 2012-09-27 MED ORDER — FENTANYL CITRATE 0.05 MG/ML IJ SOLN: 25.0000 ug | INTRAMUSCULAR | Status: DC | PRN

## 2012-09-27 MED ORDER — LIDOCAINE HCL (CARDIAC) 20 MG/ML IV SOLN
INTRAVENOUS | Status: DC | PRN
  Administered 2012-09-27: 50 mg via INTRAVENOUS

## 2012-09-27 MED ORDER — GLYCOPYRROLATE 0.2 MG/ML IJ SOLN
INTRAMUSCULAR | Status: DC | PRN
  Administered 2012-09-27: 0.6 mg via INTRAVENOUS
  Administered 2012-09-27: 0.2 mg via INTRAVENOUS

## 2012-09-27 MED ORDER — FENTANYL CITRATE 0.05 MG/ML IJ SOLN
INTRAMUSCULAR | Status: DC | PRN
  Administered 2012-09-27: 50 ug via INTRAVENOUS
  Administered 2012-09-27: 100 ug via INTRAVENOUS
  Administered 2012-09-27: 50 ug via INTRAVENOUS

## 2012-09-27 MED ORDER — 0.9 % SODIUM CHLORIDE (POUR BTL) OPTIME
TOPICAL | Status: DC | PRN
  Administered 2012-09-27: 1000 mL

## 2012-09-27 MED ORDER — SODIUM CHLORIDE 0.9 % IR SOLN
Status: DC | PRN
  Administered 2012-09-27: 1000 mL

## 2012-09-27 MED ORDER — ROCURONIUM BROMIDE 100 MG/10ML IV SOLN
INTRAVENOUS | Status: DC | PRN
  Administered 2012-09-27: 10 mg via INTRAVENOUS
  Administered 2012-09-27: 40 mg via INTRAVENOUS

## 2012-09-27 MED ORDER — EPHEDRINE SULFATE 50 MG/ML IJ SOLN
INTRAMUSCULAR | Status: DC | PRN
  Administered 2012-09-27 (×2): 10 mg via INTRAVENOUS

## 2012-09-27 MED ORDER — MORPHINE SULFATE 2 MG/ML IJ SOLN
2.0000 mg | INTRAMUSCULAR | Status: DC | PRN
  Administered 2012-09-27: 2 mg via INTRAVENOUS
  Filled 2012-09-27: qty 1

## 2012-09-27 MED ORDER — SODIUM CHLORIDE 0.9 % IV SOLN
3.0000 g | INTRAVENOUS | Status: DC | PRN
  Administered 2012-09-27: 3 g via INTRAVENOUS

## 2012-09-27 MED ORDER — SODIUM CHLORIDE 0.9 % IV SOLN
INTRAVENOUS | Status: DC | PRN
  Administered 2012-09-27: 09:00:00

## 2012-09-27 MED ORDER — ONDANSETRON HCL 4 MG/2ML IJ SOLN
INTRAMUSCULAR | Status: DC | PRN
  Administered 2012-09-27: 4 mg via INTRAVENOUS

## 2012-09-27 SURGICAL SUPPLY — 53 items
ADH SKN CLS APL DERMABOND .7 (GAUZE/BANDAGES/DRESSINGS) ×1
APPLIER CLIP ROT 10 11.4 M/L (STAPLE) ×2
APR CLP MED LRG 11.4X10 (STAPLE) ×1
BAG SPEC RTRVL LRG 6X4 10 (ENDOMECHANICALS) ×1
BLADE SURG ROTATE 9660 (MISCELLANEOUS) ×2 IMPLANT
CANISTER SUCTION 2500CC (MISCELLANEOUS) ×2 IMPLANT
CATH REDDICK CHOLANGI 4FR 50CM (CATHETERS) ×2 IMPLANT
CHLORAPREP W/TINT 26ML (MISCELLANEOUS) ×2 IMPLANT
CLIP APPLIE ROT 10 11.4 M/L (STAPLE) ×1 IMPLANT
CLOTH BEACON ORANGE TIMEOUT ST (SAFETY) ×2 IMPLANT
COVER SURGICAL LIGHT HANDLE (MISCELLANEOUS) ×2 IMPLANT
DECANTER SPIKE VIAL GLASS SM (MISCELLANEOUS) ×2 IMPLANT
DERMABOND ADVANCED (GAUZE/BANDAGES/DRESSINGS) ×1
DERMABOND ADVANCED .7 DNX12 (GAUZE/BANDAGES/DRESSINGS) ×1 IMPLANT
DRAPE C-ARM 42X72 X-RAY (DRAPES) ×2 IMPLANT
ELECT CAUTERY BLADE 6.4 (BLADE) ×2 IMPLANT
ELECT REM PT RETURN 9FT ADLT (ELECTROSURGICAL) ×2
ELECTRODE REM PT RTRN 9FT ADLT (ELECTROSURGICAL) ×1 IMPLANT
GLOVE BIO SURGEON STRL SZ 6.5 (GLOVE) ×1 IMPLANT
GLOVE BIO SURGEON STRL SZ7.5 (GLOVE) ×2 IMPLANT
GLOVE BIO SURGEON STRL SZ8 (GLOVE) ×1 IMPLANT
GLOVE BIOGEL PI IND STRL 7.0 (GLOVE) IMPLANT
GLOVE BIOGEL PI IND STRL 7.5 (GLOVE) IMPLANT
GLOVE BIOGEL PI IND STRL 8 (GLOVE) IMPLANT
GLOVE BIOGEL PI INDICATOR 7.0 (GLOVE) ×3
GLOVE BIOGEL PI INDICATOR 7.5 (GLOVE) ×2
GLOVE BIOGEL PI INDICATOR 8 (GLOVE) ×1
GLOVE ECLIPSE 7.5 STRL STRAW (GLOVE) ×2 IMPLANT
GLOVE SURG SS PI 7.0 STRL IVOR (GLOVE) ×3 IMPLANT
GLOVE SURG SS PI 7.5 STRL IVOR (GLOVE) ×4 IMPLANT
GOWN STRL NON-REIN LRG LVL3 (GOWN DISPOSABLE) ×9 IMPLANT
GOWN STRL REIN XL XLG (GOWN DISPOSABLE) ×3 IMPLANT
IV CATH 14GX2 1/4 (CATHETERS) ×2 IMPLANT
KIT BASIN OR (CUSTOM PROCEDURE TRAY) ×2 IMPLANT
KIT ROOM TURNOVER OR (KITS) ×2 IMPLANT
NS IRRIG 1000ML POUR BTL (IV SOLUTION) ×2 IMPLANT
PAD ARMBOARD 7.5X6 YLW CONV (MISCELLANEOUS) ×2 IMPLANT
PENCIL BUTTON HOLSTER BLD 10FT (ELECTRODE) ×2 IMPLANT
POUCH SPECIMEN RETRIEVAL 10MM (ENDOMECHANICALS) ×2 IMPLANT
SCISSORS LAP 5X35 DISP (ENDOMECHANICALS) IMPLANT
SET IRRIG TUBING LAPAROSCOPIC (IRRIGATION / IRRIGATOR) ×2 IMPLANT
SLEEVE ENDOPATH XCEL 5M (ENDOMECHANICALS) ×2 IMPLANT
SPECIMEN JAR SMALL (MISCELLANEOUS) ×2 IMPLANT
SUT MNCRL AB 4-0 PS2 18 (SUTURE) ×4 IMPLANT
SUT PROLENE 2 0 KS (SUTURE) ×1 IMPLANT
SUT VICRYL 0 UR6 27IN ABS (SUTURE) ×2 IMPLANT
TOWEL OR 17X24 6PK STRL BLUE (TOWEL DISPOSABLE) ×2 IMPLANT
TOWEL OR 17X26 10 PK STRL BLUE (TOWEL DISPOSABLE) ×2 IMPLANT
TRAY FOLEY CATH 14FR (SET/KITS/TRAYS/PACK) ×1 IMPLANT
TRAY LAPAROSCOPIC (CUSTOM PROCEDURE TRAY) ×2 IMPLANT
TROCAR BALLN 12MMX100 BLUNT (TROCAR) ×2 IMPLANT
TROCAR XCEL NON-BLD 11X100MML (ENDOMECHANICALS) ×2 IMPLANT
TROCAR XCEL NON-BLD 5MMX100MML (ENDOMECHANICALS) ×2 IMPLANT

## 2012-09-27 NOTE — Anesthesia Postprocedure Evaluation (Signed)
  Anesthesia Post-op Note  Patient: [name]  Procedure(s) Performed: Procedure(s): LAPAROSCOPIC CHOLECYSTECTOMY WITH INTRAOPERATIVE CHOLANGIOGRAM (N/A)  Patient Location: PACU  Anesthesia Type:General  Level of Consciousness: awake, alert  and oriented  Airway and Oxygen Therapy: Patient Spontanous Breathing and Patient connected to nasal cannula oxygen  Post-op Pain: mild  Post-op Assessment: Post-op Vital signs reviewed, Patient's Cardiovascular Status Stable, Respiratory Function Stable, Patent Airway and Pain level controlled  Post-op Vital Signs: stable  Complications: No apparent anesthesia complications

## 2012-09-27 NOTE — Anesthesia Procedure Notes (Signed)
Procedure Name: Intubation Date/Time: 09/27/2012 9:23 AM Performed by: Fransisca Kaufmann Pre-anesthesia Checklist: Patient identified, Timeout performed, Emergency Drugs available, Suction available and Patient being monitored Patient Re-evaluated:Patient Re-evaluated prior to inductionOxygen Delivery Method: Circle system utilized Preoxygenation: Pre-oxygenation with 100% oxygen Intubation Type: IV induction Ventilation: Mask ventilation without difficulty Laryngoscope Size: Mac and 4 Grade View: Grade II Tube type: Oral Tube size: 7.5 mm Number of attempts: 1 Airway Equipment and Method: Stylet Placement Confirmation: ETT inserted through vocal cords under direct vision,  positive ETCO2 and breath sounds checked- equal and bilateral Secured at: 23 cm Tube secured with: Tape Dental Injury: Teeth and Oropharynx as per pre-operative assessment

## 2012-09-27 NOTE — Progress Notes (Signed)
[facility] PROGRESS NOTE  [name] ZOX:[id number] DOB: June 29, 1932 DOA: 09/24/2012 PCP: Pcp Not In System  Narrative: [name] is a 79 y.o. male with a past medical history of Atrial fibrillation; Hypotension; HNP (herniated nucleus pulposus), lumbar; PVD (peripheral vascular disease); Diabetes mellitus; CAD (coronary artery disease); and Ventricular hypertrophy. Patient moved here from [place] since 2006 he has been non-compliant with his medications and his daughter is worried that he might be depressed. While visiting to [place] he had a suicidal attempt in 2013. He was diagnosed with situational depression but he refuses to take his medication. He has been crying out at night. Patient is straight about dying he is constantly feeling his pulse to see if his heart was still beating. Patient has been complaining about epigastric pain that has been there for the past 2-3 years but it seems to get worse recently. His lipase was slightly elevated to 125 and in emergency department US showed cholelithiasis but no biliary dilatation. No nausea or vomiting no liver function abnormalities. He reports some dizziness and sensation that he is unsteady on his feet this has been going on for few years but now it is worse. Today he stated that he feels short of breath and having chest pain and feeling like his is going to die. Patient is being vague with his symptoms. Family states he has been having a lot of anxiety and wants to take extra medication to help his symptoms. He's been trying to take extra Coumadin although only slightly. His daughter has taken over his medications because she is concerned he may be overmedicating himself. Patient has history of coronary artery disease and while visiting to [place] in 2004 have had a quadruple bypass. They currently do not have primary care provider but is scheduled to see [facility] cardiology in April.  Assessment/Plan:  Epigastric pain - likely  related to cholelithiasis.  - surgery consulted, plan for chole on 3/20 - cardiology consult for peri-op management.  Chest pain  - seems like this is more epigastric than true chest pain and it is related to foods.  - 2D echo done, read below - troponins negative x 3, underwent Lexiscan Myoview on 3/19 (full read below)  HYPERTENSION, BENIGN - patient does not take all his medications consistently was on diltiazem  - changed to Metoprolol here per cardiology - monitoring.   FIBRILLATION, ATRIAL - rate controlled on diltiazem continued digoxin. Digoxin level was not supratherapeutic.  - hold Coumadin for planned surgery. - need to restart anticoagulation when OK from surgical standpoint.   DIABETES MELLITUS -apparently is not taking anything for it but watch blood sugars and put on sliding scale.   Sinusitis - evidence of acute sinusitis we'll cover with Augmentin   Unsteady gait - This is not an acute change and has been going on for years is no evidence of recent CVA but a history of past CVAs evident on the CT scan. We'll make sure he is on aspirin continue Coumadin. Order PT OT evaluation to further evaluate his symptoms and needs at home.  - PT recommended home health PT on discharge.   Possible depression - would recommend either psychiatry followup as an outpatient or inpatient consult. For now will initiate on Cymbalta and monitor. TSH normal. vitamin B 12 normal. Folate normal. - mood good in the hospital, patient is interactive and smiling - needs outpatient follow up likely.   Code Status: Full Family Communication: none today (but [name] can be reached at  [phone])  Disposition Plan: surgery 3/20  Consultants:  Surgery  Cardiology  Procedures:  Myoview - read below  2D echo Study Conclusions - Left ventricle: The cavity size was normal. Wall thickness was increased in a pattern of mild LVH. Systolic function was normal. The estimated ejection fraction was  in the range of 50% to 55%. Wall motion was normal; there were no regional wall motion abnormalities. - Left atrium: The atrium was moderately to severely dilated. - Right atrium: The atrium was moderately dilated. - Tricuspid valve: Moderate regurgitation. - Pulmonary arteries: PA peak pressure: 43mm Hg (S).  Antibiotics:  Augmentin 3/17 >>  HPI/Subjective: - denies pain today.   Objective: Filed Vitals:   09/26/12 1653 09/26/12 1845 09/26/12 2106 09/27/12 0330  BP: 181/94 161/67 169/79 168/92  Pulse: 122 73 76 72  Temp:   97.9 F (36.6 C) 97.6 F (36.4 C)  TempSrc:   Oral Oral  Resp:   18 18  Height:      Weight:    66.5 kg (146 lb 9.7 oz)  SpO2:   97% 98%    Intake/Output Summary (Last 24 hours) at 09/27/12 0830 Last data filed at 09/27/12 0600  Gross per 24 hour  Intake    720 ml  Output      0 ml  Net    720 ml   Filed Weights   09/25/12 0132 09/26/12 0616 09/27/12 0330  Weight: 68.4 kg (150 lb 12.7 oz) 66.7 kg (147 lb 0.8 oz) 66.5 kg (146 lb 9.7 oz)   Exam:  General:  NAD  Cardiovascular: iregular, without MRG  Respiratory: good air movement, clear to auscultation throughout, no wheezing, ronchi or rales  Abdomen: soft, mildly tender to palpation in the epigastric area, positive bowel sounds  MSK: no peripheral edema  Neuro: CN 2-12 grossly intact, MS 5/5 in all 4  Data Reviewed: Basic Metabolic Panel:  Recent Labs Lab 09/24/12 1920 09/25/12 0656 09/26/12 0545  NA 134* 137 133*  K 4.4 4.3 4.3  CL 99 101 99  CO2 24 27 23   GLUCOSE 216* 207* 217*  BUN 32* 25* 20  CREATININE 1.25 1.07 1.05  CALCIUM 9.3 9.2 9.1  MG  --  2.0  --   PHOS  --  3.0  --    Liver Function Tests:  Recent Labs Lab 09/24/12 1920 09/25/12 0656  AST 22 19  ALT 31 26  ALKPHOS 52 45  BILITOT 0.4 0.8  PROT 7.7 7.5  ALBUMIN 3.3* 3.2*    Recent Labs Lab 09/24/12 1920 09/25/12 0656  LIPASE 124* 110*   CBC:  Recent Labs Lab 09/24/12 1920 09/25/12 0656  09/26/12 0545  WBC 7.2 6.4 6.2  NEUTROABS 4.6  --   --   HGB 13.7 13.7 13.7  HCT 40.0 41.1 40.2  MCV 85.8 85.8 86.1  PLT 252 246 223   Cardiac Enzymes:  Recent Labs Lab 09/24/12 1921 09/25/12 0121 09/25/12 0656 09/25/12 1309  TROPONINI <0.30 <0.30 <0.30 <0.30   CBG:  Recent Labs Lab 09/26/12 0614 09/26/12 1315 09/26/12 1616 09/26/12 2103 09/27/12 0603  GLUCAP 202* 271* 200* 161* 213*   Studies: Nm Myocar Multi W/spect W/wall Motion / Ef  09/26/2012  *RADIOLOGY REPORT*  Clinical Data:  The patient has known coronary disease.  There has been some chest pain.  The study is done for further evaluation.  MYOCARDIAL IMAGING WITH SPECT (REST AND PHARMACOLOGIC-STRESS) GATED LEFT VENTRICULAR WALL MOTION STUDY LEFT VENTRICULAR EJECTION FRACTION  Technique:  Standard myocardial SPECT imaging was performed after resting intravenous injection of  mCi Tc-28m . Subsequently, intravenous infusion of  was performed under the supervision of the Cardiology staff.  At peak effect of the drug,  mCi Tc-88m  was injected intravenously and standard myocardial SPECT  imaging was performed.  Quantitative gated imaging was also performed to evaluate left ventricular wall motion, and estimate left ventricular ejection fraction.  FINDINGS:  The patient was stressed with Lexiscan. He tolerated the infusion without major complaints.  The raw images revealed no evidence of excess motion.  Tomographic images reveal a question of decreased counts in the inferolateral wall.  I believe this is related to the shape of his ventricle.  It is possible there could be some scar in this area.  The stress image reveals moderate decreased activity at the base of the inferolateral wall.  The rest image reveals moderate decreased activity at the base of the inferolateral wall.  There is no reversibility.  Wall motion is completely normal.  The ejection fraction is 55%.  IMPRESSION: This study shows no definite ischemia.  There is  normal wall motion.  There is question of  scar at the base of the inferolateral wall.  It is possible that this finding is related to the shape of the ventricle.  Overall this is a low risk scan.   Original Report Authenticated By: Willa Rough, M.D.    Scheduled Meds: . [MAR HOLD] ampicillin-sulbactam (UNASYN) IV  3 g Intravenous On Call  . [facility] HOLD] aspirin EC  81 mg Oral Daily  . [facility] HOLD] atorvastatin  20 mg Oral q1800  . [facility] HOLD] digoxin  0.125 mg Oral Daily  . [facility] HOLD] docusate sodium  100 mg Oral BID  . [facility] HOLD] DULoxetine  20 mg Oral Daily  . [MAR HOLD] enalapril  2.5 mg Oral Daily  . [MAR HOLD] fluticasone  1 spray Each Nare BID  . [MAR HOLD] insulin aspart  0-5 Units Subcutaneous QHS  . [MAR HOLD] insulin aspart  0-9 Units Subcutaneous TID WC  . [facility] HOLD] metoprolol succinate  50 mg Oral BID  . [MAR HOLD] pantoprazole  40 mg Oral Q1200  . [MAR HOLD] senna  1 tablet Oral BID  . [MAR HOLD] sodium chloride  3 mL Intravenous Q12H  . [facility] HOLD] sodium chloride  3 mL Intravenous Q12H   Continuous Infusions:    Active Problems:   DIABETES MELLITUS   HYPERTENSION, BENIGN   FIBRILLATION, ATRIAL   Chest pain   Cholelithiases   Sinusitis   Unsteady gait   CAD (coronary artery disease)   Hx of CABG   Pamella Pert, MD [facility] Pager [phone]. If 7 PM - 7 AM, please contact night-coverage at [email or web address], password [facility] 09/27/2012, 8:30 AM  LOS: 3 days

## 2012-09-27 NOTE — Preoperative (Signed)
Beta Blockers   Reason not to administer Beta Blockers:Not Applicable 

## 2012-09-27 NOTE — Progress Notes (Signed)
Pt had a one time pause of 2.06 sec at rest and his HR dropped to 40s nonsustained  this shift.. RN went into the room and noted pt resting in bed with eyes closed. No indications of distress noted. MD on-call notified and no order received. Will cont to monitor.

## 2012-09-27 NOTE — Progress Notes (Signed)
Day of Surgery  Subjective: No new complaints  Objective: Vital signs in last 24 hours: Temp:  [97.6 F (36.4 C)-98 F (36.7 C)] 97.6 F (36.4 C) (03/20 0330) Pulse Rate:  [72-122] 72 (03/20 0330) Resp:  [17-18] 18 (03/20 0330) BP: (106-181)/(56-97) 168/92 mmHg (03/20 0330) SpO2:  [97 %-99 %] 98 % (03/20 0330) Weight:  [146 lb 9.7 oz (66.5 kg)] 146 lb 9.7 oz (66.5 kg) (03/20 0330) Last BM Date: 09/24/12  Intake/Output from previous day: 03/19 0701 - 03/20 0700 In: 720 [P.O.:720] Out: -  Intake/Output this shift:    General appearance: alert, cooperative and no distress Resp: clear to auscultation bilaterally Cardio: normal rate GI: soft, non-tender; bowel sounds normal; no masses,  no organomegaly  Lab Results:  @LABLAST2 (wbc:2,hgb:2,hct:2,plt:2) BMET  Recent Labs  09/25/12 0656 09/26/12 0545  NA 137 133*  K 4.3 4.3  CL 101 99  CO2 27 23  GLUCOSE 207* 217*  BUN 25* 20  CREATININE 1.07 1.05  CALCIUM 9.2 9.1   PT/INR  Recent Labs  09/26/12 0545 09/27/12 0510  LABPROT 15.8* 14.5  INR 1.29 1.15   ABG No results found for this basename: PHART, PCO2, PO2, HCO3,  in the last 72 hours  Studies/Results: Nm Myocar Multi W/spect W/wall Motion / Ef  09/26/2012  *RADIOLOGY REPORT*  Clinical Data:  The patient has known coronary disease.  There has been some chest pain.  The study is done for further evaluation.  MYOCARDIAL IMAGING WITH SPECT (REST AND PHARMACOLOGIC-STRESS) GATED LEFT VENTRICULAR WALL MOTION STUDY LEFT VENTRICULAR EJECTION FRACTION  Technique:  Standard myocardial SPECT imaging was performed after resting intravenous injection of  mCi Tc-6m . Subsequently, intravenous infusion of  was performed under the supervision of the Cardiology staff.  At peak effect of the drug,  mCi Tc-6m  was injected intravenously and standard myocardial SPECT  imaging was performed.  Quantitative gated imaging was also performed to evaluate left ventricular wall motion, and  estimate left ventricular ejection fraction.  FINDINGS:  The patient was stressed with Lexiscan. He tolerated the infusion without major complaints.  The raw images revealed no evidence of excess motion.  Tomographic images reveal a question of decreased counts in the inferolateral wall.  I believe this is related to the shape of his ventricle.  It is possible there could be some scar in this area.  The stress image reveals moderate decreased activity at the base of the inferolateral wall.  The rest image reveals moderate decreased activity at the base of the inferolateral wall.  There is no reversibility.  Wall motion is completely normal.  The ejection fraction is 55%.  IMPRESSION: This study shows no definite ischemia.  There is normal wall motion.  There is question of  scar at the base of the inferolateral wall.  It is possible that this finding is related to the shape of the ventricle.  Overall this is a low risk scan.   Original Report Authenticated By: Willa Rough, M.D.     Anti-infectives: Anti-infectives   Start     Dose/Rate Route Frequency Ordered Stop   09/27/12 0730  [MAR Hold]  Ampicillin-Sulbactam (UNASYN) 3 g in sodium chloride 0.9 % 100 mL IVPB     (On MAR Hold since 09/27/12 0819)  Comments:  On call to OR   3 g 100 mL/hr over 60 Minutes Intravenous On call 09/27/12 0725 09/28/12 0730   09/25/12 0130  amoxicillin-clavulanate (AUGMENTIN) 875-125 MG per tablet 1 tablet  Status:  Discontinued     1 tablet Oral Every 12 hours 09/25/12 0116 09/27/12 0725      Assessment/Plan: s/p Procedure(s): LAPAROSCOPIC CHOLECYSTECTOMY WITH INTRAOPERATIVE CHOLANGIOGRAM I spoke with him through the language line.  He denies any changes.  I again answered any questions.  we will proceed with lap cholecystectomy/ IOC/ possible open.   LOS: 3 days    Lodema Pilot DAVID 09/27/2012

## 2012-09-27 NOTE — Anesthesia Preprocedure Evaluation (Addendum)
Anesthesia Evaluation  Patient identified by MRN, date of birth, ID band Patient awake    Reviewed: Allergy & Precautions, H&P , NPO status , Patient's Chart, lab work & pertinent test results, reviewed documented beta blocker date and time   Airway Mallampati: II TM Distance: >3 FB Neck ROM: Full    Dental no notable dental hx. (+) Edentulous Upper and Dental Advisory Given   Pulmonary neg pulmonary ROS,  breath sounds clear to auscultation  Pulmonary exam normal       Cardiovascular hypertension, On Medications and Pt. on home beta blockers + CAD, + Past MI, + CABG and + Peripheral Vascular Disease negative cardio ROS  + dysrhythmias Atrial Fibrillation Rhythm:Irregular Rate:Normal     Neuro/Psych negative neurological ROS  negative psych ROS   GI/Hepatic negative GI ROS, Neg liver ROS,   Endo/Other  negative endocrine ROSdiabetes, Type 2, Oral Hypoglycemic Agents  Renal/GU negative Renal ROS  negative genitourinary   Musculoskeletal   Abdominal   Peds  Hematology negative hematology ROS (+)   Anesthesia Other Findings   Reproductive/Obstetrics negative OB ROS                          Anesthesia Physical Anesthesia Plan  ASA: III  Anesthesia Plan: General   Post-op Pain Management:    Induction: Intravenous  Airway Management Planned: Oral ETT  Additional Equipment:   Intra-op Plan:   Post-operative Plan: Extubation in OR  Informed Consent: I have reviewed the patients History and Physical, chart, labs and discussed the procedure including the risks, benefits and alternatives for the proposed anesthesia with the patient or authorized representative who has indicated his/her understanding and acceptance.   Dental advisory given  Plan Discussed with: CRNA  Anesthesia Plan Comments:         Anesthesia Quick Evaluation

## 2012-09-27 NOTE — Transfer of Care (Signed)
Immediate Anesthesia Transfer of Care Note  Patient: [name]  Procedure(s) Performed: Procedure(s): LAPAROSCOPIC CHOLECYSTECTOMY WITH INTRAOPERATIVE CHOLANGIOGRAM (N/A)  Patient Location: PACU  Anesthesia Type:General  Level of Consciousness: awake, alert , oriented and sedated  Airway & Oxygen Therapy: Patient Spontanous Breathing, Patient connected to nasal cannula oxygen and Patient connected to face mask oxygen  Post-op Assessment: Report given to PACU RN, Post -op Vital signs reviewed and stable and Patient moving all extremities  Post vital signs: Reviewed and stable  Complications: No apparent anesthesia complications

## 2012-09-27 NOTE — Progress Notes (Signed)
OT Cancellation Note  Patient Details Name: [name] MRN: [id number] DOB: 08-09-1931   Cancelled Treatment:    Reason Eval/Treat Not Completed: Other (comment) (in surgery). Will attempt next date as appropriate.    09/27/2012 [name] OTR/L Pager [phone] Office [phone]

## 2012-09-27 NOTE — Progress Notes (Signed)
PT Cancellation Note  Patient Details Name: [name] MRN: [id number] DOB: [birth year]   Cancelled Treatment:    Reason Eval/Treat Not Completed: Medical issues which prohibited therapy.  Pt gone for surgery.  Will see as able 3/21. 09/27/2012  [place] Bing, PT [phone] (pager)   Corynne Scibilia, Eliseo Gum 09/27/2012, 9:15 AM

## 2012-09-27 NOTE — Care Management Note (Unsigned)
    Page 1 of 2   09/28/2012     2:51:41 PM   CARE MANAGEMENT NOTE 09/28/2012  Patient:  [name], [name]   Account Number:  [id number]  Date Initiated:  09/25/2012  Documentation initiated by:  [facility]  Subjective/Objective Assessment:   Admitted with CP, SOB, HTN -  Lives with daughter.  Speaks no english     Action/Plan:   MET WITH DAUGHTER TO DISCUSS DC PLANS.   Anticipated DC Date:  09/29/2012   Anticipated DC Plan:  HOME/SELF CARE      DC Planning Services  CM consult      Choice offered to / List presented to:             Status of service:  In process, will continue to follow Medicare Important Message given?   (If response is "NO", the following Medicare IM given date fields will be blank) Date Medicare IM given:   Date Additional Medicare IM given:    Discharge Disposition:    Per UR Regulation:  Reviewed for med. necessity/level of care/duration of stay  If discussed at Long Length of Stay Meetings, dates discussed:    Comments:  ContactBradford, Villarreal Daughter [phone]  09/28/12 Glenn Kenton,RN,BSN [phone] MET WITH DAUGHTER [name] TO DISCUSS DC PLANS.  SHE IS CONCERNED, AS SHE HAS NO ONE TO STAY WITH PT WHILE SHE WORKS, AND PT WILL BE ALONE MOST OF THE DAY AT DC.  P.T. EVAL HAS BEEN COMPLETED, AND RECOMMENDATION IS FOR HHPT, HOWEVER, PT HAS MEDICAID, AND HOME P.T. WILL NOT BE COVERED.   WE DISCUSSED POSSIBILITY OF SNF, BUT PT NOT ELIGIBLE FOR THIS, AS HE HAS NO SKILLED NEED. RECOMMENDED POSSIBLE APPLICATION FOR CAPS OR PCS SERVICE THROUGH MEDICAID TO DAUGHTER, BUT UNCERTAIN AGAIN THAT PT IS ELIGIBLE FOR THIS.  THERE IS NO OTHER FAMILY AVAILABLE. DAUGHTER STATES SHE CAN PICK UP PT ON SAT AFTER WORK AND SHE WILL BE OFF ALL DAY SUNDAY.  PT HAS NO PCP...MADE APPT WITH [facility] FOR 3/28 AT 12:30 AND INFO PUT ON DC INSTRUCTIONS.  09/27/12 Glenn Pinkard,RN,BSN [phone] PT IS FROM [place], SPEAKS NO ENGLISH.  PER CSW INTERPRETING SERVICES:  NO  FACE TO FACE INTERPRETER FOR THIS LANGUAGE. WILL NEED TO USE PACIFIC INTERPRETER NUMBER OR FAMILY MEMBERS AS INTERPRETERS.  PT S/P LAP CHOLECYSTECTOMY TODAY.

## 2012-09-27 NOTE — Brief Op Note (Signed)
09/24/2012 - 09/27/2012  11:37 AM  PATIENT:  [name]  79 y.o. male  PRE-OPERATIVE DIAGNOSIS:  CHOLECYSTITIS  POST-OPERATIVE DIAGNOSIS:  CHOLECYSTITIS  PROCEDURE:  Procedure(s): LAPAROSCOPIC CHOLECYSTECTOMY WITH INTRAOPERATIVE CHOLANGIOGRAM (N/A)  SURGEON:  Surgeon(s) and Role:    * Lodema Pilot, DO - Primary  PHYSICIAN ASSISTANT:   ASSISTANTS: [place]  ANESTHESIA:   general  EBL:  Total I/O In: 1800 [I.V.:1800] Out: -   BLOOD ADMINISTERED:none  DRAINS: none   LOCAL MEDICATIONS USED:  MARCAINE    and LIDOCAINE   SPECIMEN:  Source of Specimen:  gallbladder  DISPOSITION OF SPECIMEN:  PATHOLOGY  COUNTS:  YES  TOURNIQUET:  * No tourniquets in log *  DICTATION: .Other Dictation: Dictation Number [id number]  PLAN OF CARE: Admit to inpatient   PATIENT DISPOSITION:  PACU - hemodynamically stable.   Delay start of Pharmacological VTE agent (>24hrs) due to surgical blood loss or risk of bleeding: no

## 2012-09-28 ENCOUNTER — Encounter (HOSPITAL_COMMUNITY): Payer: Self-pay | Admitting: General Surgery

## 2012-09-28 DIAGNOSIS — E119 Type 2 diabetes mellitus without complications: Secondary | ICD-10-CM

## 2012-09-28 LAB — CBC
HCT: 36.3 % — ABNORMAL LOW (ref 39.0–52.0)
Hemoglobin: 12.1 g/dL — ABNORMAL LOW (ref 13.0–17.0)
MCV: 87.3 fL (ref 78.0–100.0)
RBC: 4.16 MIL/uL — ABNORMAL LOW (ref 4.22–5.81)
RDW: 14.3 % (ref 11.5–15.5)
WBC: 7.5 10*3/uL (ref 4.0–10.5)

## 2012-09-28 LAB — BASIC METABOLIC PANEL
BUN: 21 mg/dL (ref 6–23)
CO2: 25 mEq/L (ref 19–32)
Chloride: 100 mEq/L (ref 96–112)
Creatinine, Ser: 1.22 mg/dL (ref 0.50–1.35)
GFR calc Af Amer: 64 mL/min — ABNORMAL LOW (ref >90)
Potassium: 4.1 mEq/L (ref 3.5–5.1)

## 2012-09-28 LAB — GLUCOSE, CAPILLARY
Glucose-Capillary: 236 mg/dL — ABNORMAL HIGH (ref 70–99)
Glucose-Capillary: 201 mg/dL — ABNORMAL HIGH (ref 70–99)
Glucose-Capillary: 196 mg/dL — ABNORMAL HIGH (ref 70–99)

## 2012-09-28 LAB — PROTIME-INR: INR: 1.18 (ref 0.00–1.49)

## 2012-09-28 LAB — MAGNESIUM: Magnesium: 1.7 mg/dL (ref 1.5–2.5)

## 2012-09-28 MED ORDER — WARFARIN SODIUM 5 MG PO TABS
5.0000 mg | ORAL_TABLET | Freq: Once | ORAL | Status: AC
  Administered 2012-09-28: 5 mg via ORAL
  Filled 2012-09-28: qty 1

## 2012-09-28 MED ORDER — MAGNESIUM SULFATE IN D5W 10-5 MG/ML-% IV SOLN
1.0000 g | Freq: Once | INTRAVENOUS | Status: AC
  Administered 2012-09-28: 1 g via INTRAVENOUS
  Filled 2012-09-28: qty 100

## 2012-09-28 MED ORDER — ALPRAZOLAM 0.5 MG PO TABS
0.5000 mg | ORAL_TABLET | Freq: Two times a day (BID) | ORAL | Status: DC | PRN
  Administered 2012-09-28: 0.5 mg via ORAL
  Filled 2012-09-28: qty 1

## 2012-09-28 MED ORDER — GLYBURIDE 5 MG PO TABS
5.0000 mg | ORAL_TABLET | Freq: Every day | ORAL | Status: DC
  Administered 2012-09-29: 5 mg via ORAL
  Filled 2012-09-28 (×3): qty 1

## 2012-09-28 MED ORDER — ENALAPRIL MALEATE 5 MG PO TABS
5.0000 mg | ORAL_TABLET | Freq: Every day | ORAL | Status: DC
  Administered 2012-09-29: 5 mg via ORAL
  Filled 2012-09-28: qty 1

## 2012-09-28 MED ORDER — WARFARIN - PHARMACIST DOSING INPATIENT: Freq: Every day | Status: DC

## 2012-09-28 NOTE — Progress Notes (Addendum)
Physical Therapy Treatment Patient Details Name: [name] MRN: [id number] DOB: 1931/08/09 Today's Date: 09/28/2012 Time: 4098-1191 PT Time Calculation (min): 17 min  PT Assessment / Plan / Recommendation Comments on Treatment Session  Communication difficult with this patient.  Called and utilized daughter to make sure pt's needs met.  He is painful, but participative.  Gait unsteady without assistive device.  Pt still viually weak and pt's daughter states that pt is concerned he is so weak.    Follow Up Recommendations  Home health PT     Does the patient have the potential to tolerate intense rehabilitation     Barriers to Discharge        Equipment Recommendations  None recommended by PT    Recommendations for Other Services    Frequency Min 3X/week   Plan Discharge plan remains appropriate;Frequency remains appropriate    Precautions / Restrictions Precautions Precautions: None Restrictions Weight Bearing Restrictions: No   Pertinent Vitals/Pain EHR 100's  sats on RA 96%    Mobility  Bed Mobility Bed Mobility: Supine to Sit;Sitting - Scoot to Edge of Bed;Sit to Supine Supine to Sit: 6: Modified independent (Device/Increase time) Sitting - Scoot to Edge of Bed: 6: Modified independent (Device/Increase time) Sit to Supine: 6: Modified independent (Device/Increase time) Details for Bed Mobility Assistance: safe mobility Transfers Transfers: Sit to Stand;Stand to Sit Sit to Stand: 5: Supervision;From bed;With upper extremity assist Stand to Sit: 5: Supervision;To chair/3-in-1;With upper extremity assist Details for Transfer Assistance: generally safe Ambulation/Gait Ambulation/Gait Assistance: 4: Min guard;5: Supervision Ambulation Distance (Feet): 220 Feet Assistive device: Rolling walker;None Ambulation/Gait Assistance Details: min guard without assist device, Super with RW; more steady with RW, but choose a very slow speed.  As long as cued to go faster, he  will and without forward momentum cuing he will slow down. Gait Pattern: Step-through pattern Gait velocity: slower Stairs: No Wheelchair Mobility Wheelchair Mobility: No    Exercises     PT Diagnosis:    PT Problem List:   PT Treatment Interventions:     PT Goals Acute Rehab PT Goals PT Goal Formulation: With patient Time For Goal Achievement: 10/09/12 Potential to Achieve Goals: Good Pt will go Supine/Side to Sit: with modified independence;with HOB 0 degrees PT Goal: Supine/Side to Sit - Progress: Met Pt will go Sit to Stand: with modified independence PT Goal: Sit to Stand - Progress: Progressing toward goal Pt will Transfer Bed to Chair/Chair to Bed: with modified independence PT Transfer Goal: Bed to Chair/Chair to Bed - Progress: Progressing toward goal Pt will Ambulate: >150 feet;with modified independence;with least restrictive assistive device PT Goal: Ambulate - Progress: Progressing toward goal  Visit Information  Assistance Needed: +1    Subjective Data  Subjective: pt rubbing his stomach and speaking [place]ian.  Dtr called and relates as expected that he is in pain and asks for a little bit of medicine.   Cognition  Cognition Overall Cognitive Status: Appears within functional limits for tasks assessed/performed Arousal/Alertness: Awake/alert Orientation Level: Appears intact for tasks assessed Behavior During Session: [place] for tasks performed    Balance  Dynamic Standing Balance Dynamic Standing - Balance Support: Bilateral upper extremity supported;During functional activity Dynamic Standing - Level of Assistance: 5: Stand by assistance  End of Session PT - End of Session Activity Tolerance: Patient tolerated treatment well Patient left: in bed;with call bell/phone within reach;with family/visitor present Nurse Communication: Mobility status   GP     Reighlyn Elmes, Eliseo Gum 09/28/2012, 1:03 PM  09/28/2012  Garrison Bing, PT [phone]  (pager)

## 2012-09-28 NOTE — Progress Notes (Signed)
Patient ID: [name], male   DOB: April 22, 1932, 79 y.o.   MRN: [id number] 1 Day Post-Op  Subjective: Pt c/o epigastric pain, but controlled with pain meds.  Ambulating and tolerating a solid diet  Objective: Vital signs in last 24 hours: Temp:  [97.1 F (36.2 C)-98.4 F (36.9 C)] 98.4 F (36.9 C) (03/21 0619) Pulse Rate:  [67-106] 106 (03/21 0619) Resp:  [12-18] 18 (03/21 0619) BP: (162-195)/(71-109) 162/82 mmHg (03/21 0700) SpO2:  [96 %-100 %] 99 % (03/21 0619) FiO2 (%):  [2 %] 2 % (03/20 1923) Last BM Date: 09/24/12  Intake/Output from previous day: 03/20 0701 - 03/21 0700 In: 1900 [I.V.:1900] Out: 475 [Urine:475] Intake/Output this shift:    PE: Abd: soft, appropriately tender, +BS, ND, incisions c/d/i  Lab Results:   Recent Labs  09/26/12 0545 09/28/12 0530  WBC 6.2 7.5  HGB 13.7 12.1*  HCT 40.2 36.3*  PLT 223 184   BMET  Recent Labs  09/26/12 0545 09/28/12 0530  NA 133* 136  K 4.3 4.1  CL 99 100  CO2 23 25  GLUCOSE 217* 192*  BUN 20 21  CREATININE 1.05 1.22  CALCIUM 9.1 9.0   PT/INR  Recent Labs  09/27/12 0510 09/28/12 0530  LABPROT 14.5 14.8  INR 1.15 1.18   CMP     Component Value Date/Time   NA 136 09/28/2012 0530   K 4.1 09/28/2012 0530   CL 100 09/28/2012 0530   CO2 25 09/28/2012 0530   GLUCOSE 192* 09/28/2012 0530   BUN 21 09/28/2012 0530   CREATININE 1.22 09/28/2012 0530   CALCIUM 9.0 09/28/2012 0530   PROT 7.5 09/25/2012 0656   ALBUMIN 3.2* 09/25/2012 0656   AST 19 09/25/2012 0656   ALT 26 09/25/2012 0656   ALKPHOS 45 09/25/2012 0656   BILITOT 0.8 09/25/2012 0656   GFRNONAA 55* 09/28/2012 0530   GFRAA 64* 09/28/2012 0530   Lipase     Component Value Date/Time   LIPASE 110* 09/25/2012 0656       Studies/Results: Dg Cholangiogram Operative  09/27/2012  *RADIOLOGY REPORT*  Clinical Data:   Cholecystitis  INTRAOPERATIVE CHOLANGIOGRAM  Technique:  Cholangiographic images from the C-arm fluoroscopic device were submitted for  interpretation post-operatively.  Please see the procedural report for the amount of contrast and the fluoroscopy time utilized.  Comparison:  None.  Findings:  Contrast fills the biliary tree and duodenum.  There is some reflux of contrast into the pancreatic duct.  There are no filling defects in the common bile duct to suggest common duct stones.  IMPRESSION: Patent biliary tree without evidence of common bile duct stones.   Original Report Authenticated By: Jolaine Click, M.D.    Nm Myocar Multi W/spect W/wall Motion / Ef  09/26/2012  *RADIOLOGY REPORT*  Clinical Data:  The patient has known coronary disease.  There has been some chest pain.  The study is done for further evaluation.  MYOCARDIAL IMAGING WITH SPECT (REST AND PHARMACOLOGIC-STRESS) GATED LEFT VENTRICULAR WALL MOTION STUDY LEFT VENTRICULAR EJECTION FRACTION  Technique:  Standard myocardial SPECT imaging was performed after resting intravenous injection of  mCi Tc-16m . Subsequently, intravenous infusion of  was performed under the supervision of the Cardiology staff.  At peak effect of the drug,  mCi Tc-56m  was injected intravenously and standard myocardial SPECT  imaging was performed.  Quantitative gated imaging was also performed to evaluate left ventricular wall motion, and estimate left ventricular ejection fraction.  FINDINGS:  The patient was stressed  with Lexiscan. He tolerated the infusion without major complaints.  The raw images revealed no evidence of excess motion.  Tomographic images reveal a question of decreased counts in the inferolateral wall.  I believe this is related to the shape of his ventricle.  It is possible there could be some scar in this area.  The stress image reveals moderate decreased activity at the base of the inferolateral wall.  The rest image reveals moderate decreased activity at the base of the inferolateral wall.  There is no reversibility.  Wall motion is completely normal.  The ejection fraction is 55%.   IMPRESSION: This study shows no definite ischemia.  There is normal wall motion.  There is question of  scar at the base of the inferolateral wall.  It is possible that this finding is related to the shape of the ventricle.  Overall this is a low risk scan.   Original Report Authenticated By: Willa Rough, M.D.     Anti-infectives: Anti-infectives   Start     Dose/Rate Route Frequency Ordered Stop   09/27/12 0730  [MAR Hold]  Ampicillin-Sulbactam (UNASYN) 3 g in sodium chloride 0.9 % 100 mL IVPB     (On MAR Hold since 09/27/12 0819)  Comments:  On call to OR   3 g 100 mL/hr over 60 Minutes Intravenous On call 09/27/12 0725 09/27/12 0926   09/25/12 0130  amoxicillin-clavulanate (AUGMENTIN) 875-125 MG per tablet 1 tablet  Status:  Discontinued     1 tablet Oral Every 12 hours 09/25/12 0116 09/27/12 0725       Assessment/Plan  1. S/p lap chole for chronic cholecystitis  Plan: 1. Pt doing well post op.  He is stable for dc home when medically ready.   LOS: 4 days    OSBORNE,KELLY E 09/28/2012, 9:25 AM Pager: [phone]  Some mild tachycardia postop but he looks okay.  Abdomen is appropriately tender and nondistended.  Wounds okay.  Difficult with language barrier but I do not see any sign of postop complication.  Would make sure that he is getting the pain meds and monitor tachycardia.

## 2012-09-28 NOTE — Progress Notes (Signed)
[facility] PROGRESS NOTE  Dusten Ellinwood ZOX:[id number] DOB: [birth year] DOA: 09/24/2012 PCP: Pcp Not In System   Brief narrative  79 y.o. male with a past medical history of Atrial fibrillation; Hypotension; HNP (herniated nucleus pulposus), lumbar; PVD (peripheral vascular disease); Diabetes mellitus; CAD (coronary artery disease); and Ventricular hypertrophy. Patient moved here from [place] since 2006 he has been non-compliant with his medications and his daughter is worried that he might be depressed. While visiting to [place] he had a suicidal attempt in 2013. He was diagnosed with situational depression but he refuses to take his medication. He has been crying out at night. Patient is straight about dying he is constantly feeling his pulse to see if his heart was still beating. Patient has been complaining about epigastric pain that has been there for the past 2-3 years but it seems to get worse recently. His lipase was slightly elevated to 125 and in emergency department US showed cholelithiasis but no biliary dilatation. No nausea or vomiting no liver function abnormalities. He reports some dizziness and sensation that he is unsteady on his feet this has been going on for few years but now it is worse. He presented to the ED with shortness of breath and chest discomfort and has been worried that he will die. His daughter has taken over his medications because she is concerned he may be overmedicating himself. Patient has history of coronary artery disease and while visiting to [place] in 2004 have had a quadruple bypass.  Patient admitted to medical floor and ultrasound of the abdomen showed cholelithiasis.  Assessment/Plan:   Epigastric pain with cholelithiasis - surgery consulted she had laparoscopic cholecystectomy done on 3/20. Tolerate a procedure well. Has been tolerating advanced diet this morning although complains of some epigastric pain. Continue pain control. - cardiology  consult for peri-op management. Underwent nuclear stress test which was negative for ischemia.   Chest pain  -Appear to be epigastric. 2-D echo unremarkable. negative nuclear stress test  Atrial fibrillation Cardizem and placed by metoprolol in the hospital. Patient does get tachycardia on ambulation. I have increased the dose of metoprolol. Continue digoxin. I have resumed his Coumadin today. He [facility]up [facility] on April 1 and will followup with INR checks that.  Hypertension Medication noncompliance. Change to Cardizem to metoprolol. Given elevated heart rate I have increased the dose of metoprolol today      diabetes mellitus Patient apparently has not been taking any medications. Hemoglobin A1c is 9. I will start him on metformin and glipizide. Needs to set up a PCP as outpatient and I will ask case manager to provide resources for  PCP in the community     Unsteady gait -  [place]oying for possible years. no evidence of recent CVA but a history of past CVAs evident on the CT scan. Continue low dose aspirin and Coumadin.  - PT recommended home health PT on discharge.    ?depression TSH normal patient started on Cymbalta. Denies any depressive symptoms or suicidal ideations. Does seem to be quite anxious at times. Will Start him on a low dose of Xanax. Would benefit from outpatient psych evaluation.    Acute sinusitis Continue Augmentin    Code Status: full code Family Communication: spoke with daughter over the phone Disposition Plan: Home likely tomorrow if stable   Consultants:  [facility]  surgery  Procedures:  Exercise stress test   lap choly on 3/20  Antibiotics:  Augmentin  HPI/Subjective: Patient seen and examined with the help of interpreter. Informs of some epigastric pain. Denies any depressive symptoms  Objective: Filed Vitals:   09/27/12 1923 09/28/12 0619 09/28/12 0700 09/28/12 1245  BP: 173/109 195/90  162/82 160/97  Pulse: 103 106  115  Temp: 97.1 F (36.2 C) 98.4 F (36.9 C)  99.1 F (37.3 C)  TempSrc: Oral Oral  Oral  Resp: 18 18  18   Height:      Weight:      SpO2: 100% 99%  96%    Intake/Output Summary (Last 24 hours) at 09/28/12 1353 Last data filed at 09/28/12 0150  Gross per 24 hour  Intake      0 ml  Output    350 ml  Net   -350 ml   Filed Weights   09/25/12 0132 09/26/12 0616 09/27/12 0330  Weight: 68.4 kg (150 lb 12.7 oz) 66.7 kg (147 lb 0.8 oz) 66.5 kg (146 lb 9.7 oz)    Exam:   General:  Elderly male lying in bed in no acute distress  HEENT: No pallor, moist oral mucosa  Cardiovascular: S1 and S2 regular, no murmurs rub or gallop,  Respiratory: To auscultation bilaterally no added sounds  Abdomen: Laparoscopy site clean. Epigastric tenderness to palpation. Bowel sounds present  Musculoskeletal: , No edema  CNS: AAO x3  Data Reviewed: Basic Metabolic Panel:  Recent Labs Lab 09/24/12 1920 09/25/12 0656 09/26/12 0545 09/28/12 0530 09/28/12 0930  NA 134* 137 133* 136  --   K 4.4 4.3 4.3 4.1  --   CL 99 101 99 100  --   CO2 24 27 23 25   --   GLUCOSE 216* 207* 217* 192*  --   BUN 32* 25* 20 21  --   CREATININE 1.25 1.07 1.05 1.22  --   CALCIUM 9.3 9.2 9.1 9.0  --   MG  --  2.0  --   --  1.7  PHOS  --  3.0  --   --   --    Liver Function Tests:  Recent Labs Lab 09/24/12 1920 09/25/12 0656  AST 22 19  ALT 31 26  ALKPHOS 52 45  BILITOT 0.4 0.8  PROT 7.7 7.5  ALBUMIN 3.3* 3.2*    Recent Labs Lab 09/24/12 1920 09/25/12 0656  LIPASE 124* 110*   No results found for this basename: AMMONIA,  in the last 168 hours CBC:  Recent Labs Lab 09/24/12 1920 09/25/12 0656 09/26/12 0545 09/28/12 0530  WBC 7.2 6.4 6.2 7.5  NEUTROABS 4.6  --   --   --   HGB 13.7 13.7 13.7 12.1*  HCT 40.0 41.1 40.2 36.3*  MCV 85.8 85.8 86.1 87.3  PLT 252 246 223 184   Cardiac Enzymes:  Recent Labs Lab 09/24/12 1921 09/25/12 0121 09/25/12 0656  09/25/12 1309  TROPONINI <0.30 <0.30 <0.30 <0.30   BNP (last 3 results) No results found for this basename: PROBNP,  in the last 8760 hours CBG:  Recent Labs Lab 09/27/12 1149 09/27/12 1643 09/27/12 2136 09/28/12 0617 09/28/12 1120  GLUCAP 252* 122* 217* 197* 236*    No results found for this or any previous visit (from the past 240 hour(s)).   Studies: Dg Cholangiogram Operative  09/27/2012  *RADIOLOGY REPORT*  Clinical Data:   Cholecystitis  INTRAOPERATIVE CHOLANGIOGRAM  Technique:  Cholangiographic images from the C-arm fluoroscopic device were submitted for interpretation post-operatively.  Please see the procedural report for the amount of contrast and  the fluoroscopy time utilized.  Comparison:  None.  Findings:  Contrast fills the biliary tree and duodenum.  There is some reflux of contrast into the pancreatic duct.  There are no filling defects in the common bile duct to suggest common duct stones.  IMPRESSION: Patent biliary tree without evidence of common bile duct stones.   Original Report Authenticated By: Jolaine Click, M.D.     Scheduled Meds: . aspirin EC  81 mg Oral Daily  . atorvastatin  20 mg Oral q1800  . digoxin  0.125 mg Oral Daily  . docusate sodium  100 mg Oral BID  . DULoxetine  20 mg Oral Daily  . enalapril  2.5 mg Oral Daily  . fluticasone  1 spray Each Nare BID  . insulin aspart  0-5 Units Subcutaneous QHS  . insulin aspart  0-9 Units Subcutaneous TID WC  . metoprolol succinate  50 mg Oral BID  . pantoprazole  40 mg Oral Q1200  . senna  1 tablet Oral BID  . sodium chloride  3 mL Intravenous Q12H  . sodium chloride  3 mL Intravenous Q12H  . warfarin  5 mg Oral ONCE-1800  . Warfarin - Pharmacist Dosing Inpatient   Does not apply q1800   Continuous Infusions:     Time spent: 25 minutes    Beronica Lansdale  [facility] Pager [phone] If 7PM-7AM, please contact night-coverage at [email or web address], password [facility] 09/28/2012, 1:53 PM  LOS: 4  days

## 2012-09-28 NOTE — Evaluation (Signed)
Occupational Therapy Evaluation Patient Details Name: [name] MRN: [id number] DOB: 06-Mar-1932 Today's Date: 09/28/2012 Time: 2440-1027 OT Time Calculation (min): 18 min  OT Assessment / Plan / Recommendation Clinical Impression  Pt admitted with epigastric pain. S/p LAPAROSCOPIC CHOLECYSTECTOMY WITH INTRAOPERATIVE CHOLANGIOGRAM on 3/20.  Will benefit from acute OT services to address below problem list.  Pt presenting with mild balance deficits and will be home alone intermittently during day.  Will continue to follow acutely.    OT Assessment  Patient needs continued OT Services    Follow Up Recommendations  Supervision - Intermittent;No OT follow up    Barriers to Discharge Decreased caregiver support pt will be home intermittently during day  Equipment Recommendations   (tbd)    Recommendations for Other Services    Frequency  Min 2X/week    Precautions / Restrictions Restrictions Weight Bearing Restrictions: No   Pertinent Vitals/Pain Some pain    ADL  Eating/Feeding: Performed;Independent Where Assessed - Eating/Feeding: Edge of bed Grooming: Performed;Brushing hair;Independent Where Assessed - Grooming: Unsupported sitting Lower Body Dressing: Performed;Modified independent Where Assessed - Lower Body Dressing: Unsupported sitting Toilet Transfer: Simulated;Min guard Toilet Transfer Method: Sit to Barista:  (bed to chair) Equipment Used: Gait belt Transfers/Ambulation Related to ADLs: Min guard ambulating around bed to chair.  Pt holding onto furniture in room for balance. ADL Comments: Decreased balance during standing components of ADLs.    OT Diagnosis: Generalized weakness;Acute pain  OT Problem List: Decreased strength;Decreased activity tolerance;Impaired balance (sitting and/or standing);Decreased knowledge of use of DME or AE;Pain OT Treatment Interventions: Self-care/ADL training;DME and/or AE instruction;Therapeutic  activities;Balance training;Patient/family education   OT Goals Acute Rehab OT Goals OT Goal Formulation: With patient Time For Goal Achievement: 10/05/12 Potential to Achieve Goals: Good ADL Goals Pt Will Transfer to Toilet: with modified independence;Ambulation;with DME;Regular height toilet ADL Goal: Toilet Transfer - Progress: Goal set today Miscellaneous OT Goals Miscellaneous OT Goal #1: Pt will retrieve ADL items at mod I level with no LOB. OT Goal: Miscellaneous Goal #1 - Progress: Goal set today  Visit Information  Last OT Received On: 09/28/12 Assistance Needed: +1    Subjective Data      Prior Functioning     Home Living Lives With: Daughter Available Help at Discharge: Family;Available PRN/intermittently (family works and/or is in school) Type of Home: House Home Access: Stairs to enter Secretary/administrator of Steps: 2 Entrance Stairs-Rails: Left;Right Home Layout: Two level;Able to live on main level with bedroom/bathroom Alternate Level Stairs-Number of Steps: flight Alternate Level Stairs-Rails: Right;Left Home Adaptive Equipment: Straight cane Pt unable to provide home information due to no interpreter available.  PLOF and home living info gathered from PT eval note.  Prior Function Level of Independence: Independent with assistive device(s) Able to Take Stairs?: Yes Communication Communication: Prefers language other than English         Vision/Perception     Cognition  Cognition Overall Cognitive Status: Appears within functional limits for tasks assessed/performed Arousal/Alertness: Awake/alert Orientation Level: Appears intact for tasks assessed Behavior During Session: [facility] for tasks performed    Extremity/Trunk Assessment Right Upper Extremity Assessment RUE ROM/Strength/Tone: [facility] for tasks assessed Left Upper Extremity Assessment LUE ROM/Strength/Tone: [facility] for tasks assessed     Mobility Bed Mobility Bed Mobility: Supine to  Sit;Sitting - Scoot to Edge of Bed Supine to Sit: 6: Modified independent (Device/Increase time) Sitting - Scoot to Edge of Bed: 6: Modified independent (Device/Increase time) Transfers Transfers: Sit to Stand;Stand to Sit  Sit to Stand: 5: Supervision;From bed;With upper extremity assist Stand to Sit: 5: Supervision;To chair/3-in-1;With upper extremity assist     Exercise     Balance Balance Balance Assessed: Yes Static Standing Balance Static Standing - Balance Support: No upper extremity supported Static Standing - Level of Assistance: 5: Stand by assistance Static Standing - Comment/# of Minutes: ~1-2 minutes while looking out window Dynamic Standing Balance Dynamic Standing - Balance Support: Right upper extremity supported;During functional activity Dynamic Standing - Level of Assistance: 5: Stand by assistance;4: Min assist Dynamic Standing - Balance Activities: Forward lean/weight shifting;Reaching for objects Dynamic Standing - Comments: Close guarding for safety. Pt holding onto window sill/wall when leaning to reach for items on bed.   End of Session OT - End of Session Equipment Utilized During Treatment: Gait belt Activity Tolerance: Patient tolerated treatment well Patient left: in chair;with call bell/phone within reach Nurse Communication: Mobility status (pt up in chair)  GO    09/28/2012 [name] OTR/L Pager [phone] Office [phone]  [name] 09/28/2012, 8:46 AM

## 2012-09-28 NOTE — Progress Notes (Signed)
Patient Name: [name] Date of Encounter: 09/28/2012     Active Problems:   DIABETES MELLITUS   HYPERTENSION, BENIGN   FIBRILLATION, ATRIAL   Chest pain   Cholelithiases   Sinusitis   Unsteady gait   CAD (coronary artery disease)   Hx of CABG    SUBJECTIVE  Appears stable post op.  No interpreter available but gave me the thumbs up.  "oK"  CURRENT MEDS . aspirin EC  81 mg Oral Daily  . atorvastatin  20 mg Oral q1800  . digoxin  0.125 mg Oral Daily  . docusate sodium  100 mg Oral BID  . DULoxetine  20 mg Oral Daily  . enalapril  2.5 mg Oral Daily  . fluticasone  1 spray Each Nare BID  . insulin aspart  0-5 Units Subcutaneous QHS  . insulin aspart  0-9 Units Subcutaneous TID WC  . metoprolol succinate  50 mg Oral BID  . pantoprazole  40 mg Oral Q1200  . senna  1 tablet Oral BID  . sodium chloride  3 mL Intravenous Q12H  . sodium chloride  3 mL Intravenous Q12H    OBJECTIVE  Filed Vitals:   09/27/12 1457 09/27/12 1923 09/28/12 0619 09/28/12 0700  BP: 173/71 173/109 195/90 162/82  Pulse:  103 106   Temp:  97.1 F (36.2 C) 98.4 F (36.9 C)   TempSrc:  Oral Oral   Resp:  18 18   Height:      Weight:      SpO2:  100% 99%     Intake/Output Summary (Last 24 hours) at 09/28/12 0810 Last data filed at 09/28/12 0150  Gross per 24 hour  Intake   1900 ml  Output    475 ml  Net   1425 ml   Filed Weights   09/25/12 0132 09/26/12 0616 09/27/12 0330  Weight: 150 lb 12.7 oz (68.4 kg) 147 lb 0.8 oz (66.7 kg) 146 lb 9.7 oz (66.5 kg)    PHYSICAL EXAM  General: Pleasant, NAD. Neuro: Alert and oriented X 3. Moves all extremities spontaneously. Psych: Normal affect. HEENT:  Normal  Neck: Supple without bruits or JVD. Lungs:  Decrease in bases Heart: irregularly irregular Abdomen: Soft, non-tender, non-distended, BS + x 4.   Small incisions look good.   Extremities: No clubbing, cyanosis or edema. DP/PT/Radials 2+ and equal bilaterally.  Accessory  Clinical Findings  CBC  Recent Labs  09/26/12 0545 09/28/12 0530  WBC 6.2 7.5  HGB 13.7 12.1*  HCT 40.2 36.3*  MCV 86.1 87.3  PLT 223 184   Basic Metabolic Panel  Recent Labs  09/26/12 0545 09/28/12 0530  NA 133* 136  K 4.3 4.1  CL 99 100  CO2 23 25  GLUCOSE 217* 192*  BUN 20 21  CREATININE 1.05 1.22  CALCIUM 9.1 9.0   Liver Function Tests No results found for this basename: AST, ALT, ALKPHOS, BILITOT, PROT, ALBUMIN,  in the last 72 hours No results found for this basename: LIPASE, AMYLASE,  in the last 72 hours Cardiac Enzymes  Recent Labs  09/25/12 1309  TROPONINI <0.30   BNP No components found with this basename: POCBNP,  D-Dimer No results found for this basename: DDIMER,  in the last 72 hours Hemoglobin A1C No results found for this basename: HGBA1C,  in the last 72 hours Fasting Lipid Panel No results found for this basename: CHOL, HDL, LDLCALC, TRIG, CHOLHDL, LDLDIRECT,  in the last 72 hours Thyroid Function Tests No  results found for this basename: TSH, T4TOTAL, FREET3, T3FREE, THYROIDAB,  in the last 72 hours  TELE  Atrial fib with controlled vent response  ECG  None yesterday  Radiology/Studies  Dg Chest 2 View  09/24/2012  *RADIOLOGY REPORT*  Clinical Data: Chest pain.  Weakness. Shortness of breath.  CHEST - 2 VIEW  Comparison: 09/07/2010.  Findings:  Post CABG.  Heart size top normal.  Chronic lung changes with central pulmonary vascular prominence but without frank pulmonary edema, segmental infiltrate or pneumothorax.  Calcified mildly tortuous aorta.  IMPRESSION: No acute abnormality.  Please see above.   Original Report Authenticated By: Lacy Duverney, M.D.    Dg Cholangiogram Operative  09/27/2012  *RADIOLOGY REPORT*  Clinical Data:   Cholecystitis  INTRAOPERATIVE CHOLANGIOGRAM  Technique:  Cholangiographic images from the C-arm fluoroscopic device were submitted for interpretation post-operatively.  Please see the procedural report  for the amount of contrast and the fluoroscopy time utilized.  Comparison:  None.  Findings:  Contrast fills the biliary tree and duodenum.  There is some reflux of contrast into the pancreatic duct.  There are no filling defects in the common bile duct to suggest common duct stones.  IMPRESSION: Patent biliary tree without evidence of common bile duct stones.   Original Report Authenticated By: Jolaine Click, M.D.    Ct Head Wo Contrast  09/24/2012  *RADIOLOGY REPORT*  Clinical Data: Dizziness.  Generalized weakness.  Current history of hypertension.  CT HEAD WITHOUT CONTRAST  Technique:  Contiguous axial images were obtained from the base of the skull through the vertex without contrast.  Comparison: Unenhanced cranial CT 12/18/2006.  Findings: Moderate cortical and deep atrophy and mild to moderate cerebellar atrophy, unchanged.  Moderate to severe changes of small vessel disease of the white matter diffusely, unchanged.  Old lacunar strokes in the right basal ganglia, unchanged.  No mass lesion.  No midline shift.  No acute hemorrhage or hematoma.  No extra-axial fluid collections.  No evidence of acute infarction. No significant interval change.  No skull fracture or other focal osseous abnormality involving the skull.  Opacification of both maxillary sinuses, right greater than left, with air-fluid levels.  Opacification of scattered ethmoid air cells bilaterally.  Inspissated mucous in the right sphenoid sinus with a small fluid level.  Bilateral mastoid air cells and middle ear cavities well-aerated.  IMPRESSION:  1.  No acute intracranial abnormality. 2.  Stable moderate generalized atrophy, moderate to severe chronic microvascular ischemic changes of the white matter, and old lacunar strokes in the right basal ganglia. 3.  Acute bilateral maxillary sinusitis, right greater than left. Bilateral ethmoid sinusitis which may be acute or chronic.  Mild acute right sphenoid sinusitis.   Original Report  Authenticated By: Hulan Saas, M.D.    US Abdomen Complete  09/24/2012  *RADIOLOGY REPORT*  Clinical Data:  Abdominal pain.  Elevated lipase and  BUN. Diabetic hypertensive patient.  COMPLETE ABDOMINAL ULTRASOUND  Comparison:  None.  Findings:  Gallbladder:  Echogenic shadowing in the gallbladder fossa suggestive of gallstones.  Gallbladder wall thickening of the 3.7 mm.  Per ultrasound technologist, the patient was not tender over this region during scanning.  Common bile duct:  6.5 mm proximally.  The mid and distal aspect not visualized secondary to bowel gas.  Liver:  No focal lesion identified.  Within normal limits in parenchymal echogenicity.  IVC:  Appears normal.  Pancreas:  Not visualized secondary to bowel gas.  Spleen:  8.9 cm.  Accessory splenic tissue  noted.  Right Kidney:  9.1 cm. No hydronephrosis or renal mass.Mild increased echogenicity may represent result of medical renal disease type changes.  Left Kidney:  10.7 cm.  No hydronephrosis or renal mass.Mild increased echogenicity may represent result of medical renal disease type changes.  Abdominal aorta:  Ectatic and calcified consistent with atherosclerotic type changes.  The full extent of the aorta is not imaged secondary to bowel gas.  Portions visualized with maximal AP dimension 2.8 cm.  IMPRESSION: Echogenic shadowing in the gallbladder fossa suggestive of gallstones.  Gallbladder wall thickening of the 3.7 mm.  Pancreas, portions of the abdominal aorta and mid to distal common bile duct not visualized secondary to bowel gas.  Increased echogenicity of renal parenchyma may represent result of medical renal disease type changes.   Original Report Authenticated By: Lacy Duverney, M.D.    Nm Myocar Multi W/spect W/wall Motion / Ef  09/26/2012  *RADIOLOGY REPORT*  Clinical Data:  The patient has known coronary disease.  There has been some chest pain.  The study is done for further evaluation.  MYOCARDIAL IMAGING WITH SPECT (REST AND  PHARMACOLOGIC-STRESS) GATED LEFT VENTRICULAR WALL MOTION STUDY LEFT VENTRICULAR EJECTION FRACTION  Technique:  Standard myocardial SPECT imaging was performed after resting intravenous injection of  mCi Tc-57m . Subsequently, intravenous infusion of  was performed under the supervision of the Cardiology staff.  At peak effect of the drug,  mCi Tc-73m  was injected intravenously and standard myocardial SPECT  imaging was performed.  Quantitative gated imaging was also performed to evaluate left ventricular wall motion, and estimate left ventricular ejection fraction.  FINDINGS:  The patient was stressed with Lexiscan. He tolerated the infusion without major complaints.  The raw images revealed no evidence of excess motion.  Tomographic images reveal a question of decreased counts in the inferolateral wall.  I believe this is related to the shape of his ventricle.  It is possible there could be some scar in this area.  The stress image reveals moderate decreased activity at the base of the inferolateral wall.  The rest image reveals moderate decreased activity at the base of the inferolateral wall.  There is no reversibility.  Wall motion is completely normal.  The ejection fraction is 55%.  IMPRESSION: This study shows no definite ischemia.  There is normal wall motion.  There is question of  scar at the base of the inferolateral wall.  It is possible that this finding is related to the shape of the ventricle.  Overall this is a low risk scan.   Original Report Authenticated By: Willa Rough, M.D.     ASSESSMENT AND PLAN  1.  SP CABG with low risk nuclear imaging 2.  Chronic atrial fib on warfarin  Resume warfarin when appropriate with surgery.  Has not been seen in cardiology.  Not sure who follows INR.  This will need to be established.  It obviously is.    Signed, Shawnie Pons MD, [facility], FSCAI

## 2012-09-28 NOTE — Progress Notes (Signed)
ANTICOAGULATION CONSULT NOTE - Initial Consult  Pharmacy Consult for coumadin Indication: chest pain/ACS  Allergies  Allergen Reactions  . Percocet (Oxycodone-Acetaminophen)     Patient Measurements: Height: 5\' 6"  (167.6 cm) Weight: 146 lb 9.7 oz (66.5 kg) IBW/kg (Calculated) : 63.8 Heparin Dosing Weight:   Vital Signs: Temp: 98.4 F (36.9 C) (03/21 0619) Temp src: Oral (03/21 0619) BP: 162/82 mmHg (03/21 0700) Pulse Rate: 106 (03/21 0619)  Labs:  Recent Labs  09/25/12 1309 09/26/12 0545 09/27/12 0510 09/28/12 0530  HGB  --  13.7  --  12.1*  HCT  --  40.2  --  36.3*  PLT  --  223  --  184  APTT  --  33  --   --   LABPROT  --  15.8* 14.5 14.8  INR  --  1.29 1.15 1.18  CREATININE  --  1.05  --  1.22  TROPONINI <0.30  --   --   --     Estimated Creatinine Clearance: 45 ml/min (by C-G formula based on Cr of 1.22).   Medical History: Past Medical History  Diagnosis Date  . Permanent atrial fibrillation   . Hypertension   . HNP (herniated nucleus pulposus), lumbar   . PVD (peripheral vascular disease)   . Diabetes mellitus   . CAD (coronary artery disease)     s/p CABG x 4  . Ventricular hypertrophy     Medications:  Scheduled:  . [COMPLETED] ampicillin-sulbactam (UNASYN) IV  3 g Intravenous On Call  . aspirin EC  81 mg Oral Daily  . atorvastatin  20 mg Oral q1800  . digoxin  0.125 mg Oral Daily  . docusate sodium  100 mg Oral BID  . DULoxetine  20 mg Oral Daily  . enalapril  2.5 mg Oral Daily  . fluticasone  1 spray Each Nare BID  . [COMPLETED] insulin aspart      . insulin aspart  0-5 Units Subcutaneous QHS  . insulin aspart  0-9 Units Subcutaneous TID WC  . metoprolol succinate  50 mg Oral BID  . pantoprazole  40 mg Oral Q1200  . senna  1 tablet Oral BID  . sodium chloride  3 mL Intravenous Q12H  . sodium chloride  3 mL Intravenous Q12H   Infusions:    Assessment: 79 yo male with history of afib s/p lap chole will be resumed on coumadin  today.  INR today is 1.18 since being reversed with vitamin K few days ago.  Was on coumadin 5.25mg  po qday prior to admission. Goal of Therapy:  INR 2-3    Plan:  1) Coumadin 5mg  po x1 2) INR in am.  Vaneta Hammontree, Tsz-Yin 09/28/2012,9:20 AM

## 2012-09-29 DIAGNOSIS — I25810 Atherosclerosis of coronary artery bypass graft(s) without angina pectoris: Secondary | ICD-10-CM

## 2012-09-29 LAB — PROTIME-INR: INR: 1.18 (ref 0.00–1.49)

## 2012-09-29 LAB — GLUCOSE, CAPILLARY: Glucose-Capillary: 164 mg/dL — ABNORMAL HIGH (ref 70–99)

## 2012-09-29 MED ORDER — METFORMIN HCL 500 MG PO TABS: 500.0000 mg | ORAL_TABLET | Freq: Two times a day (BID) | ORAL | Status: DC

## 2012-09-29 MED ORDER — METOPROLOL SUCCINATE ER 50 MG PO TB24: 50.0000 mg | ORAL_TABLET | Freq: Two times a day (BID) | ORAL | Status: DC

## 2012-09-29 MED ORDER — DIGOXIN 250 MCG PO TABS: 250.0000 ug | ORAL_TABLET | Freq: Every day | ORAL | Status: DC

## 2012-09-29 MED ORDER — WARFARIN SODIUM 7.5 MG PO TABS
7.5000 mg | ORAL_TABLET | Freq: Once | ORAL | Status: AC
  Administered 2012-09-29: 7.5 mg via ORAL
  Filled 2012-09-29: qty 1

## 2012-09-29 MED ORDER — WARFARIN SODIUM 7.5 MG PO TABS: 7.5000 mg | ORAL_TABLET | Freq: Once | ORAL | Status: DC

## 2012-09-29 MED ORDER — HYDROCODONE-ACETAMINOPHEN 5-325 MG PO TABS: 1.0000 | ORAL_TABLET | ORAL | Status: DC | PRN

## 2012-09-29 MED ORDER — ENALAPRIL MALEATE 5 MG PO TABS: 5.0000 mg | ORAL_TABLET | Freq: Every day | ORAL | Status: DC

## 2012-09-29 MED ORDER — GLUCOSE BLOOD VI STRP: ORAL_STRIP | Status: DC

## 2012-09-29 MED ORDER — ATORVASTATIN CALCIUM 20 MG PO TABS: 20.0000 mg | ORAL_TABLET | Freq: Every day | ORAL | Status: DC

## 2012-09-29 MED ORDER — GLYBURIDE 5 MG PO TABS: 5.0000 mg | ORAL_TABLET | Freq: Every day | ORAL | Status: DC

## 2012-09-29 MED ORDER — ALPRAZOLAM 0.5 MG PO TABS: 0.5000 mg | ORAL_TABLET | Freq: Two times a day (BID) | ORAL | Status: DC | PRN

## 2012-09-29 MED ORDER — ASPIRIN 81 MG PO TBEC: 81.0000 mg | DELAYED_RELEASE_TABLET | Freq: Every day | ORAL | Status: DC

## 2012-09-29 NOTE — Progress Notes (Signed)
ANTICOAGULATION CONSULT NOTE - Follow Up Consult  Pharmacy Consult for coumadin Indication: atrial fibrillation  Allergies  Allergen Reactions  . Percocet (Oxycodone-Acetaminophen)     Patient Measurements: Height: 5\' 6"  (167.6 cm) Weight: 149 lb 7.6 oz (67.8 kg) IBW/kg (Calculated) : 63.8 Heparin Dosing Weight:   Vital Signs: Temp: 97.8 F (36.6 C) (03/22 0420) Temp src: Oral (03/22 0420) BP: 145/88 mmHg (03/22 0502) Pulse Rate: 92 (03/22 0420)  Labs:  Recent Labs  09/27/12 0510 09/28/12 0530 09/29/12 0600  HGB  --  12.1*  --   HCT  --  36.3*  --   PLT  --  184  --   LABPROT 14.5 14.8 14.8  INR 1.15 1.18 1.18  CREATININE  --  1.22  --     Estimated Creatinine Clearance: 45 ml/min (by C-G formula based on Cr of 1.22).   Medications:  Scheduled:  . aspirin EC  81 mg Oral Daily  . atorvastatin  20 mg Oral q1800  . digoxin  0.125 mg Oral Daily  . docusate sodium  100 mg Oral BID  . DULoxetine  20 mg Oral Daily  . enalapril  5 mg Oral Daily  . fluticasone  1 spray Each Nare BID  . glyBURIDE  5 mg Oral Q breakfast  . insulin aspart  0-5 Units Subcutaneous QHS  . insulin aspart  0-9 Units Subcutaneous TID WC  . [COMPLETED] magnesium sulfate 1 - 4 g bolus IVPB  1 g Intravenous Once  . metoprolol succinate  50 mg Oral BID  . pantoprazole  40 mg Oral Q1200  . senna  1 tablet Oral BID  . sodium chloride  3 mL Intravenous Q12H  . sodium chloride  3 mL Intravenous Q12H  . [COMPLETED] warfarin  5 mg Oral ONCE-1800  . Warfarin - Pharmacist Dosing Inpatient   Does not apply q1800  . [DISCONTINUED] enalapril  2.5 mg Oral Daily   Infusions:    Assessment: 79 yo male with afib is currently on subtherapeutic coumadin s/p lap chole.  INR today is 1.18 (probably still some residual effect from the vitamin K that he received few days ago) Goal of Therapy:  INR 2-3    Plan:  1) Coumadin 7.5mg  po x1. 2) If patient goes home today, recommend to resume home dose.  If  the coumadin 7.5mg  dose is given to patient prior to discharge, instruct patient not to resume home dose until tomorrow. 3) INR in am if still here.  Demara Lover, Tsz-Yin 09/29/2012,10:15 AM

## 2012-09-29 NOTE — Discharge Summary (Addendum)
Physician Discharge Summary  [name] OZH:[id number] DOB: [birth year] DOA: 09/24/2012  PCP: Pcp Not In System  Admit date: 09/24/2012 Discharge date: 09/29/2012  Time spent: 40  minutes  Recommendations for Outpatient Follow-up:  Home with outpt follow up  Needs INR checked during outpatient visit   Discharge Diagnoses:  Principal Problem:   Cholelithiases  Active Problems:   CAD (coronary artery disease) s/pCABG   HYPERTENSION, BENIGN   FIBRILLATION, ATRIAL   DIABETES MELLITUS   Chest pain   Sinusitis   Unsteady gait   Non compliance anxiety   Discharge Condition: fair  Diet recommendation: diabetic  Filed Weights   09/26/12 0616 09/27/12 0330 09/29/12 0420  Weight: 66.7 kg (147 lb 0.8 oz) 66.5 kg (146 lb 9.7 oz) 67.8 kg (149 lb 7.6 oz)    History of present illness:  79 y.o. male with a past medical history of Atrial fibrillation; Hypotension; HNP (herniated nucleus pulposus), lumbar; PVD (peripheral vascular disease); Diabetes mellitus; CAD (coronary artery disease); and Ventricular hypertrophy. Patient moved here from [place] since 2006 he has been non-compliant with his medications and his daughter is worried that he might be depressed. While visiting to [place] he had a suicidal attempt in 2013. He was diagnosed with situational depression but he refuses to take his medication. He has been crying out at night. Patient is straight about dying he is constantly feeling his pulse to see if his heart was still beating. Patient has been complaining about epigastric pain that has been there for the past 2-3 years but it seems to get worse recently. His lipase was slightly elevated to 125 and in emergency department US showed cholelithiasis but no biliary dilatation. No nausea or vomiting no liver function abnormalities. He reports some dizziness and sensation that he is unsteady on his feet this has been going on for few years but now it is worse. He presented to the ED with  shortness of breath and chest discomfort and has been worried that he will die. His daughter has taken over his medications because she is concerned he may be overmedicating himself. Patient has history of coronary artery disease and while visiting to [place] in 2004 have had a quadruple bypass.  Patient admitted to medical floor and ultrasound of the abdomen showed cholelithiasis.   Hospital Course:    Epigastric pain with cholelithiasis  - surgery consulted she had laparoscopic cholecystectomy done on 3/20. Tolerated procedure well. Has been tolerating advanced diet and pain improving. - cardiology consulted for peri-op management. Underwent nuclear stress test which was negative for ischemia.   Chest pain  -Appear to be epigastric. 2-D echo unremarkable. negative nuclear stress test   Atrial fibrillation  Cardizem replaced by metoprolol in the hospital. Patient was getting tachycardic on ambulation.increased the dose of metoprolol. Continue digoxin. I have resumed his Coumadin . INR is subtherapeutic. [facility] 1 and will have his INR checked  then.   Hypertension  Medication noncompliance. Changed Cardizem to metoprolol. Added vasotec as well.  diabetes mellitus  Patient apparently has not been taking any medications. Hemoglobin A1c is 9. I have started  him on metformin and glipizide. aslo prescribed a glucometer. Have arranged follow up at the adult health care center.     Unsteady gait -  Ongoing for several years.. no evidence of recent CVA but a history of past CVAs evident on the CT scan. Continue low dose aspirin and Coumadin.  -  PT recommended home health PT on discharge.   ?depression  TSH normal. Denies any depressive symptoms or suicidal ideations. Does seem to be quite anxious at times. Will Start him on a low dose of Xanax. Would benefit from outpatient psych evaluation.  Acute sinusitis  Treated with a  course of augmentin  Code Status: full code  Family Communication: spoke with daughter over the phone  Disposition Plan: Home likely tomorrow if stable  Consultants:  [facility]  surgery Procedures:  Exercise stress test  lap choly on 3/20 Antibiotics:  Augmentin   Procedures:    Consultations:  Cardiology   [facility]   Disposition  patient has issues with non compliance to medications and follow up. He has multiple underlining medical issues for which he needs compliance and follow up. i have explained to the patient in presence of his daughter today and reinforced the ned to take his medications for his Afib, DM and CAD.   Discharge Exam: Filed Vitals:   09/28/12 1950 09/29/12 0420 09/29/12 0502 09/29/12 1352  BP: 168/88 139/113 145/88 129/63  Pulse: 88 92  83  Temp: 97.5 F (36.4 C) 97.8 F (36.6 C)  97.7 F (36.5 C)  TempSrc: Oral Oral  Oral  Resp: 18 18  16   Height:      Weight:  67.8 kg (149 lb 7.6 oz)    SpO2: 100% 98%  97%    General: Elderly male lying in bed in no acute distress  HEENT: No pallor, moist oral mucosa  Cardiovascular: S1 and S2 regular, no murmurs rub or gallop,  Respiratory: To auscultation bilaterally no added sounds  Abdomen: Laparoscopy site clean. Epigastric tenderness to palpation. Bowel sounds present  Musculoskeletal: , No edema  CNS: AAO x3   Discharge Instructions  Discharge Orders   Future Orders Complete By Expires     For home use only DME Glucometer  As directed         Medication List    STOP taking these medications       amiloride-hydrochlorothiazide 5-50 MG tablet  Commonly known as:  MODURETIC     diltiazem 60 MG tablet  Commonly known as:  CARDIZEM     fish oil-omega-3 fatty acids 1000 MG capsule     vitamin C 500 MG tablet  Commonly known as:  ASCORBIC ACID      TAKE these medications       ALPRAZolam 0.5 MG tablet  Commonly known as:  XANAX  Take 1 tablet (0.5 mg total) by  mouth 2 (two) times daily as needed for anxiety.     aspirin 81 MG EC tablet  Take 1 tablet (81 mg total) by mouth daily.     atorvastatin 20 MG tablet  Commonly known as:  LIPITOR  Take 1 tablet (20 mg total) by mouth daily at 6 PM.     digoxin 0.25 MG tablet  Commonly known as:  LANOXIN  Take 1 tablet (250 mcg total) by mouth daily.     enalapril 5 MG tablet  Commonly known as:  VASOTEC  Take 1 tablet (5 mg total) by mouth daily.     glucose blood test strip  Commonly known as:  ACCU-CHEK ADVANTAGE TEST  Use as instructed     glyBURIDE 5 MG tablet  Commonly known as:  DIABETA  Take 1 tablet (5 mg total) by mouth daily with breakfast.     HYDROcodone-acetaminophen 5-325 MG per tablet  Commonly known as:  NORCO/VICODIN  Take 1 tablet by mouth every 4 (four) hours as needed.     metFORMIN 500 MG tablet  Commonly known as:  GLUCOPHAGE  Take 1 tablet (500 mg total) by mouth 2 (two) times daily with a meal.     metoprolol succinate 50 MG 24 hr tablet  Commonly known as:  TOPROL-XL  Take 1 tablet (50 mg total) by mouth 2 (two) times daily. Take with or immediately following a meal.     warfarin 7.5 MG tablet  Commonly known as:  COUMADIN  Take 1 tablet (7.5 mg total) by mouth one time only at 6 PM.           Follow-up Information   Follow up with [facility] On 10/05/2012. (12:30  Please bring all medications you are currently taking with you.)    Contact information:   [address] [place] [address] [phone]  [facility]      Follow up with [facility] On 10/16/2012. (12:15pm, arrive at 11:45am)    Contact information:   [address]   [place] [address] [phone]       Follow up with Thurmon Fair, MD On 10/09/2012.   Contact information:   [address] [place] [address] [phone]        The results of significant diagnostics from this  hospitalization (including imaging, microbiology, ancillary and laboratory) are listed below for reference.    Significant Diagnostic Studies: Dg Chest 2 View  09/24/2012  *RADIOLOGY REPORT*  Clinical Data: Chest pain.  Weakness. Shortness of breath.  CHEST - 2 VIEW  Comparison: 09/07/2010.  Findings:  Post CABG.  Heart size top normal.  Chronic lung changes with central pulmonary vascular prominence but without frank pulmonary edema, segmental infiltrate or pneumothorax.  Calcified mildly tortuous aorta.  IMPRESSION: No acute abnormality.  Please see above.   Original Report Authenticated By: Lacy Duverney, M.D.    Dg Cholangiogram Operative  09/27/2012  *RADIOLOGY REPORT*  Clinical Data:   Cholecystitis  INTRAOPERATIVE CHOLANGIOGRAM  Technique:  Cholangiographic images from the C-arm fluoroscopic device were submitted for interpretation post-operatively.  Please see the procedural report for the amount of contrast and the fluoroscopy time utilized.  Comparison:  None.  Findings:  Contrast fills the biliary tree and duodenum.  There is some reflux of contrast into the pancreatic duct.  There are no filling defects in the common bile duct to suggest common duct stones.  IMPRESSION: Patent biliary tree without evidence of common bile duct stones.   Original Report Authenticated By: Jolaine Click, M.D.    Ct Head Wo Contrast  09/24/2012  *RADIOLOGY REPORT*  Clinical Data: Dizziness.  Generalized weakness.  Current history of hypertension.  CT HEAD WITHOUT CONTRAST  Technique:  Contiguous axial images were obtained from the base of the skull through the vertex without contrast.  Comparison: Unenhanced cranial CT 12/18/2006.  Findings: Moderate cortical and deep atrophy and mild to moderate cerebellar atrophy, unchanged.  Moderate to severe changes of small vessel disease of the white matter diffusely, unchanged.  Old lacunar strokes in the right basal ganglia, unchanged.  No mass lesion.  No midline shift.  No  acute hemorrhage or hematoma.  No extra-axial fluid collections.  No evidence of acute infarction. No significant interval change.  No skull fracture or other focal osseous abnormality involving the skull.  Opacification of both maxillary sinuses, right greater than left,  with air-fluid levels.  Opacification of scattered ethmoid air cells bilaterally.  Inspissated mucous in the right sphenoid sinus with a small fluid level.  Bilateral mastoid air cells and middle ear cavities well-aerated.  IMPRESSION:  1.  No acute intracranial abnormality. 2.  Stable moderate generalized atrophy, moderate to severe chronic microvascular ischemic changes of the white matter, and old lacunar strokes in the right basal ganglia. 3.  Acute bilateral maxillary sinusitis, right greater than left. Bilateral ethmoid sinusitis which may be acute or chronic.  Mild acute right sphenoid sinusitis.   Original Report Authenticated By: Hulan Saas, M.D.    US Abdomen Complete  09/24/2012  *RADIOLOGY REPORT*  Clinical Data:  Abdominal pain.  Elevated lipase and  BUN. Diabetic hypertensive patient.  COMPLETE ABDOMINAL ULTRASOUND  Comparison:  None.  Findings:  Gallbladder:  Echogenic shadowing in the gallbladder fossa suggestive of gallstones.  Gallbladder wall thickening of the 3.7 mm.  Per ultrasound technologist, the patient was not tender over this region during scanning.  Common bile duct:  6.5 mm proximally.  The mid and distal aspect not visualized secondary to bowel gas.  Liver:  No focal lesion identified.  Within normal limits in parenchymal echogenicity.  IVC:  Appears normal.  Pancreas:  Not visualized secondary to bowel gas.  Spleen:  8.9 cm.  Accessory splenic tissue noted.  Right Kidney:  9.1 cm. No hydronephrosis or renal mass.Mild increased echogenicity may represent result of medical renal disease type changes.  Left Kidney:  10.7 cm.  No hydronephrosis or renal mass.Mild increased echogenicity may represent result of  medical renal disease type changes.  Abdominal aorta:  Ectatic and calcified consistent with atherosclerotic type changes.  The full extent of the aorta is not imaged secondary to bowel gas.  Portions visualized with maximal AP dimension 2.8 cm.  IMPRESSION: Echogenic shadowing in the gallbladder fossa suggestive of gallstones.  Gallbladder wall thickening of the 3.7 mm.  Pancreas, portions of the abdominal aorta and mid to distal common bile duct not visualized secondary to bowel gas.  Increased echogenicity of renal parenchyma may represent result of medical renal disease type changes.   Original Report Authenticated By: Lacy Duverney, M.D.    Nm Myocar Multi W/spect W/wall Motion / Ef  09/26/2012  *RADIOLOGY REPORT*  Clinical Data:  The patient has known coronary disease.  There has been some chest pain.  The study is done for further evaluation.  MYOCARDIAL IMAGING WITH SPECT (REST AND PHARMACOLOGIC-STRESS) GATED LEFT VENTRICULAR WALL MOTION STUDY LEFT VENTRICULAR EJECTION FRACTION  Technique:  Standard myocardial SPECT imaging was performed after resting intravenous injection of  mCi Tc-32m . Subsequently, intravenous infusion of  was performed under the supervision of the Cardiology staff.  At peak effect of the drug,  mCi Tc-98m  was injected intravenously and standard myocardial SPECT  imaging was performed.  Quantitative gated imaging was also performed to evaluate left ventricular wall motion, and estimate left ventricular ejection fraction.  FINDINGS:  The patient was stressed with Lexiscan. He tolerated the infusion without major complaints.  The raw images revealed no evidence of excess motion.  Tomographic images reveal a question of decreased counts in the inferolateral wall.  I believe this is related to the shape of his ventricle.  It is possible there could be some scar in this area.  The stress image reveals moderate decreased activity at the base of the inferolateral wall.  The rest image  reveals moderate decreased activity at the base of the inferolateral  wall.  There is no reversibility.  Wall motion is completely normal.  The ejection fraction is 55%.  IMPRESSION: This study shows no definite ischemia.  There is normal wall motion.  There is question of  scar at the base of the inferolateral wall.  It is possible that this finding is related to the shape of the ventricle.  Overall this is a low risk scan.   Original Report Authenticated By: Willa Rough, M.D.     Microbiology: No results found for this or any previous visit (from the past 240 hour(s)).   Labs: Basic Metabolic Panel:  Recent Labs Lab 09/24/12 1920 09/25/12 0656 09/26/12 0545 09/28/12 0530 09/28/12 0930  NA 134* 137 133* 136  --   K 4.4 4.3 4.3 4.1  --   CL 99 101 99 100  --   CO2 24 27 23 25   --   GLUCOSE 216* 207* 217* 192*  --   BUN 32* 25* 20 21  --   CREATININE 1.25 1.07 1.05 1.22  --   CALCIUM 9.3 9.2 9.1 9.0  --   MG  --  2.0  --   --  1.7  PHOS  --  3.0  --   --   --    Liver Function Tests:  Recent Labs Lab 09/24/12 1920 09/25/12 0656  AST 22 19  ALT 31 26  ALKPHOS 52 45  BILITOT 0.4 0.8  PROT 7.7 7.5  ALBUMIN 3.3* 3.2*    Recent Labs Lab 09/24/12 1920 09/25/12 0656  LIPASE 124* 110*   No results found for this basename: AMMONIA,  in the last 168 hours CBC:  Recent Labs Lab 09/24/12 1920 09/25/12 0656 09/26/12 0545 09/28/12 0530  WBC 7.2 6.4 6.2 7.5  NEUTROABS 4.6  --   --   --   HGB 13.7 13.7 13.7 12.1*  HCT 40.0 41.1 40.2 36.3*  MCV 85.8 85.8 86.1 87.3  PLT 252 246 223 184   Cardiac Enzymes:  Recent Labs Lab 09/24/12 1921 09/25/12 0121 09/25/12 0656 09/25/12 1309  TROPONINI <0.30 <0.30 <0.30 <0.30   BNP: BNP (last 3 results) No results found for this basename: PROBNP,  in the last 8760 hours CBG:  Recent Labs Lab 09/28/12 1120 09/28/12 1616 09/28/12 2100 09/29/12 0607 09/29/12 1109  GLUCAP 236* 201* 196* 164* 203*        Signed:  Jalexis Breed  [facility] 09/29/2012, 3:57 PM

## 2012-09-29 NOTE — Progress Notes (Signed)
Patient ID: [name], male   DOB: 08-Mar-1932, 79 y.o.   MRN: [id number] 2 Days Post-Op  Subjective: Pt still c/o some pain, but typical incisional pain.  C/o weak feeling.  Eating ok.  Objective: Vital signs in last 24 hours: Temp:  [97.5 F (36.4 C)-99.1 F (37.3 C)] 97.8 F (36.6 C) (03/22 0420) Pulse Rate:  [88-115] 92 (03/22 0420) Resp:  [18] 18 (03/22 0420) BP: (139-168)/(88-113) 145/88 mmHg (03/22 0502) SpO2:  [96 %-100 %] 98 % (03/22 0420) Weight:  [149 lb 7.6 oz (67.8 kg)] 149 lb 7.6 oz (67.8 kg) (03/22 0420) Last BM Date: 09/24/12  Intake/Output from previous day: 03/21 0701 - 03/22 0700 In: 360 [P.O.:360] Out: -  Intake/Output this shift:    PE: Abd: soft, less tender today, +BS, ND, incisions c/d/i  Lab Results:   Recent Labs  09/28/12 0530  WBC 7.5  HGB 12.1*  HCT 36.3*  PLT 184   BMET  Recent Labs  09/28/12 0530  NA 136  K 4.1  CL 100  CO2 25  GLUCOSE 192*  BUN 21  CREATININE 1.22  CALCIUM 9.0   PT/INR  Recent Labs  09/28/12 0530 09/29/12 0600  LABPROT 14.8 14.8  INR 1.18 1.18   CMP     Component Value Date/Time   NA 136 09/28/2012 0530   K 4.1 09/28/2012 0530   CL 100 09/28/2012 0530   CO2 25 09/28/2012 0530   GLUCOSE 192* 09/28/2012 0530   BUN 21 09/28/2012 0530   CREATININE 1.22 09/28/2012 0530   CALCIUM 9.0 09/28/2012 0530   PROT 7.5 09/25/2012 0656   ALBUMIN 3.2* 09/25/2012 0656   AST 19 09/25/2012 0656   ALT 26 09/25/2012 0656   ALKPHOS 45 09/25/2012 0656   BILITOT 0.8 09/25/2012 0656   GFRNONAA 55* 09/28/2012 0530   GFRAA 64* 09/28/2012 0530   Lipase     Component Value Date/Time   LIPASE 110* 09/25/2012 0656       Studies/Results: Dg Cholangiogram Operative  09/27/2012  *RADIOLOGY REPORT*  Clinical Data:   Cholecystitis  INTRAOPERATIVE CHOLANGIOGRAM  Technique:  Cholangiographic images from the C-arm fluoroscopic device were submitted for interpretation post-operatively.  Please see the procedural report for the  amount of contrast and the fluoroscopy time utilized.  Comparison:  None.  Findings:  Contrast fills the biliary tree and duodenum.  There is some reflux of contrast into the pancreatic duct.  There are no filling defects in the common bile duct to suggest common duct stones.  IMPRESSION: Patent biliary tree without evidence of common bile duct stones.   Original Report Authenticated By: Jolaine Click, M.D.     Anti-infectives: Anti-infectives   Start     Dose/Rate Route Frequency Ordered Stop   09/27/12 0730  [MAR Hold]  Ampicillin-Sulbactam (UNASYN) 3 g in sodium chloride 0.9 % 100 mL IVPB     (On MAR Hold since 09/27/12 0819)  Comments:  On call to OR   3 g 100 mL/hr over 60 Minutes Intravenous On call 09/27/12 0725 09/27/12 0926   09/25/12 0130  amoxicillin-clavulanate (AUGMENTIN) 875-125 MG per tablet 1 tablet  Status:  Discontinued     1 tablet Oral Every 12 hours 09/25/12 0116 09/27/12 0725       Assessment/Plan  1. S/p lap chole  Plan: 1. Ok for [employer] home from surgical standpoint when ok with medicine.  I explained to the patient that weakness and fatigue is normal after an operation, especially for someone his  age.  I explained that it will take some time, along with his pain, before it all improves.  2. Follow up in our office already set up.  LOS: 5 days    [name] E 09/29/2012, 8:09 AM Pager: [phone]

## 2012-09-29 NOTE — Progress Notes (Signed)
Occupational Therapy Treatment Patient Details Name: [name] MRN: [id number] DOB: 1932-02-07 Today's Date: 09/29/2012 Time: 4098-1191 OT Time Calculation (min): 23 min  OT Assessment / Plan / Recommendation Comments on Treatment Session Pt with improving endurance and balance.      Follow Up Recommendations  Supervision - Intermittent;No OT follow up    Barriers to Discharge       Equipment Recommendations  None recommended by OT    Recommendations for Other Services    Frequency Min 2X/week   Plan Discharge plan remains appropriate    Precautions / Restrictions Precautions Precautions: None Restrictions Weight Bearing Restrictions: No   Pertinent Vitals/Pain     ADL  Grooming: Teeth care;Supervision/safety Where Assessed - Grooming: Supported standing Toilet Transfer: Min Pension scheme manager Method: Sit to Barista: Regular height toilet Toileting - Clothing Manipulation and Hygiene: Min guard Where Assessed - Toileting Clothing Manipulation and Hygiene: Standing Transfers/Ambulation Related to ADLs: min guard ambulating in room and in hallway.  Pt tends to furniture walk, and takes frequent short standing rest breaks which appears to be due to pain as he holds his abdomen and not dyspnea noted.  No loss of balance noted ADL Comments: Interpreter line utilized.  Pt. brushed teeth in standing at sink with supervision, no LOB, no dyspnea.  RN notified of pt c/o pain    OT Diagnosis:    OT Problem List:   OT Treatment Interventions:     OT Goals Acute Rehab OT Goals OT Goal Formulation: With patient Time For Goal Achievement: 10/05/12 Potential to Achieve Goals: Good ADL Goals Pt Will Perform Grooming: with modified independence;Standing at sink ADL Goal: Grooming - Progress: Goal set today ADL Goal: Toilet Transfer - Progress: Progressing toward goals  Visit Information  Last OT Received On: 09/29/12 Assistance Needed: +1     Subjective Data      Prior Functioning       Cognition  Cognition Overall Cognitive Status: Impaired Arousal/Alertness: Awake/alert Behavior During Session: Flat affect Cognition - Other Comments: cognition difficult to asses due to language barrier.  Pt with no awareness of his environment and no memory of his environment when ambulating in hallway.  Requires physical cues to locate room, and once room pointed out, pt continues to walk.  Pt does not follow gestural cues well    Mobility  Bed Mobility Bed Mobility: Supine to Sit;Sitting - Scoot to Edge of Bed Supine to Sit: 6: Modified independent (Device/Increase time) Sitting - Scoot to Edge of Bed: 6: Modified independent (Device/Increase time) Transfers Transfers: Sit to Stand;Stand to Sit Sit to Stand: 5: Supervision;From bed;With upper extremity assist Stand to Sit: 5: Supervision;To chair/3-in-1;With upper extremity assist    Exercises      Balance     End of Session OT - End of Session Activity Tolerance: Patient tolerated treatment well Patient left: in chair;with call bell/phone within reach Nurse Communication: Mobility status;Patient requests pain meds  GO     Deaun Rocha, Ursula Alert M 09/29/2012, 1:58 PM

## 2012-10-01 ENCOUNTER — Telehealth (INDEPENDENT_AMBULATORY_CARE_PROVIDER_SITE_OTHER): Payer: Self-pay

## 2012-10-01 ENCOUNTER — Emergency Department (HOSPITAL_COMMUNITY): Payer: Medicaid Other

## 2012-10-01 ENCOUNTER — Encounter (HOSPITAL_COMMUNITY): Payer: Self-pay | Admitting: Emergency Medicine

## 2012-10-01 ENCOUNTER — Inpatient Hospital Stay (HOSPITAL_COMMUNITY)
Admission: EM | Admit: 2012-10-01 | Discharge: 2012-10-07 | DRG: 393 | Disposition: A | Discharge status: Home / Self Care | Payer: Medicaid Other | Attending: Internal Medicine | Admitting: Internal Medicine

## 2012-10-01 DIAGNOSIS — N3944 Nocturnal enuresis: Secondary | ICD-10-CM

## 2012-10-01 DIAGNOSIS — I70219 Atherosclerosis of native arteries of extremities with intermittent claudication, unspecified extremity: Secondary | ICD-10-CM | POA: Diagnosis present

## 2012-10-01 DIAGNOSIS — T8182XA Emphysema (subcutaneous) resulting from a procedure, initial encounter: Secondary | ICD-10-CM | POA: Diagnosis present

## 2012-10-01 DIAGNOSIS — D72829 Elevated white blood cell count, unspecified: Secondary | ICD-10-CM | POA: Diagnosis present

## 2012-10-01 DIAGNOSIS — I150 Renovascular hypertension: Secondary | ICD-10-CM | POA: Diagnosis present

## 2012-10-01 DIAGNOSIS — K625 Hemorrhage of anus and rectum: Secondary | ICD-10-CM | POA: Diagnosis present

## 2012-10-01 DIAGNOSIS — IMO0002 Reserved for concepts with insufficient information to code with codable children: Secondary | ICD-10-CM | POA: Diagnosis present

## 2012-10-01 DIAGNOSIS — L259 Unspecified contact dermatitis, unspecified cause: Secondary | ICD-10-CM

## 2012-10-01 DIAGNOSIS — J329 Chronic sinusitis, unspecified: Secondary | ICD-10-CM

## 2012-10-01 DIAGNOSIS — N179 Acute kidney failure, unspecified: Secondary | ICD-10-CM | POA: Diagnosis present

## 2012-10-01 DIAGNOSIS — I7389 Other specified peripheral vascular diseases: Secondary | ICD-10-CM | POA: Diagnosis present

## 2012-10-01 DIAGNOSIS — I4891 Unspecified atrial fibrillation: Secondary | ICD-10-CM | POA: Diagnosis present

## 2012-10-01 DIAGNOSIS — K7460 Unspecified cirrhosis of liver: Secondary | ICD-10-CM | POA: Diagnosis present

## 2012-10-01 DIAGNOSIS — E119 Type 2 diabetes mellitus without complications: Secondary | ICD-10-CM

## 2012-10-01 DIAGNOSIS — R197 Diarrhea, unspecified: Secondary | ICD-10-CM | POA: Diagnosis present

## 2012-10-01 DIAGNOSIS — R112 Nausea with vomiting, unspecified: Secondary | ICD-10-CM | POA: Diagnosis present

## 2012-10-01 DIAGNOSIS — I219 Acute myocardial infarction, unspecified: Secondary | ICD-10-CM

## 2012-10-01 DIAGNOSIS — Z951 Presence of aortocoronary bypass graft: Secondary | ICD-10-CM

## 2012-10-01 DIAGNOSIS — Y836 Removal of other organ (partial) (total) as the cause of abnormal reaction of the patient, or of later complication, without mention of misadventure at the time of the procedure: Secondary | ICD-10-CM | POA: Diagnosis present

## 2012-10-01 DIAGNOSIS — R2681 Unsteadiness on feet: Secondary | ICD-10-CM

## 2012-10-01 DIAGNOSIS — K559 Vascular disorder of intestine, unspecified: Secondary | ICD-10-CM | POA: Diagnosis present

## 2012-10-01 DIAGNOSIS — IMO0001 Reserved for inherently not codable concepts without codable children: Secondary | ICD-10-CM | POA: Diagnosis present

## 2012-10-01 DIAGNOSIS — K55059 Acute (reversible) ischemia of intestine, part and extent unspecified: Principal | ICD-10-CM | POA: Diagnosis present

## 2012-10-01 DIAGNOSIS — Z87891 Personal history of nicotine dependence: Secondary | ICD-10-CM

## 2012-10-01 DIAGNOSIS — R079 Chest pain, unspecified: Secondary | ICD-10-CM

## 2012-10-01 DIAGNOSIS — E860 Dehydration: Secondary | ICD-10-CM | POA: Diagnosis present

## 2012-10-01 DIAGNOSIS — K668 Other specified disorders of peritoneum: Secondary | ICD-10-CM | POA: Diagnosis present

## 2012-10-01 DIAGNOSIS — J690 Pneumonitis due to inhalation of food and vomit: Secondary | ICD-10-CM | POA: Diagnosis present

## 2012-10-01 DIAGNOSIS — E1165 Type 2 diabetes mellitus with hyperglycemia: Secondary | ICD-10-CM

## 2012-10-01 DIAGNOSIS — Z9089 Acquired absence of other organs: Secondary | ICD-10-CM

## 2012-10-01 DIAGNOSIS — Z79899 Other long term (current) drug therapy: Secondary | ICD-10-CM

## 2012-10-01 DIAGNOSIS — F411 Generalized anxiety disorder: Secondary | ICD-10-CM | POA: Diagnosis present

## 2012-10-01 DIAGNOSIS — Z7982 Long term (current) use of aspirin: Secondary | ICD-10-CM

## 2012-10-01 DIAGNOSIS — K529 Noninfective gastroenteritis and colitis, unspecified: Secondary | ICD-10-CM | POA: Diagnosis present

## 2012-10-01 DIAGNOSIS — A09 Infectious gastroenteritis and colitis, unspecified: Secondary | ICD-10-CM | POA: Diagnosis present

## 2012-10-01 DIAGNOSIS — I2510 Atherosclerotic heart disease of native coronary artery without angina pectoris: Secondary | ICD-10-CM | POA: Diagnosis present

## 2012-10-01 DIAGNOSIS — R1084 Generalized abdominal pain: Secondary | ICD-10-CM

## 2012-10-01 DIAGNOSIS — R933 Abnormal findings on diagnostic imaging of other parts of digestive tract: Secondary | ICD-10-CM

## 2012-10-01 DIAGNOSIS — I10 Essential (primary) hypertension: Secondary | ICD-10-CM

## 2012-10-01 DIAGNOSIS — Z7901 Long term (current) use of anticoagulants: Secondary | ICD-10-CM

## 2012-10-01 DIAGNOSIS — K8020 Calculus of gallbladder without cholecystitis without obstruction: Secondary | ICD-10-CM

## 2012-10-01 LAB — CBC WITH DIFFERENTIAL/PLATELET
Basophils Relative: 0 % (ref 0–1)
Basophils Relative: 0 % (ref 0–1)
Eosinophils Absolute: 0 10*3/uL (ref 0.0–0.7)
Eosinophils Absolute: 0.1 10*3/uL (ref 0.0–0.7)
Eosinophils Relative: 0 % (ref 0–5)
Eosinophils Relative: 0 % (ref 0–5)
Hemoglobin: 13.3 g/dL (ref 13.0–17.0)
Lymphs Abs: 0.6 10*3/uL — ABNORMAL LOW (ref 0.7–4.0)
Lymphs Abs: 0.8 10*3/uL (ref 0.7–4.0)
MCH: 30.3 pg (ref 26.0–34.0)
MCH: 30 pg (ref 26.0–34.0)
MCHC: 35.3 g/dL (ref 30.0–36.0)
MCHC: 34.8 g/dL (ref 30.0–36.0)
MCV: 85.9 fL (ref 78.0–100.0)
MCV: 86.1 fL (ref 78.0–100.0)
Monocytes Relative: 7 % (ref 3–12)
Neutrophils Relative %: 90 % — ABNORMAL HIGH (ref 43–77)
Neutrophils Relative %: 89 % — ABNORMAL HIGH (ref 43–77)
Platelets: 210 10*3/uL (ref 150–400)
Platelets: 222 10*3/uL (ref 150–400)
RBC: 4.39 MIL/uL (ref 4.22–5.81)
RBC: 4.47 MIL/uL (ref 4.22–5.81)
RDW: 14.8 % (ref 11.5–15.5)

## 2012-10-01 LAB — COMPREHENSIVE METABOLIC PANEL
Albumin: 3.1 g/dL — ABNORMAL LOW (ref 3.5–5.2)
Alkaline Phosphatase: 53 U/L (ref 39–117)
BUN: 34 mg/dL — ABNORMAL HIGH (ref 6–23)
Calcium: 9.6 mg/dL (ref 8.4–10.5)
GFR calc Af Amer: 54 mL/min — ABNORMAL LOW (ref >90)
Glucose, Bld: 310 mg/dL — ABNORMAL HIGH (ref 70–99)
Potassium: 4 mEq/L (ref 3.5–5.1)
Total Protein: 7.2 g/dL (ref 6.0–8.3)

## 2012-10-01 LAB — OCCULT BLOOD, POC DEVICE: Fecal Occult Bld: POSITIVE — AB

## 2012-10-01 LAB — PROTIME-INR
INR: 1.88 — ABNORMAL HIGH (ref 0.00–1.49)
Prothrombin Time: 20.9 seconds — ABNORMAL HIGH (ref 11.6–15.2)

## 2012-10-01 LAB — POCT I-STAT TROPONIN I: Troponin i, poc: 0.02 ng/mL (ref 0.00–0.08)

## 2012-10-01 LAB — DIGOXIN LEVEL: Digoxin Level: 0.6 ng/mL — ABNORMAL LOW (ref 0.8–2.0)

## 2012-10-01 NOTE — ED Provider Notes (Signed)
History    Level V caveat do to language barrier CSN: [id number]  Arrival date & time 10/01/12  1815   First MD Initiated Contact with Patient 10/01/12 1936      Chief Complaint  Patient presents with  . Rectal Bleeding  . Abdominal Pain    (Consider location/radiation/quality/duration/timing/severity/associated sxs/prior treatment) HPI 79 year old male recently discharged 2 days prior to this evaluation after a cholecystectomy who presents today with the stool. Patient lives with his daughter and is giving history. Patient is [place] does not speak any [place]. She states that he had been home and doing okay since the surgery taking by mouth on Sunday and began having loose bowel movements Sunday afternoon. He did have loose bowel movements today and she checked after him and noticed that there was bright red blood. He is on Coumadin for chronic atrial fibrillation. She denies that he has had any abdominal pain. He has been nauseated but has not vomited today. He had been taking clear liquids earlier today.   Past Medical History  Diagnosis Date  . Permanent atrial fibrillation   . Hypertension   . HNP (herniated nucleus pulposus), lumbar   . PVD (peripheral vascular disease)   . Diabetes mellitus   . CAD (coronary artery disease)     s/p CABG x 4  . Ventricular hypertrophy     Past Surgical History  Procedure Laterality Date  . Cardiac catheterization  01/2002    40-50% distal LM/ostial LAD disease, 50-60% ostial LAD, 60% mid LAD, 30-40% D1, 70% tubular mid LCx, 70% ostial disease in two ramus intermedius branches, 70% prox RCA, 50-60% mid RCA, 70% distal RCA, LVEF 40%   . Coronary artery bypass graft  01/2002    x 4: LIMA-LAD, SVG-OM, SVG-PDA-PLB  . Cholecystectomy N/A 09/27/2012    Procedure: LAPAROSCOPIC CHOLECYSTECTOMY WITH INTRAOPERATIVE CHOLANGIOGRAM;  Surgeon: Lodema Pilot, DO;  Location: [facility] OR;  Service: General;  Laterality: N/A;    Family History  Problem  Relation Age of Onset  . Heart disease Mother   . Heart disease Sister   . Breast cancer Sister     History  Substance Use Topics  . Smoking status: Former Smoker -- 1.00 packs/day for 30 years    Types: Cigarettes    Quit date: 02/15/1991  . Smokeless tobacco: Not on file  . Alcohol Use: No      Review of Systems  Unable to perform ROS   Allergies  Percocet  Home Medications   Current Outpatient Rx  Name  Route  Sig  Dispense  Refill  . ALPRAZolam (XANAX) 0.5 MG tablet   Oral   Take 1 tablet (0.5 mg total) by mouth 2 (two) times daily as needed for anxiety.   20 tablet   0   . aspirin EC 81 MG EC tablet   Oral   Take 1 tablet (81 mg total) by mouth daily.   30 tablet   0   . atorvastatin (LIPITOR) 20 MG tablet   Oral   Take 20 mg by mouth daily.         . digoxin (LANOXIN) 0.25 MG tablet   Oral   Take 1 tablet (250 mcg total) by mouth daily.   30 tablet   0   . enalapril (VASOTEC) 5 MG tablet   Oral   Take 1 tablet (5 mg total) by mouth daily.   30 tablet   0   . glyBURIDE (DIABETA) 5 MG tablet   Oral  Take 1 tablet (5 mg total) by mouth daily with breakfast.   30 tablet   0   . HYDROcodone-acetaminophen (NORCO/VICODIN) 5-325 MG per tablet   Oral   Take 1 tablet by mouth every 4 (four) hours as needed for pain.         . metFORMIN (GLUCOPHAGE) 500 MG tablet   Oral   Take 1 tablet (500 mg total) by mouth 2 (two) times daily with a meal.   60 tablet   0   . metoprolol succinate (TOPROL-XL) 50 MG 24 hr tablet   Oral   Take 1 tablet (50 mg total) by mouth 2 (two) times daily. Take with or immediately following a meal.   60 tablet   0   . warfarin (COUMADIN) 7.5 MG tablet   Oral   Take 7.5 mg by mouth every evening. At 6pm         . glucose blood (ACCU-CHEK ADVANTAGE TEST) test strip      Use as instructed   100 each   12     BP 179/108  Pulse 77  Temp(Src) 99 F (37.2 C) (Rectal)  Resp 18  SpO2 96%  Physical Exam   Vitals reviewed. Constitutional: He appears well-developed and well-nourished.  Icteric  HENT:  Head: Normocephalic and atraumatic.  Right Ear: External ear normal.  Left Ear: External ear normal.  Mucous membranes are dry  Eyes: Pupils are equal, round, and reactive to light.  Neck: Normal range of motion. Neck supple.  Cardiovascular: An irregularly irregular rhythm present.  Pulmonary/Chest: Effort normal and breath sounds normal.  Abdominal: He exhibits no distension and no mass. There is no rebound and no guarding.  Healing scars with some contusion mild diffuse tenderness no rebound noted  Musculoskeletal: Normal range of motion.  Neurological: He is alert.  Skin: Skin is warm and dry.    ED Course  Procedures (including critical care time)  Labs Reviewed  CBC WITH DIFFERENTIAL - Abnormal; Notable for the following:    WBC 23.2 (*)    HCT 37.7 (*)    Neutrophils Relative 90 (*)    Neutro Abs 20.9 (*)    Lymphocytes Relative 3 (*)    Lymphs Abs 0.6 (*)    Monocytes Absolute 1.6 (*)    All other components within normal limits  COMPREHENSIVE METABOLIC PANEL - Abnormal; Notable for the following:    Glucose, Bld 310 (*)    BUN 34 (*)    Creatinine, Ser 1.40 (*)    Albumin 3.1 (*)    GFR calc non Af Amer 47 (*)    GFR calc Af Amer 54 (*)    All other components within normal limits  PROTIME-INR - Abnormal; Notable for the following:    Prothrombin Time 20.9 (*)    INR 1.88 (*)    All other components within normal limits  DIGOXIN LEVEL - Abnormal; Notable for the following:    Digoxin Level 0.6 (*)    All other components within normal limits  OCCULT BLOOD, POC DEVICE - Abnormal; Notable for the following:    Fecal Occult Bld POSITIVE (*)    All other components within normal limits  CBC WITH DIFFERENTIAL  POCT I-STAT TROPONIN I   Dg Chest 2 View  10/01/2012  *RADIOLOGY REPORT*  Clinical Data: Rectal bleeding.  Abdominal pain.  Cholecystectomy on 09/27/2012   CHEST - 2 VIEW  Comparison: Chest x-Chiron Campione dated 09/24/2012  Findings: There is free air under the  diaphragm, probably secondary to the recent cholecystectomy.  There are no acute infiltrates or effusions.  Chronic accentuation of the interstitial markings particularly at the bases.  Prior CABG.  No acute osseous abnormality.  IMPRESSION: No acute disease in the chest.  Free air under the diaphragm consistent with recent abdominal surgery.   Original Report Authenticated By: Francene Boyers, M.D.      No diagnosis found.     this discharge summary is a stiff from discharge summary of 09/29/2012 Physician Discharge Summary   Rayan Ines WUJ:[id number] DOB: September 19, 1931 DOA: 09/24/2012  PCP: Pcp Not In System  Admit date: 09/24/2012 Discharge date: 09/29/2012  Time spent: 40  minutes  Recommendations for Outpatient Follow-up:  Home with outpt follow up   Needs INR checked during outpatient visit   Discharge Diagnoses:   Principal Problem:   Cholelithiases  Active Problems:   CAD (coronary artery disease) s/pCABG   HYPERTENSION, BENIGN   FIBRILLATION, ATRIAL   DIABETES MELLITUS   Chest pain   Sinusitis   Unsteady gait   Non compliance anxiety   Discharge Condition: fair  Diet recommendation: diabetic    Filed Weights     09/26/12 0616  09/27/12 0330  09/29/12 0420   Weight:  66.7 kg (147 lb 0.8 oz)  66.5 kg (146 lb 9.7 oz)  67.8 kg (149 lb 7.6 oz)     History of present illness:   79 y.o. male with a past medical history of Atrial fibrillation; Hypotension; HNP (herniated nucleus pulposus), lumbar; PVD (peripheral vascular disease); Diabetes mellitus; CAD (coronary artery disease); and Ventricular hypertrophy. Patient moved here from [place] since 2006 he has been non-compliant with his medications and his daughter is worried that he might be depressed. While visiting to [place] he had a suicidal attempt in 2013. He was diagnosed with situational depression but he refuses to take his  medication. He has been crying out at night. Patient is straight about dying he is constantly feeling his pulse to see if his heart was still beating. Patient has been complaining about epigastric pain that has been there for the past 2-3 years but it seems to get worse recently. His lipase was slightly elevated to 125 and in emergency department US showed cholelithiasis but no biliary dilatation. No nausea or vomiting no liver function abnormalities. He reports some dizziness and sensation that he is unsteady on his feet this has been going on for few years but now it is worse. He presented to the ED with shortness of breath and chest discomfort and has been worried that he will die. His daughter has taken over his medications because she is concerned he may be overmedicating himself. Patient has history of coronary artery disease and while visiting to [place] in 2004 have had a quadruple bypass.   Patient admitted to medical floor and ultrasound of the abdomen showed cholelithiasis.   Hospital Course:    Epigastric pain with cholelithiasis   - surgery consulted she had laparoscopic cholecystectomy done on 3/20. Tolerated procedure well. Has been tolerating advanced diet and pain improving. - cardiology consulted for peri-op management. Underwent nuclear stress test which was negative for ischemia.   Chest pain  -Appear to be epigastric. 2-D echo unremarkable. negative nuclear stress test     MDM   Date: 10/01/2012  Rate: 128  Rhythm: atrial fibrillation  QRS Axis: normal  Intervals: a fib  ST/T Wave abnormalities: nonspecific ST/T changes  Conduction Disutrbances:right bundle branch block  Narrative Interpretation:  Old EKG Reviewed: rate has increased by 38 bets/min     Results for orders placed during the hospital encounter of 10/01/12  CBC WITH DIFFERENTIAL      Result Value Range   WBC 23.2 (*) 4.0 - 10.5 K/uL   RBC 4.39  4.22 - 5.81 MIL/uL   Hemoglobin 13.3  13.0 -  17.0 g/dL   HCT 69.6 (*) 29.5 - 28.4 %   MCV 85.9  78.0 - 100.0 fL   MCH 30.3  26.0 - 34.0 pg   MCHC 35.3  30.0 - 36.0 g/dL   RDW 13.2  44.0 - 10.2 %   Platelets 210  150 - 400 K/uL   Neutrophils Relative 90 (*) 43 - 77 %   Neutro Abs 20.9 (*) 1.7 - 7.7 K/uL   Lymphocytes Relative 3 (*) 12 - 46 %   Lymphs Abs 0.6 (*) 0.7 - 4.0 K/uL   Monocytes Relative 7  3 - 12 %   Monocytes Absolute 1.6 (*) 0.1 - 1.0 K/uL   Eosinophils Relative 0  0 - 5 %   Eosinophils Absolute 0.0  0.0 - 0.7 K/uL   Basophils Relative 0  0 - 1 %   Basophils Absolute 0.1  0.0 - 0.1 K/uL  COMPREHENSIVE METABOLIC PANEL      Result Value Range   Sodium 135  135 - 145 mEq/L   Potassium 4.0  3.5 - 5.1 mEq/L   Chloride 98  96 - 112 mEq/L   CO2 22  19 - 32 mEq/L   Glucose, Bld 310 (*) 70 - 99 mg/dL   BUN 34 (*) 6 - 23 mg/dL   Creatinine, Ser 7.25 (*) 0.50 - 1.35 mg/dL   Calcium 9.6  8.4 - 36.6 mg/dL   Total Protein 7.2  6.0 - 8.3 g/dL   Albumin 3.1 (*) 3.5 - 5.2 g/dL   AST 23  0 - 37 U/L   ALT 35  0 - 53 U/L   Alkaline Phosphatase 53  39 - 117 U/L   Total Bilirubin 0.6  0.3 - 1.2 mg/dL   GFR calc non Af Amer 47 (*) >90 mL/min   GFR calc Af Amer 54 (*) >90 mL/min  PROTIME-INR      Result Value Range   Prothrombin Time 20.9 (*) 11.6 - 15.2 seconds   INR 1.88 (*) 0.00 - 1.49  DIGOXIN LEVEL      Result Value Range   Digoxin Level 0.6 (*) 0.8 - 2.0 ng/mL  OCCULT BLOOD, POC DEVICE      Result Value Range   Fecal Occult Bld POSITIVE (*) NEGATIVE  POCT I-STAT TROPONIN I      Result Value Range   Troponin i, poc 0.02  0.00 - 0.08 ng/mL   Comment 3            Grossly bloody stool on rectal  1-rectal bleeding- patient with anticoagulation for a fib, rectal bleeding with loose stool, and elevated wbc after recent hospitalization and cholecystectomy.  c-dif sent.  2- hyperglycemia  3-atrial fibrillation with rate elevated but decreased here with rest.  Fluid being given.   Patient care discussed with Dr. Eben Burow  and will admit to tele bed.   Hilario Quarry, MD 10/01/12 2203

## 2012-10-01 NOTE — Telephone Encounter (Signed)
The patient's daughter called and reports he is having frequent diarrhea about every 15 minutes.  It now has blood in the toilet when he goes.  It is not just when he wipes.  He is also on Coumadin and Aspirin.  I paged Dr Biagio Quint.   He said he could have Cdiff because that frequent of bowel movements is no normal.  He can't say over the phone about the bleeding though and the best thing would be to go to the er and maybe get a blood count check done.  I called and notified the pt's daughter.

## 2012-10-01 NOTE — ED Notes (Signed)
Gallbladder removed last week and discharged 2 days ago.  Daughter at bedside states patient general abdominal pain with multiple episodes of diarrhea yesterday and today.  Stool loose bright red. Taking Vicodin every 4-5 hours for abdominal pain.

## 2012-10-01 NOTE — Telephone Encounter (Signed)
Daughter called again to state that patient is continuing to have diarrhea.  Again encourage patient to drink plenty of fluids and eat a bland diet while the body adjusts.  Daughter states understanding at this time.  Daughter also reports that patient is have irritation with some bleeding from his bottom with the diarrhea.

## 2012-10-01 NOTE — H&P (Signed)
History and Physical  [name] ZOX:[id number] DOB: Nov 12, 1931 DOA: 10/01/2012  Referring physician: ER PCP: Pcp Not In System   Chief Complaint: Rectal bleeding, loose bowel movements and abdominal pain  HPI: 79 year old male with past medical history most significant for atrial fibrillation on anticoagulation therapy, hypertension, diabetes, coronary artery disease status post CABG and peripheral vascular disease who was recently discharged 2 days ago after a cholecystectomy at [facility] presents today with the loose stools mixed with blood and abdominal pain. Patient lives with his daughter who is giving history. Patient is [place] does not speak any [place]. She states that he had been home and doing okay since the surgery. He was eating and drinking well but on Sunday that is 1 day prior to admission began having loose bowel movements. Every 20 minutes 8-10 episodes which contained bright red blood. He is on Coumadin for chronic atrial fibrillation. He has also had abdominal pain which is diffuse and started on Sunday.  He has been nauseated but has vomited twice since morning. He has been taking clear liquids earlier today.  Initial evaluation in the ER revealed vitals consistent with A. fib with RVR, a WBC count of 23,000, and INR of 1.8 and hemoglobin of 13 with gross blood noted on physical examination. ER physician asked this patient to be admitted for observation and further evaluation.  Review of Systems:  15 point review of system is negative except what is noted above  Past Medical History  Diagnosis Date  . Permanent atrial fibrillation   . Hypertension   . HNP (herniated nucleus pulposus), lumbar   . PVD (peripheral vascular disease)   . Diabetes mellitus   . CAD (coronary artery disease)     s/p CABG x 4  . Ventricular hypertrophy     Past Surgical History  Procedure Laterality Date  . Cardiac catheterization  01/2002    40-50% distal LM/ostial LAD disease,  50-60% ostial LAD, 60% mid LAD, 30-40% D1, 70% tubular mid LCx, 70% ostial disease in two ramus intermedius branches, 70% prox RCA, 50-60% mid RCA, 70% distal RCA, LVEF 40%   . Coronary artery bypass graft  01/2002    x 4: LIMA-LAD, SVG-OM, SVG-PDA-PLB  . Cholecystectomy N/A 09/27/2012    Procedure: LAPAROSCOPIC CHOLECYSTECTOMY WITH INTRAOPERATIVE CHOLANGIOGRAM;  Surgeon: Lodema Pilot, DO;  Location: [facility] OR;  Service: General;  Laterality: N/A;    Social History:  reports that he quit smoking about 21 years ago. His smoking use included Cigarettes. He has a 30 pack-year smoking history. He does not have any smokeless tobacco history on file. He reports that he does not drink alcohol or use illicit drugs.  Allergies  Allergen Reactions  . Percocet (Oxycodone-Acetaminophen) Other (See Comments)    "almost a heart attack"    Family History  Problem Relation Age of Onset  . Heart disease Mother   . Heart disease Sister   . Breast cancer Sister      Prior to Admission medications   Medication Sig Start Date End Date Taking? Authorizing Provider  ALPRAZolam Prudy Feeler) 0.5 MG tablet Take 1 tablet (0.5 mg total) by mouth 2 (two) times daily as needed for anxiety. 09/29/12  Yes Nishant Dhungel, MD  aspirin EC 81 MG EC tablet Take 1 tablet (81 mg total) by mouth daily. 09/29/12  Yes Nishant Dhungel, MD  atorvastatin (LIPITOR) 20 MG tablet Take 20 mg by mouth daily.   Yes Historical Provider, MD  digoxin (LANOXIN) 0.25 MG tablet Take  1 tablet (250 mcg total) by mouth daily. 09/29/12  Yes Nishant Dhungel, MD  enalapril (VASOTEC) 5 MG tablet Take 1 tablet (5 mg total) by mouth daily. 09/29/12  Yes Nishant Dhungel, MD  glyBURIDE (DIABETA) 5 MG tablet Take 1 tablet (5 mg total) by mouth daily with breakfast. 09/29/12  Yes Nishant Dhungel, MD  HYDROcodone-acetaminophen (NORCO/VICODIN) 5-325 MG per tablet Take 1 tablet by mouth every 4 (four) hours as needed for pain.   Yes Historical Provider, MD  metFORMIN  (GLUCOPHAGE) 500 MG tablet Take 1 tablet (500 mg total) by mouth 2 (two) times daily with a meal. 09/29/12  Yes Nishant Dhungel, MD  metoprolol succinate (TOPROL-XL) 50 MG 24 hr tablet Take 1 tablet (50 mg total) by mouth 2 (two) times daily. Take with or immediately following a meal. 09/29/12  Yes Nishant Dhungel, MD  warfarin (COUMADIN) 7.5 MG tablet Take 7.5 mg by mouth every evening. At 6pm   Yes Historical Provider, MD  glucose blood (ACCU-CHEK ADVANTAGE TEST) test strip Use as instructed 09/29/12   Eddie North, MD   Physical Exam: Filed Vitals:   10/01/12 1828 10/01/12 2034 10/01/12 2143  BP: 179/108  173/117  Pulse: 77  112  Temp: 98.6 F (37 C) 99 F (37.2 C)   TempSrc: Oral Rectal   Resp: 18  26  SpO2: 96%  97%   Constitutional: He appears well-developed and well-nourished. Icteric  HENT: Head: Normocephalic and atraumatic. Right Ear: External ear normal. Left Ear: External ear normal.  Mucous membranes are dry  Eyes: Pupils are equal, round, and reactive to light.  Neck: Normal range of motion. Neck supple.  Cardiovascular: An irregularly irregular rhythm present. No murmur, rubs, or gallops noted Pulmonary/Chest: Effort normal and breath sounds normal.  Abdominal: He exhibits no distension and no mass. There is no rebound and no guarding.  Healing scars with some contusion mild diffuse tenderness no rebound noted  Musculoskeletal: Normal range of motion.  Neurological: He is alert.  Skin: Skin is warm and dry.   Wt Readings from Last 3 Encounters:  09/29/12 149 lb 7.6 oz (67.8 kg)  09/29/12 149 lb 7.6 oz (67.8 kg)  09/29/12 149 lb 7.6 oz (67.8 kg)    Labs on Admission:  Basic Metabolic Panel:  Recent Labs Lab 09/25/12 0656 09/26/12 0545 09/28/12 0530 09/28/12 0930 10/01/12 2003  NA 137 133* 136  --  135  K 4.3 4.3 4.1  --  4.0  CL 101 99 100  --  98  CO2 27 23 25   --  22  GLUCOSE 207* 217* 192*  --  310*  BUN 25* 20 21  --  34*  CREATININE 1.07 1.05  1.22  --  1.40*  CALCIUM 9.2 9.1 9.0  --  9.6  MG 2.0  --   --  1.7  --   PHOS 3.0  --   --   --   --     Liver Function Tests:  Recent Labs Lab 09/25/12 0656 10/01/12 2003  AST 19 23  ALT 26 35  ALKPHOS 45 53  BILITOT 0.8 0.6  PROT 7.5 7.2  ALBUMIN 3.2* 3.1*    Recent Labs Lab 09/25/12 0656  LIPASE 110*   CBC:  Recent Labs Lab 09/25/12 0656 09/26/12 0545 09/28/12 0530 10/01/12 2003  WBC 6.4 6.2 7.5 23.2*  NEUTROABS  --   --   --  20.9*  HGB 13.7 13.7 12.1* 13.3  HCT 41.1 40.2 36.3* 37.7*  MCV 85.8 86.1 87.3 85.9  PLT 246 223 184 210    Cardiac Enzymes:  Recent Labs Lab 09/25/12 0121 09/25/12 0656 09/25/12 1309  TROPONINI <0.30 <0.30 <0.30    Troponin (Point of Care Test)  Recent Labs  10/01/12 2023  TROPIPOC 0.02    CBG:  Recent Labs Lab 09/28/12 1616 09/28/12 2100 09/29/12 0607 09/29/12 1109 09/29/12 1618  GLUCAP 201* 196* 164* 203* 77     Radiological Exams on Admission: Dg Chest 2 View  10/01/2012  *RADIOLOGY REPORT*  Clinical Data: Rectal bleeding.  Abdominal pain.  Cholecystectomy on 09/27/2012  CHEST - 2 VIEW  Comparison: Chest x-ray dated 09/24/2012  Findings: There is free air under the diaphragm, probably secondary to the recent cholecystectomy.  There are no acute infiltrates or effusions.  Chronic accentuation of the interstitial markings particularly at the bases.  Prior CABG.  No acute osseous abnormality.  IMPRESSION: No acute disease in the chest.  Free air under the diaphragm consistent with recent abdominal surgery.   Original Report Authenticated By: Francene Boyers, M.D.     EKG: Independently reviewed. 128 beats per minute, irregularly irregular rhythm, T wave inversions noted in the inferior leads which are new as compared to the EKG on March 18.   Principal Problem:   Rectal bleeding Active Problems:   DIABETES MELLITUS   HYPERTENSION, BENIGN   FIBRILLATION, ATRIAL   PVD WITH CLAUDICATION   CAD (coronary  artery disease)   Assessment/Plan Patient is a 79 year old man with past medical history most significant for diabetes, hypertension, coronary artery disease status post CABG, peripheral vascular disease with claudication and atrial fibrillation on Coumadin who was recently discharged from the hospital after a successful laparoscopic cholecystectomy 5 days ago comes back with chief complaints of loose bowel movements mixed with blood.  Rectal bleeding/loose bowel movements:  Patient does not have a history of hemorrhoids. With painless rectal bleeding the most likely differential includes diverticulosis. Other differentials could be AV malformations, upper GI bleed, colon cancer and infectious colitis like c diff colitis. -I will hold Coumadin and aspirin for now -No record of previous colonoscopy, hemoglobin is 13 at this time, may consult GI inpatient if hemoglobin drops further otherwise patient would need outpatient colonoscopy -CT scan of the abdomen with oral contrast to rule out diverticulosis/diverticulitis especially with such elevated white count  -Check CBCs every 12 hours -c diff PCR -start presumptive treatment for c diff colitis with oral metronidazole 500 mg TID (d/c if pcr is negative) -Transfuse if CBC is below 8(patient has coronary artery disease and peripheral vascular disease) -clear liquid diet for now and advance as tolerated -IVF 100 cc/hr NS -Consider GI consult if patient not better or starts dropping hbg  Diabetes:  -Continue sliding scale insulin -hold oral hypoglycemics  Hypertension: Continue home antihypertensives  Atrial fibrillation: Hold anticoagulation for now -Continue rate control medications -check digoxin levels  Rest of the medical management to be continued as home.  SCDs for DVT prophylaxis.  Code Status: Full code Family Communication: Updated at bedside Disposition Plan/Anticipated LOS: Guarded  Time spent: 75 minutes  Lars Mage,  MD  [facility] Team 5  If 7PM-7AM, please contact night-coverage at [email or web address], password [facility] 10/01/2012, 10:22 PM

## 2012-10-01 NOTE — ED Notes (Signed)
MD at bedside. 

## 2012-10-01 NOTE — Op Note (Signed)
NAMEANTWON, ROCHIN             ACCOUNT NO.:  [id number]  MEDICAL RECORD NO.:  [id number]  LOCATION:                                 FACILITY:  PHYSICIAN:  Lodema Pilot, MD       DATE OF BIRTH:  06-17-1932  DATE OF PROCEDURE:  09/27/2012 DATE OF DISCHARGE:                              OPERATIVE REPORT   PROCEDURE:  Laparoscopic cholecystectomy and intraoperative cholangiogram.  PREOPERATIVE DIAGNOSIS:  Gallstone pancreatitis and abdominal pain.  POSTOPERATIVE DIAGNOSIS:  Gallstone pancreatitis and abdominal pain.  SURGEON:  Lodema Pilot, MD  ASSISTANT:  Dr. Vincenza Hews.  ANESTHESIA:  General endotracheal anesthesia with 20 mL of 1% lidocaine with epinephrine and 0.25% Marcaine in a 50:50 mixture.  FLUIDS:  1800 mL crystalloid.  ESTIMATED BLOOD LOSS:  Minimal.  DRAINS:  None.  SPECIMENS:  Gallbladder and contents sent to Pathology for permanent section.  COMPLICATIONS:  None.  FINDINGS:  Small contracted gallbladder was completely filled with a single large stone made it difficult to grab.  Cholangiogram was performed without any evidence of retained common bile duct stones.  OPERATIVE DETAILS:  Mr. Maysonet was seen and evaluated in the surgical ward and risks and benefits of the procedure were discussed with the patient through his daughter acting as Nurse, learning disability as well as through the language liner earlier this morning.  He was given prophylactic antibiotics and taken to the operating room, placed on table in supine position.  General endotracheal anesthesia was obtained and Foley catheter was placed.  His abdomen was prepped and draped in a standard surgical fashion.  Procedure time-out was performed with all operative team members to confirm proper patient, procedure, and the supraumbilical midline incision was made in the skin and dissection was carried down to the abdominal wall fascia using blunt dissection.  The fascia was elevated and sharply incised.  The  peritoneum was entered bluntly.  A 12 mm balloon port was placed and pneumoperitoneum was obtained.  Laparoscope was introduced and there was no evidence of bleeding or bowel injury upon entry, and he had some right upper quadrant adhesions and I placed a 5 mm right lateral abdominal trocar under direct visualization and then using sharp dissection took down the adhesions from the abdominal wall.  No cautery was used for this.  He also had some adhesion from the liver to the abdominal wall, which were also taken down sharply.  I attempted to grab the gallbladder and retracted cephalad, but it was so hard and unable to be grabbed.  I placed a Prolene suture through the abdominal wall and laparoscopically I placed this through the dome of the gallbladder and exited up through the abdominal wall again to get some upward retraction on the gallbladder.  This allowed me to take down some adhesions from the gallbladder using blunt and sharp dissection.  I could not really grab the gallbladder at all and just had to roll it from side to side in order to get visualization.  I took down the peritoneum using blunt dissection, I was able to identify the cystic artery, which was coursing onto the gallbladder and this was divided between hemoclips.  Again, I could not really visualize  any obvious cystic duct and so I dissected the gallbladder.  I was able to free this up from the dome down technique.  I mobilized the gallbladder from the gallbladder fossa.  I had this suspended on a single pedicle and I skeletonized this even further until it appeared to be on a single cystic duct.  However, the cystic duct was fairly wide and I was concerned that this could be just tenting up the common duct as well.  Dr. Toni Amend and I decided to perform cholangiogram high up on the gallbladder and cholangiogram catheter was passed through the abdominal wall into a small cystic ductotomy.  I was able to feel the  catheter with a Reddick catheter and a cholangiogram was performed, which demonstrated that this was indeed the cystic duct.  There was no evidence of retained common bile duct stones and normal right and left hepatic ducts and free flow of bile into the duodenum.  The catheter was removed and the duct was clipped with 2 hemoclips and divided, and the gallbladder had already been completely divided from the gallbladder fossa and was suspended with a suture and was placed in EndoCatch bag and removed from the umbilical trocar site.  After extending the fascia in order to remove the large stone and remove the gallbladder and felt like it was resolved.  The entire thing was calcified solid with a single large gallstone and was sent to Pathology for permanent section.  The fascia was then approximated with interrupted 0 Vicryl sutures in open fashion.  The sutures were secured and the abdomen was re-insufflated with carbon dioxide gas.  The abdominal wall closure was noted be adequate without any evidence of bleeding or bowel injury.  The right upper quadrant was inspected again for hemostasis, which was noted be adequate.  There was no evidence of bowel injury or bleeding or bowel, and the final trocars were removed under direct visualization.  Abdominal wall was noted to be hemostatic.  Skin edges were injected with 20 mL of 1% lidocaine with epinephrine and 0.5% Marcaine in a 50:50 mixture and the skin edge were approximated with 4-0 Monocryl and subcuticular suture.  The skin was washed and dried and Dermabond was applied.  All sponge, needle, and instrument counts were correct in the case.  The patient tolerated the procedure well without apparent complications.          ______________________________ Lodema Pilot, MD     BL/MEDQ  D:  09/27/2012  T:  09/28/2012  Job:  [id number]

## 2012-10-01 NOTE — Telephone Encounter (Signed)
The daughter called in regarding her dad having gallbladder removal.  He started having diarrhea yesterday all afternoon.  She is worried about keeping him hydrated.  I asked her to encourage more liquids such as water, juice, etc.  She is unsure if he has fever.  I asked what he has eaten.  She fixed him fried chicken, vegetables and mashed potatoes.  I discouraged fried foods right now.  I said he should stay away from fried, fatty, and greasy foods.  I said it will take his body several weeks to adjust to not having a gallbladder and he should watch what he eats.

## 2012-10-02 ENCOUNTER — Encounter (HOSPITAL_COMMUNITY): Payer: Self-pay | Admitting: *Deleted

## 2012-10-02 ENCOUNTER — Inpatient Hospital Stay (HOSPITAL_COMMUNITY): Payer: Medicaid Other

## 2012-10-02 DIAGNOSIS — I4891 Unspecified atrial fibrillation: Secondary | ICD-10-CM | POA: Diagnosis present

## 2012-10-02 DIAGNOSIS — E1165 Type 2 diabetes mellitus with hyperglycemia: Secondary | ICD-10-CM | POA: Diagnosis present

## 2012-10-02 DIAGNOSIS — IMO0002 Reserved for concepts with insufficient information to code with codable children: Secondary | ICD-10-CM | POA: Diagnosis present

## 2012-10-02 DIAGNOSIS — K921 Melena: Secondary | ICD-10-CM

## 2012-10-02 DIAGNOSIS — R112 Nausea with vomiting, unspecified: Secondary | ICD-10-CM | POA: Diagnosis present

## 2012-10-02 LAB — CBC
HCT: 36.1 % — ABNORMAL LOW (ref 39.0–52.0)
Hemoglobin: 12.4 g/dL — ABNORMAL LOW (ref 13.0–17.0)
MCH: 29.9 pg (ref 26.0–34.0)
MCH: 29.5 pg (ref 26.0–34.0)
MCHC: 34.3 g/dL (ref 30.0–36.0)
MCV: 87.7 fL (ref 78.0–100.0)
Platelets: 214 10*3/uL (ref 150–400)
RDW: 15.1 % (ref 11.5–15.5)

## 2012-10-02 LAB — CBC WITH DIFFERENTIAL/PLATELET
Basophils Absolute: 0.1 10*3/uL (ref 0.0–0.1)
Basophils Relative: 0 % (ref 0–1)
Eosinophils Absolute: 0.1 10*3/uL (ref 0.0–0.7)
MCH: 29.3 pg (ref 26.0–34.0)
MCHC: 34.3 g/dL (ref 30.0–36.0)
Neutrophils Relative %: 88 % — ABNORMAL HIGH (ref 43–77)
Platelets: 222 10*3/uL (ref 150–400)
RBC: 4.47 MIL/uL (ref 4.22–5.81)

## 2012-10-02 LAB — GLUCOSE, CAPILLARY
Glucose-Capillary: 153 mg/dL — ABNORMAL HIGH (ref 70–99)
Glucose-Capillary: 162 mg/dL — ABNORMAL HIGH (ref 70–99)
Glucose-Capillary: 211 mg/dL — ABNORMAL HIGH (ref 70–99)

## 2012-10-02 LAB — BASIC METABOLIC PANEL
BUN: 31 mg/dL — ABNORMAL HIGH (ref 6–23)
Calcium: 9.4 mg/dL (ref 8.4–10.5)
GFR calc non Af Amer: 56 mL/min — ABNORMAL LOW (ref >90)
Glucose, Bld: 227 mg/dL — ABNORMAL HIGH (ref 70–99)
Sodium: 137 mEq/L (ref 135–145)

## 2012-10-02 LAB — CLOSTRIDIUM DIFFICILE BY PCR: Toxigenic C. Difficile by PCR: NEGATIVE

## 2012-10-02 MED ORDER — IOHEXOL 300 MG/ML  SOLN
25.0000 mL | INTRAMUSCULAR | Status: AC
  Administered 2012-10-02 (×2): 25 mL via ORAL

## 2012-10-02 MED ORDER — ALPRAZOLAM 0.5 MG PO TABS: 0.5000 mg | ORAL_TABLET | Freq: Two times a day (BID) | ORAL | Status: DC | PRN

## 2012-10-02 MED ORDER — INSULIN DETEMIR 100 UNIT/ML ~~LOC~~ SOLN
5.0000 [IU] | Freq: Every day | SUBCUTANEOUS | Status: DC
  Administered 2012-10-02 – 2012-10-07 (×6): 5 [IU] via SUBCUTANEOUS
  Filled 2012-10-02 (×6): qty 0.05

## 2012-10-02 MED ORDER — SODIUM CHLORIDE 0.9 % IJ SOLN
3.0000 mL | Freq: Two times a day (BID) | INTRAMUSCULAR | Status: DC
Start: 2012-10-02 — End: 2012-10-07
  Administered 2012-10-02 – 2012-10-07 (×3): 3 mL via INTRAVENOUS

## 2012-10-02 MED ORDER — INSULIN ASPART 100 UNIT/ML ~~LOC~~ SOLN: 0.0000 [IU] | Freq: Every day | SUBCUTANEOUS | Status: DC

## 2012-10-02 MED ORDER — LABETALOL HCL 5 MG/ML IV SOLN
10.0000 mg | Freq: Four times a day (QID) | INTRAVENOUS | Status: DC | PRN
  Administered 2012-10-02: 10 mg via INTRAVENOUS
  Filled 2012-10-02 (×2): qty 4

## 2012-10-02 MED ORDER — IOHEXOL 300 MG/ML  SOLN
100.0000 mL | Freq: Once | INTRAMUSCULAR | Status: AC | PRN
  Administered 2012-10-02: 100 mL via INTRAVENOUS

## 2012-10-02 MED ORDER — SODIUM CHLORIDE 0.9 % IV SOLN
INTRAVENOUS | Status: AC
  Administered 2012-10-02: 1000 mL via INTRAVENOUS
  Administered 2012-10-02: 02:00:00 via INTRAVENOUS
  Administered 2012-10-05: 75 mL/h via INTRAVENOUS

## 2012-10-02 MED ORDER — DIGOXIN 250 MCG PO TABS
250.0000 ug | ORAL_TABLET | Freq: Every day | ORAL | Status: DC
  Administered 2012-10-02 – 2012-10-07 (×6): 250 ug via ORAL
  Filled 2012-10-02 (×6): qty 1

## 2012-10-02 MED ORDER — ONDANSETRON HCL 4 MG/2ML IJ SOLN
4.0000 mg | Freq: Four times a day (QID) | INTRAMUSCULAR | Status: DC | PRN
  Administered 2012-10-02 – 2012-10-05 (×2): 4 mg via INTRAVENOUS
  Filled 2012-10-02 (×2): qty 2

## 2012-10-02 MED ORDER — METOPROLOL SUCCINATE ER 50 MG PO TB24
50.0000 mg | ORAL_TABLET | Freq: Two times a day (BID) | ORAL | Status: DC
  Administered 2012-10-02 – 2012-10-07 (×12): 50 mg via ORAL
  Filled 2012-10-02 (×13): qty 1

## 2012-10-02 MED ORDER — PIPERACILLIN-TAZOBACTAM 3.375 G IVPB
3.3750 g | Freq: Three times a day (TID) | INTRAVENOUS | Status: AC
  Administered 2012-10-02 – 2012-10-05 (×9): 3.375 g via INTRAVENOUS
  Filled 2012-10-02 (×11): qty 50

## 2012-10-02 MED ORDER — METOPROLOL SUCCINATE ER 50 MG PO TB24: 50.0000 mg | ORAL_TABLET | Freq: Two times a day (BID) | ORAL | Status: DC

## 2012-10-02 MED ORDER — ATORVASTATIN CALCIUM 20 MG PO TABS
20.0000 mg | ORAL_TABLET | Freq: Every day | ORAL | Status: DC
  Administered 2012-10-02 – 2012-10-07 (×6): 20 mg via ORAL
  Filled 2012-10-02 (×6): qty 1

## 2012-10-02 MED ORDER — ENALAPRIL MALEATE 5 MG PO TABS
5.0000 mg | ORAL_TABLET | Freq: Every day | ORAL | Status: DC
  Administered 2012-10-02 – 2012-10-06 (×5): 5 mg via ORAL
  Filled 2012-10-02 (×5): qty 1

## 2012-10-02 MED ORDER — METRONIDAZOLE 500 MG PO TABS
500.0000 mg | ORAL_TABLET | Freq: Three times a day (TID) | ORAL | Status: DC
  Administered 2012-10-02 (×3): 500 mg via ORAL
  Filled 2012-10-02 (×5): qty 1

## 2012-10-02 MED ORDER — ONDANSETRON HCL 4 MG PO TABS
4.0000 mg | ORAL_TABLET | Freq: Four times a day (QID) | ORAL | Status: DC | PRN
  Administered 2012-10-02: 4 mg via ORAL
  Filled 2012-10-02: qty 1

## 2012-10-02 MED ORDER — INSULIN ASPART 100 UNIT/ML ~~LOC~~ SOLN
0.0000 [IU] | Freq: Three times a day (TID) | SUBCUTANEOUS | Status: DC
  Administered 2012-10-02 – 2012-10-03 (×2): 2 [IU] via SUBCUTANEOUS
  Administered 2012-10-03: 1 [IU] via SUBCUTANEOUS
  Administered 2012-10-04 – 2012-10-05 (×3): 2 [IU] via SUBCUTANEOUS
  Administered 2012-10-06: 5 [IU] via SUBCUTANEOUS
  Administered 2012-10-06 (×2): 1 [IU] via SUBCUTANEOUS
  Administered 2012-10-07: 5 [IU] via SUBCUTANEOUS
  Administered 2012-10-07: 1 [IU] via SUBCUTANEOUS
  Administered 2012-10-07: 2 [IU] via SUBCUTANEOUS

## 2012-10-02 MED ORDER — MORPHINE SULFATE 2 MG/ML IJ SOLN
1.0000 mg | INTRAMUSCULAR | Status: DC | PRN
Start: 2012-10-02 — End: 2012-10-07
  Administered 2012-10-02: 1 mg via INTRAVENOUS
  Filled 2012-10-02: qty 1

## 2012-10-02 MED ORDER — INSULIN ASPART 100 UNIT/ML ~~LOC~~ SOLN
0.0000 [IU] | Freq: Three times a day (TID) | SUBCUTANEOUS | Status: DC
  Administered 2012-10-02: 10:00:00 via SUBCUTANEOUS
  Administered 2012-10-02: 3 [IU] via SUBCUTANEOUS

## 2012-10-02 NOTE — Progress Notes (Addendum)
[facility] PROGRESS NOTE  [name] WUJ:[id number] DOB: December 11, 1931 DOA: 10/01/2012 PCP: Pcp Not In System  Brief narrative 79 year old male, [place]nian speaking, PMH of A. fib on chronic anticoagulation, HTN, DM, CAD, CABG, PAD was discharged on 09/29/12 following laparoscopic cholecystectomy on 3/20. Nuclear stress test at that admission was negative. He was admitted again on 10/01/12 with complaints of nausea, vomiting, abdominal pain, diarrhea mixed with blood which started a day prior to admission. In the ED, A. fib with RVR, WBC 23K, INR 1.8, hemoglobin 13. Patient admitted for further evaluation and management.  Assessment/Plan: 1. Nausea, vomiting, abdominal pain, diarrhea with blood in stools: C. difficile PCR negative. DD-other infectious causes of diarrhea (acute viral GE), ischemic colitis, diverticulosis/diverticulitis. Followup CT abdomen. Continue clear liquids as tolerated. We'll continue Flagyl for now. Coumadin and aspirin are held. Monitor H&H closely. Consider GI consultation if significant drop in hemoglobin or if does not improve. Check lipase. CT Abdomen: Severe Colitis and ? Aspiration. D/C Flagyl. Start IV Zosyn. If does not get better, will need GI consultation. 2. Atrial fibrillation with RVR: Heart rate is better controlled. Anticoagulation held secondary to GI bleed. Continue digoxin and metoprolol. Digoxin level was 0.6 the 3. Uncontrolled type 2 diabetes mellitus: Hold by mouth medications. Start low dose Levemir and SSI. A1c 9 on 3/18. 4. Hypertension: Uncontrolled-precipitated by pain, nausea and vomiting. Continue metoprolol and when necessary IV labetalol and monitor. 5. Acute renal failure: Likely secondary to dehydration from problem #1. Continue IV fluids. Improving. Follow BMP in a.m. 6. Significant leukocytosis: Probably from problem #1 and stress response. Follow CBCs a.m. Chest x-ray negative (free air under diaphragm consistent with recent abdominal  surgery). Repeat UA 7. ? Aspiration due to vomiting: Start IV Zosyn.  C.ode Status:  Full Family Communication:  discussed with patient via phone interpreter. Discussed with daughter Kw[place], Maahs via phone. Disposition Plan:  home when medically stable.   Consultants:  None  Procedures:  None  Antibiotics:  by mouth Flagyl 3/24 >3/25  IV Zosyn 3/25 >   HPI/Subjective:  Discussed with patient using telephone interpreter. Patient indicates that he feels slightly better. No BMs since ED stay. Vomiting x2 this morning-no blood. Lower abdominal pain.  Objective: Filed Vitals:   10/01/12 2330 10/02/12 0035 10/02/12 0556 10/02/12 0957  BP: 169/101 162/115 158/122 194/99  Pulse: 118 106 65 101  Temp:  97.6 F (36.4 C) 97.6 F (36.4 C) 97.9 F (36.6 C)  TempSrc:  Oral Oral Oral  Resp: 27 22 20 20   Height:  5\' 8"  (1.727 m)    Weight:  65.4 kg (144 lb 2.9 oz)    SpO2: 96% 94% 97% 97%    Intake/Output Summary (Last 24 hours) at 10/02/12 1311 Last data filed at 10/02/12 0900  Gross per 24 hour  Intake    325 ml  Output    400 ml  Net    -75 ml   Filed Weights   10/02/12 0035  Weight: 65.4 kg (144 lb 2.9 oz)    Exam:   General exam: Comfortable. Does not appear septic or toxic.   Respiratory system: Clear. No increased work of breathing.  Cardiovascular system: S1 & S2 heard, RRR. No JVD, murmurs, gallops, clicks or pedal edema. Telemetry shows A. fib in the 90s to   Gastrointestinal system: Abdomen is nondistended and soft . Mild lower quadrants abdominal tenderness but without rigidity, rebound or guarding. Normal bowel sounds heard.  Central nervous system: Alert and oriented. No focal neurological  deficits.  Extremities: Symmetric 5 x 5 power.   Data Reviewed: Basic Metabolic Panel:  Recent Labs Lab 09/26/12 0545 09/28/12 0530 09/28/12 0930 10/01/12 2003 10/02/12 0540  NA 133* 136  --  135 137  K 4.3 4.1  --  4.0 4.1  CL 99 100  --  98 101   CO2 23 25  --  22 22  GLUCOSE 217* 192*  --  310* 227*  BUN 20 21  --  34* 31*  CREATININE 1.05 1.22  --  1.40* 1.20  CALCIUM 9.1 9.0  --  9.6 9.4  MG  --   --  1.7  --   --    Liver Function Tests:  Recent Labs Lab 10/01/12 2003  AST 23  ALT 35  ALKPHOS 53  BILITOT 0.6  PROT 7.2  ALBUMIN 3.1*   No results found for this basename: LIPASE, AMYLASE,  in the last 168 hours No results found for this basename: AMMONIA,  in the last 168 hours CBC:  Recent Labs Lab 09/26/12 0545 09/28/12 0530 10/01/12 2003 10/01/12 2134 10/02/12 0540  WBC 6.2 7.5 23.2* 24.8* 28.3*  NEUTROABS  --   --  20.9* 22.2* 24.8*  HGB 13.7 12.1* 13.3 13.4 13.1  HCT 40.2 36.3* 37.7* 38.5* 38.2*  MCV 86.1 87.3 85.9 86.1 85.5  PLT 223 184 210 222 222   Cardiac Enzymes: No results found for this basename: CKTOTAL, CKMB, CKMBINDEX, TROPONINI,  in the last 168 hours BNP (last 3 results) No results found for this basename: PROBNP,  in the last 8760 hours CBG:  Recent Labs Lab 09/29/12 1109 09/29/12 1618 10/02/12 0047 10/02/12 0736 10/02/12 1134  GLUCAP 203* 77 211* 206* 211*    Recent Results (from the past 240 hour(s))  CLOSTRIDIUM DIFFICILE BY PCR     Status: None   Collection Time    10/02/12 11:05 AM      Result Value Range Status   C difficile by pcr NEGATIVE  NEGATIVE Final     Studies: Dg Chest 2 View  10/01/2012  *RADIOLOGY REPORT*  Clinical Data: Rectal bleeding.  Abdominal pain.  Cholecystectomy on 09/27/2012  CHEST - 2 VIEW  Comparison: Chest x-ray dated 09/24/2012  Findings: There is free air under the diaphragm, probably secondary to the recent cholecystectomy.  There are no acute infiltrates or effusions.  Chronic accentuation of the interstitial markings particularly at the bases.  Prior CABG.  No acute osseous abnormality.  IMPRESSION: No acute disease in the chest.  Free air under the diaphragm consistent with recent abdominal surgery.   Original Report Authenticated By: Francene Boyers, M.D.      Additional labs:   Scheduled Meds: . atorvastatin  20 mg Oral q1800  . digoxin  250 mcg Oral Daily  . enalapril  5 mg Oral Daily  . insulin aspart  0-9 Units Subcutaneous TID WC  . metoprolol succinate  50 mg Oral BID  . metroNIDAZOLE  500 mg Oral Q8H  . sodium chloride  3 mL Intravenous Q12H   Continuous Infusions: . sodium chloride 100 mL/hr at 10/02/12 1610    Principal Problem:   Rectal bleeding Active Problems:   DIABETES MELLITUS   HYPERTENSION, BENIGN   FIBRILLATION, ATRIAL   PVD WITH CLAUDICATION   CAD (coronary artery disease)    Time spent:  40 minutes    [facility]  [facility] Pager [phone].   If 8PM-8AM, please contact night-coverage at [email or web address], password [facility] 10/02/2012, 1:11 PM  LOS: 1 day

## 2012-10-02 NOTE — Progress Notes (Signed)
Patient ID: [name], male   DOB: April 13, 1932, 79 y.o.   MRN: [id number]    Subjective: Pt is known to [place] as we just did a lap chole on him last Thursday.  The patient went home Saturday doing well.  His daughter called our office yesterday concerned as her dad was having bloody diarrhea.  The patient is unable to give a very detailed history.  He was informed to go to the ED.  We came by to check on him given his admitting diagnosis and free air seen on x-ray, which is likely left over CO2 from surgery.  Objective: Vital signs in last 24 hours: Temp:  [97.6 F (36.4 C)-99 F (37.2 C)] 97.9 F (36.6 C) (03/25 0957) Pulse Rate:  [57-118] 101 (03/25 0957) Resp:  [18-27] 20 (03/25 0957) BP: (158-194)/(99-122) 194/99 mmHg (03/25 0957) SpO2:  [94 %-97 %] 97 % (03/25 0957) Weight:  [144 lb 2.9 oz (65.4 kg)] 144 lb 2.9 oz (65.4 kg) (03/25 0035) Last BM Date: 10/02/12  Intake/Output from previous day:   Intake/Output this shift: Total I/O In: 325 [Other:325] Out: 400 [Stool:400]  PE: Abd: soft, minimally tender in lower abdomen, +BS, ND, incisions c/d/i Heart: irreg, irreg Lungs: CTAB  Lab Results:   Recent Labs  10/01/12 2134 10/02/12 0540  WBC 24.8* 28.3*  HGB 13.4 13.1  HCT 38.5* 38.2*  PLT 222 222   BMET  Recent Labs  10/01/12 2003 10/02/12 0540  NA 135 137  K 4.0 4.1  CL 98 101  CO2 22 22  GLUCOSE 310* 227*  BUN 34* 31*  CREATININE 1.40* 1.20  CALCIUM 9.6 9.4   PT/INR  Recent Labs  10/01/12 2003  LABPROT 20.9*  INR 1.88*   CMP     Component Value Date/Time   NA 137 10/02/2012 0540   K 4.1 10/02/2012 0540   CL 101 10/02/2012 0540   CO2 22 10/02/2012 0540   GLUCOSE 227* 10/02/2012 0540   BUN 31* 10/02/2012 0540   CREATININE 1.20 10/02/2012 0540   CALCIUM 9.4 10/02/2012 0540   PROT 7.2 10/01/2012 2003   ALBUMIN 3.1* 10/01/2012 2003   AST 23 10/01/2012 2003   ALT 35 10/01/2012 2003   ALKPHOS 53 10/01/2012 2003   BILITOT 0.6 10/01/2012 2003   GFRNONAA 56*  10/02/2012 0540   GFRAA 65* 10/02/2012 0540   Lipase     Component Value Date/Time   LIPASE 110* 09/25/2012 0656       Studies/Results: Dg Chest 2 View  10/01/2012  *RADIOLOGY REPORT*  Clinical Data: Rectal bleeding.  Abdominal pain.  Cholecystectomy on 09/27/2012  CHEST - 2 VIEW  Comparison: Chest x-ray dated 09/24/2012  Findings: There is free air under the diaphragm, probably secondary to the recent cholecystectomy.  There are no acute infiltrates or effusions.  Chronic accentuation of the interstitial markings particularly at the bases.  Prior CABG.  No acute osseous abnormality.  IMPRESSION: No acute disease in the chest.  Free air under the diaphragm consistent with recent abdominal surgery.   Original Report Authenticated By: Francene Boyers, M.D.     Anti-infectives: Anti-infectives   Start     Dose/Rate Route Frequency Ordered Stop   10/02/12 0100  metroNIDAZOLE (FLAGYL) tablet 500 mg     500 mg Oral 3 times per day 10/02/12 0023         Assessment/Plan  1. S/p lap chole 2. Hematochezia  3. A fib  Plan: 1. There does not appear to be a  surgical complication.  The free air on x-ray is likely post surgical as his abdomen is soft and essentially almost nontender. 2. Suspect that he likely has some type of colitis.  C. Diff colitis is negative. 3. We will follow.  He may need a CT scan and GI consult.   LOS: 1 day    Blannie Shedlock E 10/02/2012, 1:28 PM Pager: [phone]

## 2012-10-02 NOTE — Progress Notes (Signed)
Pt BP at 1600/ labetalol 10 mg iv given. Bp now 153/101 Dr. Waymon Amato notified via text page

## 2012-10-02 NOTE — Consult Note (Signed)
ANTIBIOTIC CONSULT NOTE - INITIAL  Pharmacy Consult for Zosyn Indication: Severe Colitis, Bibasilar PNA   Hospital Problems: Principal Problem:   Nausea, vomiting, and diarrhea Active Problems:   HYPERTENSION, BENIGN   PVD WITH CLAUDICATION   CAD (coronary artery disease)   Rectal bleeding   Atrial fibrillation with RVR   Diabetes mellitus type 2, uncontrolled   Allergies: Allergies  Allergen Reactions  . Percocet (Oxycodone-Acetaminophen) Other (See Comments)    "almost a heart attack"    Patient Measurements: Height: 5\' 8"  (172.7 cm) Weight: 144 lb 2.9 oz (65.4 kg) IBW/kg (Calculated) : 68.4  Vital Signs: BP 194/98  Pulse 100  Temp(Src) 98.7 F (37.1 C) (Oral)  Resp 20  Ht 5\' 8"  (1.727 m)  Wt 144 lb 2.9 oz (65.4 kg)  BMI 21.93 kg/m2  SpO2 99%  Labs:  Recent Labs  10/01/12 2003 10/01/12 2134 10/02/12 0540 10/02/12 1355  WBC 23.2* 24.8* 28.3* 24.0*  HGB 13.3 13.4 13.1 12.4*  PLT 210 222 222 191  CREATININE 1.40*  --  1.20  --    Estimated Creatinine Clearance: 46.9 ml/min (by C-G formula based on Cr of 1.2).   Microbiology: Recent Results (from the past 720 hour(s))  CLOSTRIDIUM DIFFICILE BY PCR     Status: None   Collection Time    10/02/12 11:05 AM      Result Value Range Status   C difficile by pcr NEGATIVE  NEGATIVE Final    Medical/Surgical History: Past Medical History  Diagnosis Date  . Permanent atrial fibrillation   . Hypertension   . HNP (herniated nucleus pulposus), lumbar   . PVD (peripheral vascular disease)   . Diabetes mellitus   . CAD (coronary artery disease)     s/p CABG x 4  . Ventricular hypertrophy    Past Surgical History  Procedure Laterality Date  . Cardiac catheterization  01/2002    40-50% distal LM/ostial LAD disease, 50-60% ostial LAD, 60% mid LAD, 30-40% D1, 70% tubular mid LCx, 70% ostial disease in two ramus intermedius branches, 70% prox RCA, 50-60% mid RCA, 70% distal RCA, LVEF 40%   . Coronary artery  bypass graft  01/2002    x 4: LIMA-LAD, SVG-OM, SVG-PDA-PLB  . Cholecystectomy N/A 09/27/2012    Procedure: LAPAROSCOPIC CHOLECYSTECTOMY WITH INTRAOPERATIVE CHOLANGIOGRAM;  Surgeon: Lodema Pilot, DO;  Location: [facility] OR;  Service: General;  Laterality: N/A;    Medications:  Scheduled:  . atorvastatin  20 mg Oral q1800  . digoxin  250 mcg Oral Daily  . enalapril  5 mg Oral Daily  . insulin aspart  0-5 Units Subcutaneous QHS  . insulin aspart  0-9 Units Subcutaneous TID WC  . insulin detemir  5 Units Subcutaneous Daily  . [COMPLETED] iohexol  25 mL Oral Q1 Hr x 2  . metoprolol succinate  50 mg Oral BID  . sodium chloride  3 mL Intravenous Q12H  . [DISCONTINUED] insulin aspart  0-9 Units Subcutaneous TID WC  . [DISCONTINUED] metoprolol succinate  50 mg Oral BID  . [DISCONTINUED] metroNIDAZOLE  500 mg Oral Q8H   Anti-infectives   Start     Dose/Rate Route Frequency Ordered Stop   10/02/12 0100  metroNIDAZOLE (FLAGYL) tablet 500 mg  Status:  Discontinued     500 mg Oral 3 times per day 10/02/12 0023 10/02/12 1631      Assessment:  79 y/o male s/p lap chole last week admitted with bloody diarrhea.  CT positive for pancolitis and Bibasilar PNA, possible  Aspiration.  Patient to be started on Zosyn per protocol.  Goal of Therapy:   Resolution of infection and normalization of WBC.  Plan:   Begin Zosyn 3.375 gm IV q 8 hours, each dose to infuse over 4 hours    Follow up culture results  Matilde Markie, Elisha Headland,  Pharm.D.,    3/25/20144:47 PM

## 2012-10-02 NOTE — Progress Notes (Signed)
Utilization review 

## 2012-10-02 NOTE — Progress Notes (Signed)
CT A/P reviewed with radiologist: significant pancolitis, mild bibasilar pneumonia/infiltrate, residual post-op free air.  Abdominal exam with only minimal LLQ tenderness.  Recommend medical therapy of colitis and pulmonary issues.  We will follow.  I also D/W Dr. Biagio Quint Patient examined and I agree with the assessment and plan  Violeta Gelinas, MD, MPH, FACS Pager: [phone]  10/02/2012 3:33 PM

## 2012-10-03 DIAGNOSIS — K529 Noninfective gastroenteritis and colitis, unspecified: Secondary | ICD-10-CM | POA: Diagnosis present

## 2012-10-03 DIAGNOSIS — I10 Essential (primary) hypertension: Secondary | ICD-10-CM

## 2012-10-03 DIAGNOSIS — K5289 Other specified noninfective gastroenteritis and colitis: Secondary | ICD-10-CM

## 2012-10-03 DIAGNOSIS — R197 Diarrhea, unspecified: Secondary | ICD-10-CM

## 2012-10-03 DIAGNOSIS — K625 Hemorrhage of anus and rectum: Secondary | ICD-10-CM

## 2012-10-03 DIAGNOSIS — I4891 Unspecified atrial fibrillation: Secondary | ICD-10-CM

## 2012-10-03 DIAGNOSIS — R933 Abnormal findings on diagnostic imaging of other parts of digestive tract: Secondary | ICD-10-CM

## 2012-10-03 LAB — GLUCOSE, CAPILLARY
Glucose-Capillary: 125 mg/dL — ABNORMAL HIGH (ref 70–99)
Glucose-Capillary: 94 mg/dL (ref 70–99)

## 2012-10-03 LAB — CBC
Platelets: 198 10*3/uL (ref 150–400)
RBC: 3.89 MIL/uL — ABNORMAL LOW (ref 4.22–5.81)
WBC: 21.8 10*3/uL — ABNORMAL HIGH (ref 4.0–10.5)

## 2012-10-03 LAB — BASIC METABOLIC PANEL
Calcium: 8.4 mg/dL (ref 8.4–10.5)
GFR calc non Af Amer: 57 mL/min — ABNORMAL LOW (ref >90)
Sodium: 135 mEq/L (ref 135–145)

## 2012-10-03 NOTE — Progress Notes (Signed)
Appreciate the translation by our new midlevel provider. Violeta Gelinas, MD, MPH, FACS Pager: [phone]

## 2012-10-03 NOTE — Progress Notes (Signed)
Received a call from pts daughter [name] returning a call for Lianne Bushy who requested to speak with her. Paged to sarah at [phone] to make aware. Awaiting a call back. Dillie Burandt, Charlyne Quale Received call back. Message and phone number relayed for Maralyn Sago to call Sonja back.Gershom Brobeck, Charlyne Quale

## 2012-10-03 NOTE — Progress Notes (Signed)
[facility] PROGRESS NOTE  [name] WUJ:[id number] DOB: 1932/03/07 DOA: 10/01/2012 PCP: Pcp Not In System  Assessment/Plan: Principal Problem:   Nausea, vomiting, and diarrhea Active Problems:   HYPERTENSION, BENIGN   PVD WITH CLAUDICATION   CAD (coronary artery disease)   Rectal bleeding   Atrial fibrillation with RVR   Diabetes mellitus type 2, uncontrolled    1. Acute colitis: Patient presented with nausea, vomiting, abdominal pain, diarrhea with blood in stools. C. difficile PCR negative. Abdominal/pelvic CT scan revealed findings, compatible with severe colitis. There was pneumoperitoneum and subcutaneous emphysema in the anterior  right abdominal wall are presumably postoperative in this patient  with recent history of laparoscopic cholecystectomy. Etiologies include infective, vs ischemic colitis. I favor the latter, against a known background of atrial fibrillation and PVD. Mananing with bowel rest and supportive treatment. Patient was originally treated with Flagyl, but switched to iv Zosyn from 10/02/12. Clinically, he appears better, but is still having bloody diarrhea, and a persistent leukocytosis. Have consulted [facility] today. Of note, surgical team paid a visit today, in view of pneumoperitoneum, seen on CT scan. Per surgeons, this is not unexpected, and is not concerning. Tolerating clears.  2. Atrial fibrillation with RVR: Patient was in rapid atrial fibrillation at presentation. Heart rate is now controlled on controlled on Digoxin and Metoprolol. Anticoagulation held secondary to GI bleed. 3. Uncontrolled type 2 diabetes mellitus: Oral hypoglycemics are on hold. Managing with Levemir/SSI, adjusted as indicated. HBA1c was 9 on 09/25/12. 4. Hypertension: This was initially uncontrolled, due to pain, nausea and vomiting. Appears to have responded to beta-blocker and prn labetalol. 5. Acute renal failure: Creatinine was 1.40 at presentation, BUN 34, against a known  baseline creatinine of 1.22 on 09/28/12. This is AKI, likely secondary to dehydration from problem #1. Improved with iv fluids. Creatinine is 1.18 today.  fluids.  Following renal indices 6. ? Aspiration due to vomiting: This is suggested by abdominal CT  findings, which revealed extensive bibasilar interstitial and airspace disease, particularly in the lower lobes, concerning for sequela of recent aspiration.  Adequately addressed with Zosyn. Patient currently has no cough, pyrexia or SOB.   Code Status: Full Code.  Family Communication:  Disposition Plan: To be determined.    Brief narrative: 79 year old male, [place]nian speaking, with history of A. fib on chronic anticoagulation, HTN, DM, CAD, CABG, PAD who was discharged on 09/29/12 following laparoscopic cholecystectomy on 09/27/12. Nuclear stress test at that admission was negative. He was admitted again on 10/01/12 with complaints of nausea, vomiting, abdominal pain, diarrhea mixed with blood which started a day prior to admission. In the ED, A. fib with RVR, WBC 23K, INR 1.8, hemoglobin 13. Patient admitted for further evaluation and management.   Consultants:   GI.   Dr Violeta Gelinas, surgery.   Procedures:  CXR.  Abdominal?pelvic CT.   Antibiotics:  Flagyl 10/01/12-10/02/12.   Zosyn 10/02/12>>>  HPI/Subjective: Feels better. No vomiting today. Had bloody diarrhea in the morning.   Objective: Vital signs in last 24 hours: Temp:  [98.3 F (36.8 C)-99.1 F (37.3 C)] 98.4 F (36.9 C) (03/26 0955) Pulse Rate:  [86-93] 86 (03/26 0955) Resp:  [18-20] 20 (03/26 0955) BP: (151-178)/(62-101) 153/62 mmHg (03/26 0955) SpO2:  [97 %-98 %] 98 % (03/26 0955) Weight:  [66.8 kg (147 lb 4.3 oz)] 66.8 kg (147 lb 4.3 oz) (03/25 2116) Weight change: 1.4 kg (3 lb 1.4 oz) Last BM Date: 10/03/12  Intake/Output from previous day: 03/25 0701 - 03/26  0700 In: 3211.7 [P.O.:120; I.V.:2766.7] Out: 400 [Stool:400] Total I/O In: 120  [P.O.:120] Out: 1000 [Urine:1000]   Physical Exam: General: Comfortable, alert, communicative, not short of breath at rest.  HEENT:  Mild clinical pallor, no jaundice, no conjunctival injection or discharge. Hydration is satisfactory.  NECK:  Supple, JVP not seen, no carotid bruits, no palpable lymphadenopathy, no palpable goiter. CHEST:  Clinically clear to auscultation, no wheezes, no crackles. HEART:  Sounds 1 and 2 heard, normal, irregular, no murmurs. ABDOMEN:  Full, soft, healed laparoscopic wounds, only mild tenderness, no palpable organomegaly, no palpable masses, normal bowel sounds. GENITALIA:  Not examined. LOWER EXTREMITIES:  No pitting edema, palpable peripheral pulses. MUSCULOSKELETAL SYSTEM:  Generalized osteoarthritic changes, otherwise, normal. CENTRAL NERVOUS SYSTEM:  No focal neurologic deficit on gross examination.  Lab Results:  Recent Labs  10/02/12 2157 10/03/12 0550  WBC 25.8* 21.8*  HGB 12.5* 11.3*  HCT 37.2* 34.2*  PLT 214 198    Recent Labs  10/02/12 0540 10/03/12 0550  NA 137 135  K 4.1 3.7  CL 101 103  CO2 22 24  GLUCOSE 227* 117*  BUN 31* 21  CREATININE 1.20 1.18  CALCIUM 9.4 8.4   Recent Results (from the past 240 hour(s))  CLOSTRIDIUM DIFFICILE BY PCR     Status: None   Collection Time    10/02/12 11:05 AM      Result Value Range Status   C difficile by pcr NEGATIVE  NEGATIVE Final     Studies/Results: Dg Chest 2 View  10/01/2012  *RADIOLOGY REPORT*  Clinical Data: Rectal bleeding.  Abdominal pain.  Cholecystectomy on 09/27/2012  CHEST - 2 VIEW  Comparison: Chest x-ray dated 09/24/2012  Findings: There is free air under the diaphragm, probably secondary to the recent cholecystectomy.  There are no acute infiltrates or effusions.  Chronic accentuation of the interstitial markings particularly at the bases.  Prior CABG.  No acute osseous abnormality.  IMPRESSION: No acute disease in the chest.  Free air under the diaphragm consistent  with recent abdominal surgery.   Original Report Authenticated By: Francene Boyers, M.D.    Ct Abdomen Pelvis W Contrast  10/02/2012  *RADIOLOGY REPORT*  Clinical Data: Acute abdominal pain.  Recent history of laparoscopic cholecystectomy.  CT ABDOMEN AND PELVIS WITH CONTRAST  Technique:  Multidetector CT imaging of the abdomen and pelvis was performed following the standard protocol during bolus administration of intravenous contrast.  Contrast: OMNIPAQUE IOHEXOL 300 MG/ML  SOLN  Comparison: No priors.  Findings:  Lung Bases: Extensive bilateral lower lobe septal thickening, peribronchovascular interstitial prominence and airspace disease. Heart size is mildly enlarged. There is no significant pericardial fluid, thickening or pericardial calcification.  Atherosclerotic calcifications are noted in the left anterior descending, left circumflex and right coronary arteries.  Status post median sternotomy for CABG.  Abdomen/Pelvis:  Postoperative changes of recent cholecystectomy are noted with a small amount of soft tissue stranding in the gallbladder fossa.  No abnormal fluid collection in the gallbladder fossa is noted at this time to suggest the presence of a biloma or a developing abscess.  There is a focal area of decreased attenuation in segment 4A of the liver which may represent a perfusion anomaly or focal fat.  The liver has a slightly shrunken appearance and nodular contour, compatible with early changes of cirrhosis.  The appearance of the pancreas, spleen and bilateral adrenal glands is unremarkable.  There are several subcentimeter low attenuation lesions in the kidneys bilaterally which are too  small to definitively characterize.  Additionally, there are patchy areas of parenchymal thinning in the kidneys bilaterally, particularly in the posterior aspect of the right kidney, compatible with scarring related to prior infarctions or infection.  There is a moderate volume of pneumoperitoneum,  predominately in and around the gallbladder fossa as well as anterior to the liver. There is also some gas tracking in the anterior abdominal wall on the right side.  There is a trace volume of ascites, as well as stranding around portions of the colon, particularly in the left pericolic gutter.  The entire colon appears thickened and hypervascular, strongly suggestive of a colitis.  No pathologic distension of small bowel.  There are reactive sized mesenteric and retroperitoneal lymph nodes noted.  Severe atherosclerosis of the abdominal and pelvic vasculature, including what appears to be a high-grade stenosis or complete occlusion of the superficial femoral arteries bilaterally.  Prostate is heavily calcified but otherwise unremarkable in appearance.  Urinary bladder is normal in appearance.  Musculoskeletal: There are no aggressive appearing lytic or blastic lesions noted in the visualized portions of the skeleton.  IMPRESSION: 1.  Findings, as above, compatible with severe colitis. 2.  Pneumoperitoneum and subcutaneous emphysema in the anterior right abdominal wall are presumably postoperative in this patient with recent history of laparoscopic cholecystectomy. 3.  Extensive bibasilar interstitial and airspace disease, particularly in the lower lobes, concerning for sequela of recent aspiration.  Clinical correlation is recommended. 4.  Severe atherosclerosis, including multivessel coronary artery disease in this patient status post CABG.  Notably, there appears to be either high-grade stenosis or complete occlusion of the superficial femoral arteries bilaterally. 4.  The appearance of the liver suggests early changes of cirrhosis, as above. 5.  Additional incidental findings, as above.   Original Report Authenticated By: Trudie Reed, M.D.     Medications: Scheduled Meds: . atorvastatin  20 mg Oral q1800  . digoxin  250 mcg Oral Daily  . enalapril  5 mg Oral Daily  . insulin aspart  0-5 Units  Subcutaneous QHS  . insulin aspart  0-9 Units Subcutaneous TID WC  . insulin detemir  5 Units Subcutaneous Daily  . metoprolol succinate  50 mg Oral BID  . piperacillin-tazobactam  3.375 g Intravenous Q8H  . sodium chloride  3 mL Intravenous Q12H   Continuous Infusions: . sodium chloride 1,000 mL (10/02/12 1428)   PRN Meds:.ALPRAZolam, labetalol, morphine injection, ondansetron (ZOFRAN) IV, ondansetron    LOS: 2 days   Zenola Dezarn,CHRISTOPHER  [facility] Pager [phone]. If 8PM-8AM, please contact night-coverage at [email or web address], password [facility] 10/03/2012, 4:46 PM  LOS: 2 days

## 2012-10-03 NOTE — Consult Note (Signed)
[facility] Consult: 3:17 PM 10/03/2012   Referring Provider: Dr Brien Few Primary Care Physician: will be DR  Ludwig Clarks Primary Gastroenterologist:  none  Reason for Consultation:  Bloody diarrhea and colitis on CT scan.   HPI: [name] is a 79 y.o. male.  [place] man who speaks no [place].  Living in [place] with dtr for about 10 years. Severe peripheral vascular disease.  Deg disc disease.  DM.  CABG in 2003.  Afib in 2012, on chronic Coumadin. Hx depression with attempted suicide in 2013.  Non-compliant with meds.   3/17 - 09/29/12 admit to [facility] for epigastric pain.  Pain incidences post prandial in past as well with associated n/v.  Ultrasound showed gallstones.  LFTs normal. Lipase was elevated at 124 c/w gallstone pancreatitis.  Lap chole on 3/20.  Post op diagnoses of gallstone pancreatitis, GB contracted, filled with single large stone.  IOC was negative. Adhesions of RUQ/GB noted.  Pathology reported: ERODED GALLBLADDER MUCOSA WITH SINGLE 4.2 CM CHOLELITH.  NO TUMOR SEEN. Tolerating solid diet post op.  Had loose stools post op.  Dtr says there was blood even before discharge home but blood not noted by staff who said loose stools not unusual after GB surgery. Doses of Metoprolol increased in place of Cardizem for mgt of tachycardia.  Pt had not been taking his meds.  Not clear if he was taking Coumadin PTA or not but was in place at discharge. Received course of Augmentin for sinusitis.   Readmitted 3/24 with bloody diarrhea, abdominal pain for 24 hours.  Non-bloody emesis.  Until then had been eating ok.  In ED had A fib with RVR, WBCs 23,000, INR1.8, Hgb 13.0 CT scan shows pancolitis, extensive vascular disease with severe atherosclerosis of the abdominal and pelvic vasculature, including what appears to be a high-grade stenosis or complete occlusion of the superficial femoral arteries bilaterally.  Liver looks cirrhotic. Pneumoperitoneum  in region of GB fossa (surgeons feel this is normal post op finding).  Also has bil airspace disease.  The bloody diarrhea and pain have much improved in last 24 hours.  Small loose stool with scant blood this AM.  No current n/v.  Tolerating clears. Says he is tender on left abdomen.  WBCs improved.   Never had an EGD or colonoscopy.      Past Medical History  Diagnosis Date  . Permanent atrial fibrillation   . Hypertension   . HNP (herniated nucleus pulposus), lumbar   . PVD (peripheral vascular disease)   . Diabetes mellitus   . CAD (coronary artery disease)     s/p CABG x 4  . Ventricular hypertrophy     Past Surgical History  Procedure Laterality Date  . Cardiac catheterization  01/2002    40-50% distal LM/ostial LAD disease, 50-60% ostial LAD, 60% mid LAD, 30-40% D1, 70% tubular mid LCx, 70% ostial disease in two ramus intermedius branches, 70% prox RCA, 50-60% mid RCA, 70% distal RCA, LVEF 40%   . Coronary artery bypass graft  01/2002    x 4: LIMA-LAD, SVG-OM, SVG-PDA-PLB  . Cholecystectomy N/A 09/27/2012    Procedure: LAPAROSCOPIC CHOLECYSTECTOMY WITH INTRAOPERATIVE CHOLANGIOGRAM;  Surgeon: Lodema Pilot, DO;  Location: [facility] OR;  Service: General;  Laterality: N/A;    Prior to Admission medications   Medication Sig Start Date End Date Taking? Authorizing Provider  ALPRAZolam Prudy Feeler) 0.5 MG tablet Take 1 tablet (0.5 mg total) by mouth 2 (two) times daily as needed for anxiety. 09/29/12  Yes Nishant Dhungel,  MD  aspirin EC 81 MG EC tablet Take 1 tablet (81 mg total) by mouth daily. 09/29/12  Yes Nishant Dhungel, MD  atorvastatin (LIPITOR) 20 MG tablet Take 20 mg by mouth daily.   Yes Historical Provider, MD  digoxin (LANOXIN) 0.25 MG tablet Take 1 tablet (250 mcg total) by mouth daily. 09/29/12  Yes Nishant Dhungel, MD  enalapril (VASOTEC) 5 MG tablet Take 1 tablet (5 mg total) by mouth daily. 09/29/12  Yes Nishant Dhungel, MD  glyBURIDE (DIABETA) 5 MG tablet Take 1 tablet (5 mg  total) by mouth daily with breakfast. 09/29/12  Yes Nishant Dhungel, MD  HYDROcodone-acetaminophen (NORCO/VICODIN) 5-325 MG per tablet Take 1 tablet by mouth every 4 (four) hours as needed for pain.   Yes Historical Provider, MD  metFORMIN (GLUCOPHAGE) 500 MG tablet Take 1 tablet (500 mg total) by mouth 2 (two) times daily with a meal. 09/29/12  Yes Nishant Dhungel, MD  metoprolol succinate (TOPROL-XL) 50 MG 24 hr tablet Take 1 tablet (50 mg total) by mouth 2 (two) times daily. Take with or immediately following a meal. 09/29/12  Yes Nishant Dhungel, MD  warfarin (COUMADIN) 7.5 MG tablet Take 7.5 mg by mouth every evening. At 6pm   Yes Historical Provider, MD  glucose blood (ACCU-CHEK ADVANTAGE TEST) test strip Use as instructed 09/29/12   Nishant Dhungel, MD    Scheduled Meds: . atorvastatin  20 mg Oral q1800  . digoxin  250 mcg Oral Daily  . enalapril  5 mg Oral Daily  . insulin aspart  0-5 Units Subcutaneous QHS  . insulin aspart  0-9 Units Subcutaneous TID WC  . insulin detemir  5 Units Subcutaneous Daily  . metoprolol succinate  50 mg Oral BID  . piperacillin-tazobactam  3.375 g Intravenous Q8H  . sodium chloride  3 mL Intravenous Q12H   Infusions: . sodium chloride 1,000 mL (10/02/12 1428)   PRN Meds: ALPRAZolam, labetalol, morphine injection, ondansetron (ZOFRAN) IV, ondansetron   Allergies as of 10/01/2012 - Review Complete 10/01/2012  Allergen Reaction Noted  . Percocet (oxycodone-acetaminophen) Other (See Comments) 02/15/2011    Family History  Problem Relation Age of Onset  . Heart disease Mother   . Heart disease Sister   . Breast cancer Sister     History   Social History  . Marital Status: Widowed    Spouse Name: N/A    Number of Children: N/A  . Years of Education: N/A   Occupational History  . Not on file.   Social History Main Topics  . Smoking status: Former Smoker -- 1.00 packs/day for 30 years    Types: Cigarettes    Quit date: 02/15/1991  .  Smokeless tobacco: Not on file  . Alcohol Use: No  . Drug Use: No  . Sexually Active: Not on file   Other Topics Concern  . Not on file   Social History Narrative  . No narrative on file    REVIEW OF SYSTEMS: No nose bleeds.  No flu shot.  Has no idea who is his primary MD.  [facility].  No unusual bleeding from skin No hx transfusions No feet swelling No chest pain No cough No choking on food Daughter says he just returned from [place] 4 to 6  weeks ago.  He had been hospitalized there with heart problems and gone to Nursing home afterwards He has generally unsteady gait.    PHYSICAL EXAM: Vital signs in last 24 hours: Temp:  [  98.3 F (36.8 C)-99.1 F (37.3 C)] 98.4 F (36.9 C) (03/26 0955) Pulse Rate:  [86-93] 86 (03/26 0955) Resp:  [18-20] 20 (03/26 0955) BP: (151-178)/(62-101) 153/62 mmHg (03/26 0955) SpO2:  [97 %-98 %] 98 % (03/26 0955) Weight:  [66.8 kg (147 lb 4.3 oz)] 66.8 kg (147 lb 4.3 oz) (03/25 2116)  General: looks unwell, not toxic Head:  No asymmetry or trauma  Eyes:  No icterus  Ears:  Not HOH Nose:  No congestion Mouth:  Pink, clear oral mm Neck:  No mass,  Lungs:  Crackles bil Heart: Irregularly, irregular.  Not tachy Abdomen:  Soft, NT, ND.  Active BS.  surgical incisions c/d/i Rectal: not done   Musc/Skeltl: no joinnt swelling or deformity Extremities:  No pedal edema or cyanosis  Neurologic:  No tremor or gross weakness Skin:  No rash or sores Tattoos:  none Nodes:  No inguinal adenopathy   Psych:  Cooperative, no obvious confusion.   Intake/Output from previous day: 03/25 0701 - 03/26 0700 In: 3211.7 [P.O.:120; I.V.:2766.7] Out: 400 [Stool:400] Intake/Output this shift: Total I/O In: 120 [P.O.:120] Out: 1000 [Urine:1000]  LAB RESULTS:  Recent Labs  10/02/12 1355 10/02/12 2157 10/03/12 0550  WBC 24.0* 25.8* 21.8*  HGB 12.4* 12.5* 11.3*  HCT 36.1* 37.2* 34.2*  PLT 191 214 198   BMET Lab  Results  Component Value Date   NA 135 10/03/2012   NA 137 10/02/2012   NA 135 10/01/2012   K 3.7 10/03/2012   K 4.1 10/02/2012   K 4.0 10/01/2012   CL 103 10/03/2012   CL 101 10/02/2012   CL 98 10/01/2012   CO2 24 10/03/2012   CO2 22 10/02/2012   CO2 22 10/01/2012   GLUCOSE 117* 10/03/2012   GLUCOSE 227* 10/02/2012   GLUCOSE 310* 10/01/2012   BUN 21 10/03/2012   BUN 31* 10/02/2012   BUN 34* 10/01/2012   CREATININE 1.18 10/03/2012   CREATININE 1.20 10/02/2012   CREATININE 1.40* 10/01/2012   CALCIUM 8.4 10/03/2012   CALCIUM 9.4 10/02/2012   CALCIUM 9.6 10/01/2012   LFT  Recent Labs  10/01/12 2003  PROT 7.2  ALBUMIN 3.1*  AST 23  ALT 35  ALKPHOS 53  BILITOT 0.6   PT/INR Lab Results  Component Value Date   INR 1.88* 10/01/2012   INR 1.18 09/29/2012   INR 1.18 09/28/2012    C-Diff No components found with this basename: cdiff    RADIOLOGY STUDIES: Dg Chest 2 View 10/01/2012 .  IMPRESSION: No acute disease in the chest.  Free air under the diaphragm consistent with recent abdominal surgery.   Original Report Authenticated By: Francene Boyers, M.D.    Ct Abdomen Pelvis W Contrast 10/02/2012    Findings:  Lung Bases: Extensive bilateral lower lobe septal thickening, peribronchovascular interstitial prominence and airspace disease. Heart size is mildly enlarged. There is no significant pericardial fluid, thickening or pericardial calcification.  Atherosclerotic calcifications are noted in the left anterior descending, left circumflex and right coronary arteries.  Status post median sternotomy for CABG.  Abdomen/Pelvis:  Postoperative changes of recent cholecystectomy are noted with a small amount of soft tissue stranding in the gallbladder fossa.  No abnormal fluid collection in the gallbladder fossa is noted at this time to suggest the presence of a biloma or a developing abscess.  There is a focal area of decreased attenuation in segment 4A of the liver which may represent a perfusion anomaly or  focal fat.  The liver has a  slightly shrunken appearance and nodular contour, compatible with early changes of cirrhosis.  The appearance of the pancreas, spleen and bilateral adrenal glands is unremarkable.  There are several subcentimeter low attenuation lesions in the kidneys bilaterally which are too small to definitively characterize.  Additionally, there are patchy areas of parenchymal thinning in the kidneys bilaterally, particularly in the posterior aspect of the right kidney, compatible with scarring related to prior infarctions or infection.  There is a moderate volume of pneumoperitoneum, predominately in and around the gallbladder fossa as well as anterior to the liver. There is also some gas tracking in the anterior abdominal wall on the right side.  There is a trace volume of ascites, as well as stranding around portions of the colon, particularly in the left pericolic gutter.  The entire colon appears thickened and hypervascular, strongly suggestive of a colitis.  No pathologic distension of small bowel.  There are reactive sized mesenteric and retroperitoneal lymph nodes noted.  Severe atherosclerosis of the abdominal and pelvic vasculature, including what appears to be a high-grade stenosis or complete occlusion of the superficial femoral arteries bilaterally.  Prostate is heavily calcified but otherwise unremarkable in appearance.  Urinary bladder is normal in appearance.  Musculoskeletal: There are no aggressive appearing lytic or blastic lesions noted in the visualized portions of the skeleton.  IMPRESSION: 1.  Findings, as above, compatible with severe colitis. 2.  Pneumoperitoneum and subcutaneous emphysema in the anterior right abdominal wall are presumably postoperative in this patient with recent history of laparoscopic cholecystectomy. 3.  Extensive bibasilar interstitial and airspace disease, particularly in the lower lobes, concerning for sequela of recent aspiration.  Clinical  correlation is recommended. 4.  Severe atherosclerosis, including multivessel coronary artery disease in this patient status post CABG.  Notably, there appears to be either high-grade stenosis or complete occlusion of the superficial femoral arteries bilaterally. 4.  The appearance of the liver suggests early changes of cirrhosis, as above. 5.  Additional incidental findings, as above.   Original Report Authenticated By: Trudie Reed, M.D.     ENDOSCOPIC STUDIES: *  Denies any  IMPRESSION: *  Acute colitis. C diff is negative ? Etiology. Suspect ischemia, possible embolic from A fib.  Infectious colitis also in ddx.  Acute sxs of bloody diarrhea, abd pain and nausea subsiding *  Pneumoperitoneum.  Surgeon's feeling is this is post lap chole finding.  *  Pneumonia, aspiration suspected.  On zosyn *  Atherosclerosis.  CABG in 2003.  PVD by CT scan. *  Cirrhotic liver by CT.  By ultrasound the liver looks normal.  Pt does not drink ETOH *  Type 2 DM.  *  Atrial fibrilation.  On chronic Coumadin.  RVR during recent admission with BB added; rapid rate at this admission improved.   PLAN: *  Per Dr Marina Goodell *  Check  Stool pathogens.  *  Will need colonoscopy at some point in future, best to let current colitis calm down first.  *  Will check hepatitis serologies, ceruloplasmin and ANA   LOS: 2 days   Jennye Moccasin  10/03/2012, 3:17 PM Pager: [phone]  GI ATTENDING  History reviewed. Laboratories x-ray reviewed. Patient seen and examined. Agree with history and physical as outlined above. Extremely complicated 79 year old with multiple significant medical problems. Recent cholecystectomy as noted. Presents with some abdominal pain and bloody diarrhea. CT scan shows multiple abnormalities noted, including pancolitis. Suspect that his colitis secondary to ischemia. He is at risk for high-grade mesenteric lesion,  either obstructive or embolic. Thankfully, he does seem to have had some clinical  improvement over the past day. I agree with assessing for enteric pathogens. We will continue to follow him closely. Thank you  Wilhemina Bonito. Eda Keys., M.D. [facility] Division of Gastroenterology

## 2012-10-03 NOTE — Progress Notes (Signed)
Patient ID: [name], male   DOB: [birth year], 79 y.o.   MRN: [id number]    Subjective: Pt actually feeling better today.  Less pain in LLQ.  Tolerating clears without nausea or vomiting  Objective: Vital signs in last 24 hours: Temp:  [98.3 F (36.8 C)-99.1 F (37.3 C)] 98.4 F (36.9 C) (03/26 0955) Pulse Rate:  [86-93] 86 (03/26 0955) Resp:  [18-20] 20 (03/26 0955) BP: (151-178)/(62-101) 153/62 mmHg (03/26 0955) SpO2:  [97 %-98 %] 98 % (03/26 0955) Weight:  [147 lb 4.3 oz (66.8 kg)] 147 lb 4.3 oz (66.8 kg) (03/25 2116) Last BM Date: 10/02/12  Intake/Output from previous day: 03/25 0701 - 03/26 0700 In: 3211.7 [P.O.:120; I.V.:2766.7] Out: 400 [Stool:400] Intake/Output this shift: Total I/O In: 120 [P.O.:120] Out: 1000 [Urine:1000]  PE: Abd: soft, minimally tender, +BS, ND, incisions c/d/i  Lab Results:   Recent Labs  10/02/12 2157 10/03/12 0550  WBC 25.8* 21.8*  HGB 12.5* 11.3*  HCT 37.2* 34.2*  PLT 214 198   BMET  Recent Labs  10/02/12 0540 10/03/12 0550  NA 137 135  K 4.1 3.7  CL 101 103  CO2 22 24  GLUCOSE 227* 117*  BUN 31* 21  CREATININE 1.20 1.18  CALCIUM 9.4 8.4   PT/INR  Recent Labs  10/01/12 2003  LABPROT 20.9*  INR 1.88*   CMP     Component Value Date/Time   NA 135 10/03/2012 0550   K 3.7 10/03/2012 0550   CL 103 10/03/2012 0550   CO2 24 10/03/2012 0550   GLUCOSE 117* 10/03/2012 0550   BUN 21 10/03/2012 0550   CREATININE 1.18 10/03/2012 0550   CALCIUM 8.4 10/03/2012 0550   PROT 7.2 10/01/2012 2003   ALBUMIN 3.1* 10/01/2012 2003   AST 23 10/01/2012 2003   ALT 35 10/01/2012 2003   ALKPHOS 53 10/01/2012 2003   BILITOT 0.6 10/01/2012 2003   GFRNONAA 57* 10/03/2012 0550   GFRAA 66* 10/03/2012 0550   Lipase     Component Value Date/Time   LIPASE 25 10/02/2012 1355       Studies/Results: Dg Chest 2 View  10/01/2012  *RADIOLOGY REPORT*  Clinical Data: Rectal bleeding.  Abdominal pain.  Cholecystectomy on 09/27/2012  CHEST - 2 VIEW   Comparison: Chest x-ray dated 09/24/2012  Findings: There is free air under the diaphragm, probably secondary to the recent cholecystectomy.  There are no acute infiltrates or effusions.  Chronic accentuation of the interstitial markings particularly at the bases.  Prior CABG.  No acute osseous abnormality.  IMPRESSION: No acute disease in the chest.  Free air under the diaphragm consistent with recent abdominal surgery.   Original Report Authenticated By: Francene Boyers, M.D.    Ct Abdomen Pelvis W Contrast  10/02/2012  *RADIOLOGY REPORT*  Clinical Data: Acute abdominal pain.  Recent history of laparoscopic cholecystectomy.  CT ABDOMEN AND PELVIS WITH CONTRAST  Technique:  Multidetector CT imaging of the abdomen and pelvis was performed following the standard protocol during bolus administration of intravenous contrast.  Contrast: OMNIPAQUE IOHEXOL 300 MG/ML  SOLN  Comparison: No priors.  Findings:  Lung Bases: Extensive bilateral lower lobe septal thickening, peribronchovascular interstitial prominence and airspace disease. Heart size is mildly enlarged. There is no significant pericardial fluid, thickening or pericardial calcification.  Atherosclerotic calcifications are noted in the left anterior descending, left circumflex and right coronary arteries.  Status post median sternotomy for CABG.  Abdomen/Pelvis:  Postoperative changes of recent cholecystectomy are noted with a small amount  of soft tissue stranding in the gallbladder fossa.  No abnormal fluid collection in the gallbladder fossa is noted at this time to suggest the presence of a biloma or a developing abscess.  There is a focal area of decreased attenuation in segment 4A of the liver which may represent a perfusion anomaly or focal fat.  The liver has a slightly shrunken appearance and nodular contour, compatible with early changes of cirrhosis.  The appearance of the pancreas, spleen and bilateral adrenal glands is unremarkable.  There are  several subcentimeter low attenuation lesions in the kidneys bilaterally which are too small to definitively characterize.  Additionally, there are patchy areas of parenchymal thinning in the kidneys bilaterally, particularly in the posterior aspect of the right kidney, compatible with scarring related to prior infarctions or infection.  There is a moderate volume of pneumoperitoneum, predominately in and around the gallbladder fossa as well as anterior to the liver. There is also some gas tracking in the anterior abdominal wall on the right side.  There is a trace volume of ascites, as well as stranding around portions of the colon, particularly in the left pericolic gutter.  The entire colon appears thickened and hypervascular, strongly suggestive of a colitis.  No pathologic distension of small bowel.  There are reactive sized mesenteric and retroperitoneal lymph nodes noted.  Severe atherosclerosis of the abdominal and pelvic vasculature, including what appears to be a high-grade stenosis or complete occlusion of the superficial femoral arteries bilaterally.  Prostate is heavily calcified but otherwise unremarkable in appearance.  Urinary bladder is normal in appearance.  Musculoskeletal: There are no aggressive appearing lytic or blastic lesions noted in the visualized portions of the skeleton.  IMPRESSION: 1.  Findings, as above, compatible with severe colitis. 2.  Pneumoperitoneum and subcutaneous emphysema in the anterior right abdominal wall are presumably postoperative in this patient with recent history of laparoscopic cholecystectomy. 3.  Extensive bibasilar interstitial and airspace disease, particularly in the lower lobes, concerning for sequela of recent aspiration.  Clinical correlation is recommended. 4.  Severe atherosclerosis, including multivessel coronary artery disease in this patient status post CABG.  Notably, there appears to be either high-grade stenosis or complete occlusion of the  superficial femoral arteries bilaterally. 4.  The appearance of the liver suggests early changes of cirrhosis, as above. 5.  Additional incidental findings, as above.   Original Report Authenticated By: Trudie Reed, M.D.     Anti-infectives: Anti-infectives   Start     Dose/Rate Route Frequency Ordered Stop   10/02/12 1700  piperacillin-tazobactam (ZOSYN) IVPB 3.375 g     3.375 g 12.5 mL/hr over 240 Minutes Intravenous Every 8 hours 10/02/12 1655     10/02/12 0100  metroNIDAZOLE (FLAGYL) tablet 500 mg  Status:  Discontinued     500 mg Oral 3 times per day 10/02/12 0023 10/02/12 1631       Assessment/Plan  1. S/p lap chole 2. pancolitis  Plan: 1. Continue conservative treatment.   LOS: 2 days    Jezlyn Westerfield E 10/03/2012, 3:51 PM Pager: [phone]

## 2012-10-04 ENCOUNTER — Inpatient Hospital Stay (HOSPITAL_COMMUNITY): Payer: Medicaid Other

## 2012-10-04 DIAGNOSIS — R1084 Generalized abdominal pain: Secondary | ICD-10-CM

## 2012-10-04 DIAGNOSIS — R112 Nausea with vomiting, unspecified: Secondary | ICD-10-CM

## 2012-10-04 LAB — CBC
MCH: 28.8 pg (ref 26.0–34.0)
MCV: 87.8 fL (ref 78.0–100.0)
Platelets: 188 10*3/uL (ref 150–400)
RDW: 14.7 % (ref 11.5–15.5)

## 2012-10-04 LAB — COMPREHENSIVE METABOLIC PANEL
AST: 39 U/L — ABNORMAL HIGH (ref 0–37)
Albumin: 2.3 g/dL — ABNORMAL LOW (ref 3.5–5.2)
CO2: 23 mEq/L (ref 19–32)
Calcium: 8 mg/dL — ABNORMAL LOW (ref 8.4–10.5)
Creatinine, Ser: 1.19 mg/dL (ref 0.50–1.35)
GFR calc non Af Amer: 57 mL/min — ABNORMAL LOW (ref >90)

## 2012-10-04 LAB — GLUCOSE, CAPILLARY
Glucose-Capillary: 106 mg/dL — ABNORMAL HIGH (ref 70–99)
Glucose-Capillary: 136 mg/dL — ABNORMAL HIGH (ref 70–99)

## 2012-10-04 MED ORDER — ENSURE PUDDING PO PUDG
1.0000 | Freq: Three times a day (TID) | ORAL | Status: DC
  Administered 2012-10-05 – 2012-10-07 (×8): 1 via ORAL

## 2012-10-04 MED ORDER — DOCUSATE SODIUM 100 MG PO CAPS: 100.0000 mg | ORAL_CAPSULE | Freq: Two times a day (BID) | ORAL | Status: DC | PRN

## 2012-10-04 MED ORDER — AMLODIPINE BESYLATE 10 MG PO TABS
10.0000 mg | ORAL_TABLET | Freq: Every day | ORAL | Status: DC
  Administered 2012-10-04 – 2012-10-07 (×4): 10 mg via ORAL
  Filled 2012-10-04 (×4): qty 1

## 2012-10-04 NOTE — Progress Notes (Signed)
Agree Aubert Choyce, MD, MPH, FACS Pager: [phone]  

## 2012-10-04 NOTE — Progress Notes (Addendum)
[facility] Daily Rounding Note 10/04/2012, 9:49 AM  SUBJECTIVE:       3 bloody stools overnight, one this AM.  This is not melena, but is orangey/red brown and loose.  Volume is moderate per RN.  Not a lot of abdominal pain.  No nausea.  Tolerating full liquids.   OBJECTIVE:         Vital signs in last 24 hours:    Temp:  [97.6 F (36.4 C)-98.9 F (37.2 C)] 97.6 F (36.4 C) (03/27 0900) Pulse Rate:  [81-101] 101 (03/27 0900) Resp:  [18-20] 18 (03/27 0900) BP: (153-179)/(56-74) 179/62 mmHg (03/27 0900) SpO2:  [98 %-99 %] 99 % (03/27 0900) Weight:  [68.584 kg (151 lb 3.2 oz)] 68.584 kg (151 lb 3.2 oz) (03/26 2013) Last BM Date: 10/03/12 General: Alert, pleasant Heart: RRR Chest: clear B Abdomen: incisions C/D/I.  No tenderness.  Extremities: no CCE Neuro/Psych:  Pleasant, cooperative.    Intake/Output from previous day: 03/26 0701 - 03/27 0700 In: 480 [P.O.:480] Out: 2400 [Urine:2400]  Intake/Output this shift: Total I/O In: 60 [P.O.:60] Out: 451 [Urine:450; Stool:1]  Lab Results:  Recent Labs  10/02/12 2157 10/03/12 0550 10/04/12 0633  WBC 25.8* 21.8* 17.9*  HGB 12.5* 11.3* 11.1*  HCT 37.2* 34.2* 33.9*  PLT 214 198 188  MCV    87  BMET  Recent Labs  10/02/12 0540 10/03/12 0550 10/04/12 0633  NA 137 135 136  K 4.1 3.7 3.7  CL 101 103 101  CO2 22 24 23   GLUCOSE 227* 117* 102*  BUN 31* 21 13  CREATININE 1.20 1.18 1.19  CALCIUM 9.4 8.4 8.0*   LFT  Recent Labs  10/01/12 2003 10/04/12 0633  PROT 7.2 5.6*  ALBUMIN 3.1* 2.3*  AST 23 39*  ALT 35 38  ALKPHOS 53 64  BILITOT 0.6 0.7   PT/INR  Recent Labs  10/01/12 2003  LABPROT 20.9*  INR 1.88*   Scheduled Meds: . atorvastatin  20 mg Oral q1800  . digoxin  250 mcg Oral Daily  . enalapril  5 mg Oral Daily  . insulin aspart  0-5 Units Subcutaneous QHS  . insulin aspart  0-9 Units Subcutaneous TID WC  . insulin detemir  5 Units Subcutaneous Daily  . metoprolol succinate  50 mg Oral BID   . piperacillin-tazobactam  3.375 g Intravenous Q8H  . sodium chloride  3 mL Intravenous Q12H   Continuous Infusions: . sodium chloride 1,000 mL (10/02/12 1428)   PRN Meds:.ALPRAZolam, labetalol, morphine injection, ondansetron (ZOFRAN) IV, ondansetron  Studies/Results: Ct Abdomen Pelvis W Contrast 10/02/2012   IMPRESSION: 1.  Findings, as above, compatible with severe colitis. 2.  Pneumoperitoneum and subcutaneous emphysema in the anterior right abdominal wall are presumably postoperative in this patient with recent history of laparoscopic cholecystectomy. 3.  Extensive bibasilar interstitial and airspace disease, particularly in the lower lobes, concerning for sequela of recent aspiration.  Clinical correlation is recommended. 4.  Severe atherosclerosis, including multivessel coronary artery disease in this patient status post CABG.  Notably, there appears to be either high-grade stenosis or complete occlusion of the superficial femoral arteries bilaterally. 4.  The appearance of the liver suggests early changes of cirrhosis, as above. 5.  Additional incidental findings, as above.   Original Report Authenticated By: Trudie Reed, M.D.     ASSESMENT: * Acute colitis. C diff is negative.  Suspect ischemia, possible embolic from A fib. Infectious colitis also in ddx. Acute sxs of abd pain and  nausea persist but bloody stool continues.  WBCs improved.  Stool pathogen PCR panel is pending.  * Pneumoperitoneum. Surgeon's feeling is this is post lap chole finding.  *  Lap chole on 3/20; presented with biliary pancreatitis.  * Pneumonia, aspiration suspected. On zosyn day 3  * Atherosclerosis. CABG in 2003. PVD by CT scan.  * Cirrhotic liver by CT. By ultrasound the liver looks normal. Pt does not drink ETOH  * Type 2 DM.  * Atrial fibrilation. On chronic Coumadin. RVR during recent admission with BB added; rapid rate at this admission improved.    PLAN: *  Supportive care   LOS: 3 days    Jennye Moccasin  10/04/2012, 9:49 AM Pager: [phone]   GI ATTENDING  Interval history and laboratories reviewed. Patient personally seen and examined. Agree with above as outlined. Patient looks better this morning. His abdominal exam is benign. Less in the way of bloody stools. I reviewed the CT scan again this morning with the radiologist. Though he has a lot of vascular disease, no obvious high-grade obstruction of the SMA. Continue with supportive care. Will follow  Wilhemina Bonito. Eda Keys., M.D. [facility] Division of Gastroenterology

## 2012-10-04 NOTE — Progress Notes (Signed)
[facility] PROGRESS NOTE  [name] ZOX:[id number] DOB: Oct 09, 1931 DOA: 10/01/2012 PCP: Pcp Not In System  Assessment/Plan: Principal Problem:   Nausea, vomiting, and diarrhea Active Problems:   HYPERTENSION, BENIGN   PVD WITH CLAUDICATION   CAD (coronary artery disease)   Rectal bleeding   Atrial fibrillation with RVR   Diabetes mellitus type 2, uncontrolled   Noninfectious gastroenteritis and colitis    1. Acute colitis: Patient presented with nausea, vomiting, abdominal pain, diarrhea with blood in stools. C. difficile PCR negative. Abdominal/pelvic CT scan revealed findings, compatible with severe colitis. There was pneumoperitoneum and subcutaneous emphysema in the anterior right abdominal wall are presumably postoperative in this patient with recent history of laparoscopic cholecystectomy. Etiologies include infective, vs ischemic colitis. I favor the latter, against a known background of atrial fibrillation and PVD. Mananing with bowel rest and supportive treatment. Patient was originally treated with Flagyl, but switched to iv Zosyn from 10/02/12. Clinically, he appears better, but is still having bloody diarrhea, albeit less, and a persistent leukocytosis. Dr Yancey Flemings provided GI consultation, and we are managing as recommended. Of note, surgical team paid a visit on 10/03/12, in view of pneumoperitoneum, seen on CT scan. Per surgeons, this is not unexpected, and is not concerning. Tolerating clears.  2. Atrial fibrillation with RVR: Patient was in rapid atrial fibrillation at presentation. Heart rate is now controlled on controlled on Digoxin and Metoprolol. Anticoagulation held, secondary to GI bleed. 3. Uncontrolled type 2 diabetes mellitus: Oral hypoglycemics are on hold. Managing with Levemir/SSI, adjusted as indicated. HBA1c was 9 on 09/25/12. CBGs are reasonable.  4. Hypertension: This was initially uncontrolled, due to pain, nausea and vomiting. Appears to have  responded to beta-blocker and prn labetalol, but still sub-optimally controlled. Have added Norvasc, today. . 5. Acute renal failure: Creatinine was 1.40 at presentation, BUN 34, against a known baseline creatinine of 1.22 on 09/28/12. This is AKI, likely secondary to dehydration from problem #1. Improved with iv fluids. Creatinine was 1.18 on 10/03/12. Following renal indices 6. ? Aspiration due to vomiting: This is suggested by abdominal CT  findings, which revealed extensive bibasilar interstitial and airspace disease, particularly in the lower lobes, concerning for sequela of recent aspiration.  Adequately addressed with Zosyn. Patient currently has no cough, pyrexia or SOB.   Code Status: Full Code.  Family Communication:  Disposition Plan: To be determined.    Brief narrative: 79 year old male, [place]nian speaking, with history of A. fib on chronic anticoagulation, HTN, DM, CAD, CABG, PAD who was discharged on 09/29/12 following laparoscopic cholecystectomy on 09/27/12. Nuclear stress test at that admission was negative. He was admitted again on 10/01/12 with complaints of nausea, vomiting, abdominal pain, diarrhea mixed with blood which started a day prior to admission. In the ED, A. fib with RVR, WBC 23K, INR 1.8, hemoglobin 13. Patient admitted for further evaluation and management.   Consultants:  [facility].   Dr Violeta Gelinas, surgery.   Procedures:  CXR.  Abdominal?pelvic CT.   Antibiotics:  Flagyl 10/01/12-10/02/12.   Zosyn 10/02/12>>>  HPI/Subjective: Feels better. Diarrhea persists, albeit, less.   Objective: Vital signs in last 24 hours: Temp:  [97.6 F (36.4 C)-98.9 F (37.2 C)] 97.6 F (36.4 C) (03/27 0900) Pulse Rate:  [81-101] 101 (03/27 0900) Resp:  [18-20] 18 (03/27 0900) BP: (155-179)/(56-74) 179/62 mmHg (03/27 0900) SpO2:  [98 %-99 %] 99 % (03/27 0900) Weight:  [68.584 kg (151 lb 3.2 oz)] 68.584 kg (151 lb 3.2 oz) (03/26 2013) Weight  change: 1.784 kg (3  lb 14.9 oz) Last BM Date: 10/03/12  Intake/Output from previous day: 03/26 0701 - 03/27 0700 In: 480 [P.O.:480] Out: 2400 [Urine:2400] Total I/O In: 60 [P.O.:60] Out: 451 [Urine:450; Stool:1]   Physical Exam: General: Comfortable, alert, communicative, not short of breath at rest.  HEENT:  Mild clinical pallor, no jaundice, no conjunctival injection or discharge. Hydration is satisfactory.  NECK:  Supple, JVP not seen, no carotid bruits, no palpable lymphadenopathy, no palpable goiter. CHEST:  Clinically clear to auscultation, no wheezes. Coarse rales in left base.  HEART:  Sounds 1 and 2 heard, normal, irregular, no murmurs. ABDOMEN:  Full, soft, healed laparoscopic wounds, only mild tenderness, no palpable organomegaly, no palpable masses, normal bowel sounds. GENITALIA:  Not examined. LOWER EXTREMITIES:  No pitting edema, palpable peripheral pulses. MUSCULOSKELETAL SYSTEM:  Generalized osteoarthritic changes, otherwise, normal. CENTRAL NERVOUS SYSTEM:  No focal neurologic deficit on gross examination.  Lab Results:  Recent Labs  10/03/12 0550 10/04/12 0633  WBC 21.8* 17.9*  HGB 11.3* 11.1*  HCT 34.2* 33.9*  PLT 198 188    Recent Labs  10/03/12 0550 10/04/12 0633  NA 135 136  K 3.7 3.7  CL 103 101  CO2 24 23  GLUCOSE 117* 102*  BUN 21 13  CREATININE 1.18 1.19  CALCIUM 8.4 8.0*   Recent Results (from the past 240 hour(s))  CLOSTRIDIUM DIFFICILE BY PCR     Status: None   Collection Time    10/02/12 11:05 AM      Result Value Range Status   C difficile by pcr NEGATIVE  NEGATIVE Final     Studies/Results: Ct Abdomen Pelvis W Contrast  10/04/2012  **ADDENDUM** CREATED: 10/04/2012 12:27:42  As stated in the report, there is diffuse severe atherosclerosis. The SMA origin is patent.  The celiac trunk appears to remain patent.  The IMA is diminutive and calcified, but probably is patent proximally.  **END ADDENDUM** SIGNED BY: Harley Hallmark, M.D.   10/02/2012   *RADIOLOGY REPORT*  Clinical Data: Acute abdominal pain.  Recent history of laparoscopic cholecystectomy.  CT ABDOMEN AND PELVIS WITH CONTRAST  Technique:  Multidetector CT imaging of the abdomen and pelvis was performed following the standard protocol during bolus administration of intravenous contrast.  Contrast: OMNIPAQUE IOHEXOL 300 MG/ML  SOLN  Comparison: No priors.  Findings:  Lung Bases: Extensive bilateral lower lobe septal thickening, peribronchovascular interstitial prominence and airspace disease. Heart size is mildly enlarged. There is no significant pericardial fluid, thickening or pericardial calcification.  Atherosclerotic calcifications are noted in the left anterior descending, left circumflex and right coronary arteries.  Status post median sternotomy for CABG.  Abdomen/Pelvis:  Postoperative changes of recent cholecystectomy are noted with a small amount of soft tissue stranding in the gallbladder fossa.  No abnormal fluid collection in the gallbladder fossa is noted at this time to suggest the presence of a biloma or a developing abscess.  There is a focal area of decreased attenuation in segment 4A of the liver which may represent a perfusion anomaly or focal fat.  The liver has a slightly shrunken appearance and nodular contour, compatible with early changes of cirrhosis.  The appearance of the pancreas, spleen and bilateral adrenal glands is unremarkable.  There are several subcentimeter low attenuation lesions in the kidneys bilaterally which are too small to definitively characterize.  Additionally, there are patchy areas of parenchymal thinning in the kidneys bilaterally, particularly in the posterior aspect of the right kidney, compatible  with scarring related to prior infarctions or infection.  There is a moderate volume of pneumoperitoneum, predominately in and around the gallbladder fossa as well as anterior to the liver. There is also some gas tracking in the anterior abdominal  wall on the right side.  There is a trace volume of ascites, as well as stranding around portions of the colon, particularly in the left pericolic gutter.  The entire colon appears thickened and hypervascular, strongly suggestive of a colitis.  No pathologic distension of small bowel.  There are reactive sized mesenteric and retroperitoneal lymph nodes noted.  Severe atherosclerosis of the abdominal and pelvic vasculature, including what appears to be a high-grade stenosis or complete occlusion of the superficial femoral arteries bilaterally.  Prostate is heavily calcified but otherwise unremarkable in appearance.  Urinary bladder is normal in appearance.  Musculoskeletal: There are no aggressive appearing lytic or blastic lesions noted in the visualized portions of the skeleton.  IMPRESSION: 1.  Findings, as above, compatible with severe colitis. 2.  Pneumoperitoneum and subcutaneous emphysema in the anterior right abdominal wall are presumably postoperative in this patient with recent history of laparoscopic cholecystectomy. 3.  Extensive bibasilar interstitial and airspace disease, particularly in the lower lobes, concerning for sequela of recent aspiration.  Clinical correlation is recommended. 4.  Severe atherosclerosis, including multivessel coronary artery disease in this patient status post CABG.  Notably, there appears to be either high-grade stenosis or complete occlusion of the superficial femoral arteries bilaterally. 4.  The appearance of the liver suggests early changes of cirrhosis, as above. 5.  Additional incidental findings, as above.   Original Report Authenticated By: Trudie Reed, M.D.     Medications: Scheduled Meds: . atorvastatin  20 mg Oral q1800  . digoxin  250 mcg Oral Daily  . enalapril  5 mg Oral Daily  . insulin aspart  0-5 Units Subcutaneous QHS  . insulin aspart  0-9 Units Subcutaneous TID WC  . insulin detemir  5 Units Subcutaneous Daily  . metoprolol succinate  50 mg  Oral BID  . piperacillin-tazobactam  3.375 g Intravenous Q8H  . sodium chloride  3 mL Intravenous Q12H   Continuous Infusions: . sodium chloride 1,000 mL (10/02/12 1428)   PRN Meds:.ALPRAZolam, labetalol, morphine injection, ondansetron (ZOFRAN) IV, ondansetron    LOS: 3 days   Nickholas Goldston,CHRISTOPHER  [facility] Pager [phone]. If 8PM-8AM, please contact night-coverage at [email or web address], password [facility] 10/04/2012, 1:07 PM  LOS: 3 days

## 2012-10-04 NOTE — Progress Notes (Signed)
Subjective: Pt's abdominal pain is much improved.  Pt is having BM's, +flatus, and urinating well.  He is tolerating the clears well, but his appetite is low.  Pt has been ambulating very little and hasn't been using his IS.  Pt states he doesn't like to walk because he feels very weak and dizzy and his b/l legs hurt when he walks more than just to the bathroom.  This issue has been going on for about a year.  He notes some SOB as well.    Objective: Vital signs in last 24 hours: Temp:  [97.6 F (36.4 C)-98.9 F (37.2 C)] 97.6 F (36.4 C) (03/27 0900) Pulse Rate:  [81-101] 101 (03/27 0900) Resp:  [18-20] 18 (03/27 0900) BP: (155-179)/(56-74) 179/62 mmHg (03/27 0900) SpO2:  [98 %-99 %] 99 % (03/27 0900) Weight:  [151 lb 3.2 oz (68.584 kg)] 151 lb 3.2 oz (68.584 kg) (03/26 2013) Last BM Date: 10/03/12  Intake/Output from previous day: 03/26 0701 - 03/27 0700 In: 480 [P.O.:480] Out: 2400 [Urine:2400] Intake/Output this shift: Total I/O In: 60 [P.O.:60] Out: 451 [Urine:450; Stool:1]  PE: Gen:  Alert, NAD, pleasant Lungs:  B/l lower lobes with wheezes and rhonchi, good effort, doesn't appear significantly SOB Cards:  RRR no M/G/R audible Abd: Soft, NT/ND, +BS, no HSM, incisions C/D/I, drain with minimal sanguinous drainage IS @1500  Ext:  No edema, ecchymosis, or signs of infection, neg homans sign  Lab Results:   Recent Labs  10/03/12 0550 10/04/12 0633  WBC 21.8* 17.9*  HGB 11.3* 11.1*  HCT 34.2* 33.9*  PLT 198 188   BMET  Recent Labs  10/03/12 0550 10/04/12 0633  NA 135 136  K 3.7 3.7  CL 103 101  CO2 24 23  GLUCOSE 117* 102*  BUN 21 13  CREATININE 1.18 1.19  CALCIUM 8.4 8.0*   PT/INR  Recent Labs  10/01/12 2003  LABPROT 20.9*  INR 1.88*   CMP     Component Value Date/Time   NA 136 10/04/2012 0633   K 3.7 10/04/2012 0633   CL 101 10/04/2012 0633   CO2 23 10/04/2012 0633   GLUCOSE 102* 10/04/2012 0633   BUN 13 10/04/2012 0633   CREATININE 1.19  10/04/2012 0633   CALCIUM 8.0* 10/04/2012 0633   PROT 5.6* 10/04/2012 0633   ALBUMIN 2.3* 10/04/2012 0633   AST 39* 10/04/2012 0633   ALT 38 10/04/2012 0633   ALKPHOS 64 10/04/2012 0633   BILITOT 0.7 10/04/2012 0633   GFRNONAA 57* 10/04/2012 0633   GFRAA 66* 10/04/2012 0633   Lipase     Component Value Date/Time   LIPASE 25 10/02/2012 1355       Studies/Results: Ct Abdomen Pelvis W Contrast  10/02/2012  *RADIOLOGY REPORT*  Clinical Data: Acute abdominal pain.  Recent history of laparoscopic cholecystectomy.  CT ABDOMEN AND PELVIS WITH CONTRAST  Technique:  Multidetector CT imaging of the abdomen and pelvis was performed following the standard protocol during bolus administration of intravenous contrast.  Contrast: OMNIPAQUE IOHEXOL 300 MG/ML  SOLN  Comparison: No priors.  Findings:  Lung Bases: Extensive bilateral lower lobe septal thickening, peribronchovascular interstitial prominence and airspace disease. Heart size is mildly enlarged. There is no significant pericardial fluid, thickening or pericardial calcification.  Atherosclerotic calcifications are noted in the left anterior descending, left circumflex and right coronary arteries.  Status post median sternotomy for CABG.  Abdomen/Pelvis:  Postoperative changes of recent cholecystectomy are noted with a small amount of soft tissue stranding in the  gallbladder fossa.  No abnormal fluid collection in the gallbladder fossa is noted at this time to suggest the presence of a biloma or a developing abscess.  There is a focal area of decreased attenuation in segment 4A of the liver which may represent a perfusion anomaly or focal fat.  The liver has a slightly shrunken appearance and nodular contour, compatible with early changes of cirrhosis.  The appearance of the pancreas, spleen and bilateral adrenal glands is unremarkable.  There are several subcentimeter low attenuation lesions in the kidneys bilaterally which are too small to definitively  characterize.  Additionally, there are patchy areas of parenchymal thinning in the kidneys bilaterally, particularly in the posterior aspect of the right kidney, compatible with scarring related to prior infarctions or infection.  There is a moderate volume of pneumoperitoneum, predominately in and around the gallbladder fossa as well as anterior to the liver. There is also some gas tracking in the anterior abdominal wall on the right side.  There is a trace volume of ascites, as well as stranding around portions of the colon, particularly in the left pericolic gutter.  The entire colon appears thickened and hypervascular, strongly suggestive of a colitis.  No pathologic distension of small bowel.  There are reactive sized mesenteric and retroperitoneal lymph nodes noted.  Severe atherosclerosis of the abdominal and pelvic vasculature, including what appears to be a high-grade stenosis or complete occlusion of the superficial femoral arteries bilaterally.  Prostate is heavily calcified but otherwise unremarkable in appearance.  Urinary bladder is normal in appearance.  Musculoskeletal: There are no aggressive appearing lytic or blastic lesions noted in the visualized portions of the skeleton.  IMPRESSION: 1.  Findings, as above, compatible with severe colitis. 2.  Pneumoperitoneum and subcutaneous emphysema in the anterior right abdominal wall are presumably postoperative in this patient with recent history of laparoscopic cholecystectomy. 3.  Extensive bibasilar interstitial and airspace disease, particularly in the lower lobes, concerning for sequela of recent aspiration.  Clinical correlation is recommended. 4.  Severe atherosclerosis, including multivessel coronary artery disease in this patient status post CABG.  Notably, there appears to be either high-grade stenosis or complete occlusion of the superficial femoral arteries bilaterally. 4.  The appearance of the liver suggests early changes of cirrhosis, as  above. 5.  Additional incidental findings, as above.   Original Report Authenticated By: Trudie Reed, M.D.     Anti-infectives: Anti-infectives   Start     Dose/Rate Route Frequency Ordered Stop   10/02/12 1700  piperacillin-tazobactam (ZOSYN) IVPB 3.375 g     3.375 g 12.5 mL/hr over 240 Minutes Intravenous Every 8 hours 10/02/12 1655     10/02/12 0100  metroNIDAZOLE (FLAGYL) tablet 500 mg  Status:  Discontinued     500 mg Oral 3 times per day 10/02/12 0023 10/02/12 1631       Assessment/Plan 1. S/p lap chole  2. Pancolitis  3.  SOB, wheezing/rhonchi - will leave this to primary service 4.  Dizziness, weakness - will ask the medicine service to further investigate this either IP or OP  Plan:  1.  Continue conservative treatment of pancolitis 2.  Can likely go home soon when medically stable 3.  Advance diet as tolerated 4.  Obtain CXR, already on Zosyn 5.  Ambulate the patient TID, PT/OT 6.  Will sign off today, call us with questions     LOS: 3 days    DORT, [facility] 10/04/2012, 10:07 AM Pager: [phone]

## 2012-10-05 LAB — GLUCOSE, CAPILLARY
Glucose-Capillary: 101 mg/dL — ABNORMAL HIGH (ref 70–99)
Glucose-Capillary: 172 mg/dL — ABNORMAL HIGH (ref 70–99)
Glucose-Capillary: 200 mg/dL — ABNORMAL HIGH (ref 70–99)
Glucose-Capillary: 166 mg/dL — ABNORMAL HIGH (ref 70–99)

## 2012-10-05 LAB — GI PATHOGEN PANEL BY PCR, STOOL
Campylobacter by PCR: NEGATIVE
E coli (STEC): NEGATIVE
Norovirus GI/GII: NEGATIVE
Rotavirus A by PCR: NEGATIVE
Salmonella by PCR: NEGATIVE
Shigella by PCR: NEGATIVE

## 2012-10-05 LAB — BASIC METABOLIC PANEL
BUN: 10 mg/dL (ref 6–23)
CO2: 26 mEq/L (ref 19–32)
Calcium: 8.3 mg/dL — ABNORMAL LOW (ref 8.4–10.5)
GFR calc non Af Amer: 59 mL/min — ABNORMAL LOW (ref >90)
Glucose, Bld: 101 mg/dL — ABNORMAL HIGH (ref 70–99)

## 2012-10-05 LAB — CBC
HCT: 33 % — ABNORMAL LOW (ref 39.0–52.0)
Hemoglobin: 11.2 g/dL — ABNORMAL LOW (ref 13.0–17.0)
MCH: 28.7 pg (ref 26.0–34.0)
MCHC: 33.9 g/dL (ref 30.0–36.0)
MCV: 84.6 fL (ref 78.0–100.0)

## 2012-10-05 LAB — HEPATITIS PANEL, ACUTE
HCV Ab: NEGATIVE
Hep A IgM: NEGATIVE
Hepatitis B Surface Ag: NEGATIVE

## 2012-10-05 LAB — ANTI-NUCLEAR AB-TITER (ANA TITER): ANA Titer 1: NEGATIVE

## 2012-10-05 LAB — ANA: Anti Nuclear Antibody(ANA): POSITIVE — AB

## 2012-10-05 MED ORDER — POTASSIUM CHLORIDE CRYS ER 20 MEQ PO TBCR
40.0000 meq | EXTENDED_RELEASE_TABLET | Freq: Every day | ORAL | Status: DC
  Administered 2012-10-05 – 2012-10-07 (×3): 40 meq via ORAL
  Filled 2012-10-05 (×3): qty 2

## 2012-10-05 NOTE — Progress Notes (Addendum)
Received text page that patient's HR 38. HR apically 64. Dr. Burnadette Peter notified. No new orders. Will continue to monitor.

## 2012-10-05 NOTE — Progress Notes (Signed)
Daughter called multiple times with questions regarding pt status and requesting to know when MD will be in room. Daughters number on whiteboard for MD to call when rounding to update. Will continue to monitor.

## 2012-10-05 NOTE — Consult Note (Signed)
ANTIBIOTIC CONSULT NOTE - Follow-up  Pharmacy Consult for Zosyn Indication: Severe Colitis, Bibasilar PNA   Hospital Problems: Principal Problem:   Nausea, vomiting, and diarrhea Active Problems:   HYPERTENSION, BENIGN   PVD WITH CLAUDICATION   CAD (coronary artery disease)   Rectal bleeding   Atrial fibrillation with RVR   Diabetes mellitus type 2, uncontrolled   Noninfectious gastroenteritis and colitis   Allergies: Allergies  Allergen Reactions  . Percocet (Oxycodone-Acetaminophen) Other (See Comments)    "almost a heart attack"    Patient Measurements: Height: 5\' 6"  (167.6 cm) Weight: 150 lb 9.2 oz (68.3 kg) IBW/kg (Calculated) : 63.8  Vital Signs: BP 140/63  Pulse 80  Temp(Src) 98.2 F (36.8 C) (Oral)  Resp 18  Ht 5\' 6"  (1.676 m)  Wt 150 lb 9.2 oz (68.3 kg)  BMI 24.31 kg/m2  SpO2 97%  Labs:  Recent Labs  10/03/12 0550 10/04/12 0633 10/05/12 0650  WBC 21.8* 17.9* 12.0*  HGB 11.3* 11.1* 11.2*  PLT 198 188 212  CREATININE 1.18 1.19 1.15   Estimated Creatinine Clearance: 47.8 ml/min (by C-G formula based on Cr of 1.15).   Microbiology: Recent Results (from the past 720 hour(s))  CLOSTRIDIUM DIFFICILE BY PCR     Status: None   Collection Time    10/02/12 11:05 AM      Result Value Range Status   C difficile by pcr NEGATIVE  NEGATIVE Final    Medical/Surgical History: Past Medical History  Diagnosis Date  . Permanent atrial fibrillation   . Hypertension   . HNP (herniated nucleus pulposus), lumbar   . PVD (peripheral vascular disease)   . Diabetes mellitus   . CAD (coronary artery disease)     s/p CABG x 4  . Ventricular hypertrophy    Past Surgical History  Procedure Laterality Date  . Cardiac catheterization  01/2002    40-50% distal LM/ostial LAD disease, 50-60% ostial LAD, 60% mid LAD, 30-40% D1, 70% tubular mid LCx, 70% ostial disease in two ramus intermedius branches, 70% prox RCA, 50-60% mid RCA, 70% distal RCA, LVEF 40%   .  Coronary artery bypass graft  01/2002    x 4: LIMA-LAD, SVG-OM, SVG-PDA-PLB  . Cholecystectomy N/A 09/27/2012    Procedure: LAPAROSCOPIC CHOLECYSTECTOMY WITH INTRAOPERATIVE CHOLANGIOGRAM;  Surgeon: Lodema Pilot, DO;  Location: [facility] OR;  Service: General;  Laterality: N/A;    Medications:  Scheduled:  . amLODipine  10 mg Oral Daily  . atorvastatin  20 mg Oral q1800  . digoxin  250 mcg Oral Daily  . enalapril  5 mg Oral Daily  . feeding supplement  1 Container Oral TID BM  . insulin aspart  0-5 Units Subcutaneous QHS  . insulin aspart  0-9 Units Subcutaneous TID WC  . insulin detemir  5 Units Subcutaneous Daily  . metoprolol succinate  50 mg Oral BID  . piperacillin-tazobactam  3.375 g Intravenous Q8H  . sodium chloride  3 mL Intravenous Q12H   Anti-infectives   Start     Dose/Rate Route Frequency Ordered Stop   10/02/12 1700  piperacillin-tazobactam (ZOSYN) IVPB 3.375 g     3.375 g 12.5 mL/hr over 240 Minutes Intravenous Every 8 hours 10/02/12 1655     10/02/12 0100  metroNIDAZOLE (FLAGYL) tablet 500 mg  Status:  Discontinued     500 mg Oral 3 times per day 10/02/12 0023 10/02/12 1631      Assessment:  79 y/o male s/p lap chole last week admitted with bloody diarrhea.  CT positive for pancolitis and Bibasilar PNA, possible Aspiration.  On Zosyn 3.375g IV q 8 hrs (extended-interval infusion)  C.diff negative  Goal of Therapy:   Resolution of infection and normalization of WBC.  Plan:   Continue Zosyn 3.375 gm IV q 8 hours, each dose to infuse over 4 hours    F/U for LOT.  Tad Moore, BCPS  Clinical Pharmacist Pager [phone]  10/05/2012 10:32 AM

## 2012-10-05 NOTE — Evaluation (Signed)
Occupational Therapy Evaluation and Discharge Patient Details Name: [name] MRN: [id number] DOB: [birth year] Today's Date: 10/05/2012 Time: 4098-1191 OT Time Calculation (min): 16 min  OT Assessment / Plan / Recommendation Clinical Impression  This 79 yo male admitted with Rectal bleeding, loose bowel movements and abdominal pain presents to acute OT with all education completed. Acute OT will sign off.    OT Assessment  Patient does not need any further OT services    Follow Up Recommendations  No OT follow up       Equipment Recommendations  None recommended by OT          Precautions / Restrictions Precautions Precautions: None       ADL  Equipment Used: Rolling walker;Gait belt (pushing IV pole) Transfers/Ambulation Related to ADLs: Pt is Independent with transfers, Mod I with ambulation ADL Comments: Pt is I with all BADLs        Visit Information  Last OT Received On: 10/05/12 Assistance Needed: +1 PT/OT Co-Evaluation/Treatment: Yes (partial)    Subjective Data  Subjective: Daughter is just concerned about how her father is medically now that she sees physically that he is doing well   Prior Functioning     Home Living Lives With: Daughter Available Help at Discharge: Family;Available PRN/intermittently Type of Home: House Home Access: Stairs to enter [employer] of Steps: 2 Entrance Stairs-Rails: Left;Right Home Layout: Two level;Able to live on main level with bedroom/bathroom Alternate Level Stairs-Number of Steps: flight Alternate Level Stairs-Rails: Right;Left Bathroom Shower/Tub: Tub/shower unit;Curtain Bathroom Toilet: Standard Home Adaptive Equipment: Straight cane;Hand-held shower hose Prior Function Level of Independence: Independent with assistive device(s) Able to Take Stairs?: Yes Driving: No Vocation: Retired Comments: Had gall bladder removed 1 week ago. Communication Communication: Prefers language other than  English (Bosnian) Dominant Hand: Right            Cognition  Cognition Overall Cognitive Status: Difficult to assess Difficult to assess due to: Non-English speaking Arousal/Alertness: Awake/alert Behavior During Session: [facility] for tasks performed Cognition - Other Comments: Daughter reports no issues.    Extremity/Trunk Assessment Right Upper Extremity Assessment RUE ROM/Strength/Tone: Within functional levels Left Upper Extremity Assessment LUE ROM/Strength/Tone: Within functional levels Right Lower Extremity Assessment RLE ROM/Strength/Tone: WFL for tasks assessed Left Lower Extremity Assessment LLE ROM/Strength/Tone: [facility] for tasks assessed     Mobility Bed Mobility Supine to Sit: 6: Modified independent (Device/Increase time);HOB elevated Sitting - Scoot to Edge of Bed: 7: Independent Transfers Sit to Stand: 7: Independent Stand to Sit: 7: Independent        Balance Static Standing Balance Static Standing - Balance Support: No upper extremity supported Static Standing - Level of Assistance: 7: Independent   End of Session OT - End of Session Equipment Utilized During Treatment: Gait belt Activity Tolerance: Patient tolerated treatment well Patient left: in chair;with call bell/phone within reach;with family/visitor present       Evette Georges [phone] 10/05/2012, 3:25 PM

## 2012-10-05 NOTE — Progress Notes (Signed)
Pt daughter at bedside requesting to see MD. MD notified, will continue to monitor.

## 2012-10-05 NOTE — Progress Notes (Signed)
[facility] Daily Rounding Note 10/05/2012, 11:17 AM  SUBJECTIVE:  No abdominal pain or nausea.  No stools since yest AM, bloody or otherwise.         OBJECTIVE:         Vital signs in last 24 hours:    Temp:  [97.9 F (36.6 C)-98.4 F (36.9 C)] 98.2 F (36.8 C) (03/28 0909) Pulse Rate:  [73-80] 80 (03/28 0909) Resp:  [17-18] 18 (03/28 0909) BP: (140-169)/(53-97) 140/63 mmHg (03/28 0909) SpO2:  [95 %-100 %] 97 % (03/28 0909) Weight:  [68.3 kg (150 lb 9.2 oz)] 68.3 kg (150 lb 9.2 oz) (03/27 2149) Last BM Date: 10/04/12 General: looks chronically unwell.  No acute distress or discomfort.   Heart: RRR Chest: crackles in bases, right > left.  Some cough. Abdomen: soft, ND, NT.  Active BS  Extremities: no pedal edema Neuro/Psych:  Cooperative, not anxious or agitated.   Intake/Output from previous day: 03/27 0701 - 03/28 0700 In: 2905.4 [P.O.:540; I.V.:2015.4; IV Piggyback:350] Out: 1551 [Urine:1550; Stool:1]  Intake/Output this shift: Total I/O In: 240 [P.O.:240] Out: 2 [Urine:1; Stool:1]  Lab Results:  Recent Labs  10/03/12 0550 10/04/12 0633 10/05/12 0650  WBC 21.8* 17.9* 12.0*  HGB 11.3* 11.1* 11.2*  HCT 34.2* 33.9* 33.0*  PLT 198 188 212   BMET  Recent Labs  10/03/12 0550 10/04/12 0633 10/05/12 0650  NA 135 136 138  K 3.7 3.7 3.2*  CL 103 101 102  CO2 24 23 26   GLUCOSE 117* 102* 101*  BUN 21 13 10   CREATININE 1.18 1.19 1.15  CALCIUM 8.4 8.0* 8.3*   LFT  Recent Labs  10/04/12 0633  PROT 5.6*  ALBUMIN 2.3*  AST 39*  ALT 38  ALKPHOS 64  BILITOT 0.7   ANA positive     Studies/Results: Dg Chest Port 1 View 10/04/2012   IMPRESSION:  1.  Stable cardiomegaly with some interval increase in bibasilar interstitial opacities.   Original Report Authenticated By: D. Andria Rhein, MD     ASSESMENT: * Acute colitis. Bloody diarrhea and abdominal pain.  C diff is negative. Suspect ischemia, possible embolic from A fib. Infectious colitis also in  ddx. Acute sxs of abd pain and nausea persist but bloody stool continues. WBCs further improved.  Stool pathogen PCR panel still in process.  * Pneumoperitoneum. Surgeon's feeling is this is post lap chole finding.  * Lap chole on 3/20; presented with biliary pancreatitis.  * Pneumonia, aspiration suspected. On zosyn day 3  * Atherosclerosis. CABG in 2003. PVD by CT scan.  * Cirrhotic liver by CT. By ultrasound the liver looks normal. Pt does not drink ETOH.  Past consumption hx not defined.  ANA positive.  Still pending is hepatitis panel and ceruloplasmin.  * Type 2 DM.  * Atrial fibrilation. On chronic Coumadin. RVR during recent admission with BB added; rapid rate at this admission improved.     PLAN: *  Continue supportive care.  *  Agree with soft diet.    LOS: 4 days   Jennye Moccasin  10/05/2012, 11:17 AM Pager: [phone]  GI ATTENDING  Interval history and laboratories reviewed. Patient seen and examined. Daughter in room. Ask questions. Patient is doing well. Ambulating. Just moved his bowels which appeared normal. Abdominal exam is completely benign. White blood cell count near the normal range. Suspect the patient had acute ischemic colitis which is resolving nicely. Recommend advancing diet as tolerated. No further recommendations or plans for  GI. We are available as needed. Discussed with daughter. Will sign off  Janaa Acero N. Eda Keys., M.D. [facility] Division of Gastroenterology

## 2012-10-05 NOTE — Evaluation (Signed)
Physical Therapy Evaluation Patient Details Name: [name] MRN: [id number] DOB: 04/02/1932 Today's Date: 10/05/2012 Time: 1027-2536 PT Time Calculation (min): 11 min  PT Assessment / Plan / Recommendation Clinical Impression  Pt adm with acute colitis.  Recent cholecystectomy.  Pt moving fairly well and can return home with intermittent assist from family. Instructed pt in long arc quads, marching in sitting and standing minisquats at sink.  Daughter present and both acknowledge understanding.    PT Assessment  Patent does not need any further PT services    Follow Up Recommendations  No PT follow up    Does the patient have the potential to tolerate intense rehabilitation      Barriers to Discharge        Equipment Recommendations  None recommended by PT    Recommendations for Other Services     Frequency      Precautions / Restrictions Precautions Precautions: None   Pertinent Vitals/Pain No signs of pain.      Mobility  Bed Mobility Supine to Sit: 6: Modified independent (Device/Increase time);HOB elevated Sitting - Scoot to Edge of Bed: 7: Independent Transfers Sit to Stand: 7: Independent Stand to Sit: 7: Independent Ambulation/Gait Ambulation/Gait Assistance: 6: Modified independent (Device/Increase time) Ambulation Distance (Feet): 200 Feet Assistive device: Rolling walker;Other (Comment) (pushing IV pole) Gait Pattern: Step-through pattern;Decreased stride length Gait velocity: decr    Exercises     PT Diagnosis:    PT Problem List:   PT Treatment Interventions:     PT Goals    Visit Information  Last PT Received On: 10/05/12 Assistance Needed: +1    Subjective Data  Subjective: "Thank you," pt stated at end of session Patient Stated Goal: Daughter reports that she would like pt to come home.   Prior Functioning  Home Living Lives With: Daughter Available Help at Discharge: Family;Available PRN/intermittently Type of Home:  House Home Access: Stairs to enter [employer] of Steps: 2 Entrance Stairs-Rails: Left;Right Home Layout: Two level;Able to live on main level with bedroom/bathroom Alternate Level Stairs-Number of Steps: flight Alternate Level Stairs-Rails: Right;Left Bathroom Shower/Tub: Tub/shower unit;Curtain Bathroom Toilet: Standard Home Adaptive Equipment: Straight cane;Hand-held shower hose Prior Function Level of Independence: Independent with assistive device(s) Able to Take Stairs?: Yes Driving: No Vocation: Retired Comments: Had gall bladder removed 1 week ago. Communication Communication: Prefers language other than English (Bosnian) Dominant Hand: Right    Cognition  Cognition Overall Cognitive Status: Difficult to assess Difficult to assess due to: Non-English speaking Arousal/Alertness: Awake/alert Behavior During Session: [facility] for tasks performed Cognition - Other Comments: Daughter reports no issues.    Extremity/Trunk Assessment Right Upper Extremity Assessment RUE ROM/Strength/Tone: Within functional levels Left Upper Extremity Assessment LUE ROM/Strength/Tone: Within functional levels Right Lower Extremity Assessment RLE ROM/Strength/Tone: WFL for tasks assessed Left Lower Extremity Assessment LLE ROM/Strength/Tone: WFL for tasks assessed   Balance Static Standing Balance Static Standing - Balance Support: No upper extremity supported Static Standing - Level of Assistance: 7: Independent  End of Session PT - End of Session Equipment Utilized During Treatment: Gait belt Activity Tolerance: Patient tolerated treatment well Patient left: in chair;with call bell/phone within reach;with family/visitor present Nurse Communication: Mobility status  GP     Lilyana Lippman 10/05/2012, 3:25 PM  [employer] PT [phone]

## 2012-10-05 NOTE — Progress Notes (Signed)
Pt refusing to ambulate at present. States he will walk later.

## 2012-10-05 NOTE — Progress Notes (Signed)
Utilization review completed.  

## 2012-10-05 NOTE — Progress Notes (Signed)
[facility] PROGRESS NOTE  [name] WUJ:[id number] DOB: [birth year] DOA: 10/01/2012 PCP: Pcp Not In System  Assessment/Plan: Principal Problem:   Nausea, vomiting, and diarrhea Active Problems:   HYPERTENSION, BENIGN   PVD WITH CLAUDICATION   CAD (coronary artery disease)   Rectal bleeding   Atrial fibrillation with RVR   Diabetes mellitus type 2, uncontrolled   Noninfectious gastroenteritis and colitis    1. Acute colitis: Patient presented with nausea, vomiting, abdominal pain, diarrhea with blood in stools. C. difficile PCR negative. Abdominal/pelvic CT scan revealed findings, compatible with severe colitis. There was pneumoperitoneum and subcutaneous emphysema in the anterior right abdominal wall are presumably postoperative in this patient with recent history of laparoscopic cholecystectomy. Etiologies include infective, vs ischemic colitis. I favor the latter, against a known background of atrial fibrillation and PVD. Managing with bowel rest and supportive treatment. Patient was originally treated with Flagyl, but switched to iv Zosyn from 10/02/12, now day # 4. Dr Yancey Flemings provided GI consultation, and we are managing as recommended. Of note, surgical team paid a visit on 10/03/12, in view of pneumoperitoneum, seen on CT scan. Per surgeons, this is not unexpected, and is not concerning. Tolerated clears, and has been advanced to soft mechanical diet, without deleterious effects. No diarrhea today abdominal pain is gone, and wcc is trending down. Will likely discontinue antibiotics on 10/06/12.  2. Atrial fibrillation with RVR: Patient was in rapid atrial fibrillation at presentation. Heart rate is now controlled on controlled on Digoxin and Metoprolol. Anticoagulation held, secondary to GI bleed. 3. Uncontrolled type 2 diabetes mellitus: Oral hypoglycemics are on hold. Managing with Levemir/SSI, adjusted as indicated. HBA1c was 9 on 09/25/12. CBGs are reasonable.  4.  Hypertension: This was initially uncontrolled, due to pain, nausea and vomiting. Appears to have responded to beta-blocker and prn labetalol, but still sub-optimally controlled. Have added Norvasc on 10/04/12, with improved control. 5. Acute renal failure: Creatinine was 1.40 at presentation, BUN 34, against a known baseline creatinine of 1.22 on 09/28/12. This is AKI, likely secondary to dehydration from problem #1. Improved with iv fluids. Creatinine was 1.18 on 10/03/12. Following renal indices 6. ? Aspiration due to vomiting: This is suggested by abdominal CT  findings, which revealed extensive bibasilar interstitial and airspace disease, particularly in the lower lobes, concerning for sequela of recent aspiration. Adequately addressed with Zosyn. Patient currently has no cough, pyrexia or SOB. As described above, will plan to discontinue antibiotics on 10/06/12.   Code Status: Full Code.  Family Communication:  Disposition Plan: To be determined.    Brief narrative: 79 year old male, [place]nian speaking, with history of A. fib on chronic anticoagulation, HTN, DM, CAD, CABG, PAD who was discharged on 09/29/12 following laparoscopic cholecystectomy on 09/27/12. Nuclear stress test at that admission was negative. He was admitted again on 10/01/12 with complaints of nausea, vomiting, abdominal pain, diarrhea mixed with blood which started a day prior to admission. In the ED, A. fib with RVR, WBC 23K, INR 1.8, hemoglobin 13. Patient admitted for further evaluation and management.   Consultants:  [facility].   Dr Violeta Gelinas, surgery.   Procedures:  CXR.  Abdominal?pelvic CT.   Antibiotics:  Flagyl 10/01/12-10/02/12.   Zosyn 10/02/12>>>  HPI/Subjective: Feels better. No diarrhea today.    Objective: Vital signs in last 24 hours: Temp:  [98.2 F (36.8 C)-98.4 F (36.9 C)] 98.3 F (36.8 C) (03/28 1345) Pulse Rate:  [73-80] 75 (03/28 1345) Resp:  [17-19] 19 (03/28 1345) BP:  (140-169)/(53-97)  145/67 mmHg (03/28 1345) SpO2:  [97 %-100 %] 99 % (03/28 1345) Weight:  [68.3 kg (150 lb 9.2 oz)] 68.3 kg (150 lb 9.2 oz) (03/27 2149) Weight change: -0.284 kg (-10 oz) Last BM Date: 10/04/12  Intake/Output from previous day: 03/27 0701 - 03/28 0700 In: 2905.4 [P.O.:540; I.V.:2015.4; IV Piggyback:350] Out: 1551 [Urine:1550; Stool:1] Total I/O In: 290 [P.O.:240; IV Piggyback:50] Out: 802 [Urine:801; Stool:1]   Physical Exam: General: Comfortable, alert, communicative, not short of breath at rest.  HEENT:  Mild clinical pallor, no jaundice, no conjunctival injection or discharge. Hydration is satisfactory.  NECK:  Supple, JVP not seen, no carotid bruits, no palpable lymphadenopathy, no palpable goiter. CHEST:  Clinically clear to auscultation, no wheezes, no crackles.  HEART:  Sounds 1 and 2 heard, normal, irregular, no murmurs. ABDOMEN:  Full, soft, healed laparoscopic wounds, non-tender, no palpable organomegaly, no palpable masses, normal bowel sounds. GENITALIA:  Not examined. LOWER EXTREMITIES:  No pitting edema, palpable peripheral pulses. MUSCULOSKELETAL SYSTEM:  Generalized osteoarthritic changes, otherwise, normal. CENTRAL NERVOUS SYSTEM:  No focal neurologic deficit on gross examination.  Lab Results:  Recent Labs  10/04/12 0633 10/05/12 0650  WBC 17.9* 12.0*  HGB 11.1* 11.2*  HCT 33.9* 33.0*  PLT 188 212    Recent Labs  10/04/12 0633 10/05/12 0650  NA 136 138  K 3.7 3.2*  CL 101 102  CO2 23 26  GLUCOSE 102* 101*  BUN 13 10  CREATININE 1.19 1.15  CALCIUM 8.0* 8.3*   Recent Results (from the past 240 hour(s))  CLOSTRIDIUM DIFFICILE BY PCR     Status: None   Collection Time    10/02/12 11:05 AM      Result Value Range Status   C difficile by pcr NEGATIVE  NEGATIVE Final     Studies/Results: Dg Chest Port 1 View  10/04/2012  *RADIOLOGY REPORT*  Clinical Data: Wheezes  PORTABLE CHEST - 1 VIEW  Comparison: 10/01/2012  Findings:  Previous CABG.  The subdiaphragmatic gas seen on the prior study is no longer evident.  Mild cardiomegaly stable. Interval increase in mild bibasilar interstitial edema, infiltrates, or subsegmental atelectasis.  No definite effusion.  IMPRESSION:  1.  Stable cardiomegaly with some interval increase in bibasilar interstitial opacities.   Original Report Authenticated By: D. Andria Rhein, MD     Medications: Scheduled Meds: . amLODipine  10 mg Oral Daily  . atorvastatin  20 mg Oral q1800  . digoxin  250 mcg Oral Daily  . enalapril  5 mg Oral Daily  . feeding supplement  1 Container Oral TID BM  . insulin aspart  0-5 Units Subcutaneous QHS  . insulin aspart  0-9 Units Subcutaneous TID WC  . insulin detemir  5 Units Subcutaneous Daily  . metoprolol succinate  50 mg Oral BID  . piperacillin-tazobactam  3.375 g Intravenous Q8H  . potassium chloride  40 mEq Oral Daily  . sodium chloride  3 mL Intravenous Q12H   Continuous Infusions: . sodium chloride 50 mL/hr (10/05/12 1131)   PRN Meds:.ALPRAZolam, docusate sodium, labetalol, morphine injection, ondansetron (ZOFRAN) IV, ondansetron    LOS: 4 days   Rajat Staver,CHRISTOPHER  [facility] Pager [phone]. If 8PM-8AM, please contact night-coverage at [email or web address], password [facility] 10/05/2012, 2:43 PM  LOS: 4 days

## 2012-10-06 DIAGNOSIS — K559 Vascular disorder of intestine, unspecified: Secondary | ICD-10-CM | POA: Diagnosis present

## 2012-10-06 LAB — COMPREHENSIVE METABOLIC PANEL
Albumin: 2.5 g/dL — ABNORMAL LOW (ref 3.5–5.2)
Alkaline Phosphatase: 55 U/L (ref 39–117)
BUN: 11 mg/dL (ref 6–23)
Calcium: 8.4 mg/dL (ref 8.4–10.5)
GFR calc Af Amer: 66 mL/min — ABNORMAL LOW (ref >90)
Glucose, Bld: 143 mg/dL — ABNORMAL HIGH (ref 70–99)
Potassium: 3.5 mEq/L (ref 3.5–5.1)
Total Protein: 5.9 g/dL — ABNORMAL LOW (ref 6.0–8.3)

## 2012-10-06 LAB — CBC
HCT: 32.7 % — ABNORMAL LOW (ref 39.0–52.0)
MCH: 29.1 pg (ref 26.0–34.0)
MCHC: 34.6 g/dL (ref 30.0–36.0)
RDW: 14.6 % (ref 11.5–15.5)

## 2012-10-06 LAB — GLUCOSE, CAPILLARY: Glucose-Capillary: 265 mg/dL — ABNORMAL HIGH (ref 70–99)

## 2012-10-06 LAB — CERULOPLASMIN: Ceruloplasmin: 24 mg/dL (ref 20–60)

## 2012-10-06 LAB — PROTIME-INR: Prothrombin Time: 15.7 seconds — ABNORMAL HIGH (ref 11.6–15.2)

## 2012-10-06 MED ORDER — WARFARIN SODIUM 10 MG PO TABS
10.0000 mg | ORAL_TABLET | ORAL | Status: AC
  Administered 2012-10-06: 10 mg via ORAL
  Filled 2012-10-06: qty 1

## 2012-10-06 MED ORDER — WARFARIN - PHARMACIST DOSING INPATIENT: Freq: Every day | Status: DC

## 2012-10-06 MED ORDER — ENALAPRIL MALEATE 10 MG PO TABS
10.0000 mg | ORAL_TABLET | Freq: Every day | ORAL | Status: DC
  Administered 2012-10-07: 10 mg via ORAL
  Filled 2012-10-06: qty 1

## 2012-10-06 NOTE — Progress Notes (Signed)
ANTICOAGULATION CONSULT NOTE - Initial Consult  Pharmacy Consult for Warfarin Indication: atrial fibrillation  Allergies  Allergen Reactions  . Percocet (Oxycodone-Acetaminophen) Other (See Comments)    "almost a heart attack"    Patient Measurements: Height: 5\' 6"  (167.6 cm) Weight: 150 lb 9.2 oz (68.3 kg) IBW/kg (Calculated) : 63.8  Vital Signs: Temp: 98.2 F (36.8 C) (03/29 1400) Temp src: Oral (03/29 1400) BP: 156/68 mmHg (03/29 1400) Pulse Rate: 68 (03/29 1400)  Labs:  Recent Labs  10/04/12 0633 10/05/12 0650 10/06/12 0600  HGB 11.1* 11.2* 11.3*  HCT 33.9* 33.0* 32.7*  PLT 188 212 208  CREATININE 1.19 1.15 1.19    Estimated Creatinine Clearance: 46.2 ml/min (by C-G formula based on Cr of 1.19).   Medical History: Past Medical History  Diagnosis Date  . Permanent atrial fibrillation   . Hypertension   . HNP (herniated nucleus pulposus), lumbar   . PVD (peripheral vascular disease)   . Diabetes mellitus   . CAD (coronary artery disease)     s/p CABG x 4  . Ventricular hypertrophy     Medications:  Prescriptions prior to admission  Medication Sig Dispense Refill  . ALPRAZolam (XANAX) 0.5 MG tablet Take 1 tablet (0.5 mg total) by mouth 2 (two) times daily as needed for anxiety.  20 tablet  0  . aspirin EC 81 MG EC tablet Take 1 tablet (81 mg total) by mouth daily.  30 tablet  0  . atorvastatin (LIPITOR) 20 MG tablet Take 20 mg by mouth daily.      . digoxin (LANOXIN) 0.25 MG tablet Take 1 tablet (250 mcg total) by mouth daily.  30 tablet  0  . enalapril (VASOTEC) 5 MG tablet Take 1 tablet (5 mg total) by mouth daily.  30 tablet  0  . glyBURIDE (DIABETA) 5 MG tablet Take 1 tablet (5 mg total) by mouth daily with breakfast.  30 tablet  0  . HYDROcodone-acetaminophen (NORCO/VICODIN) 5-325 MG per tablet Take 1 tablet by mouth every 4 (four) hours as needed for pain.      . metFORMIN (GLUCOPHAGE) 500 MG tablet Take 1 tablet (500 mg total) by mouth 2 (two)  times daily with a meal.  60 tablet  0  . metoprolol succinate (TOPROL-XL) 50 MG 24 hr tablet Take 1 tablet (50 mg total) by mouth 2 (two) times daily. Take with or immediately following a meal.  60 tablet  0  . warfarin (COUMADIN) 7.5 MG tablet Take 7.5 mg by mouth every evening. At 6pm      . glucose blood (ACCU-CHEK ADVANTAGE TEST) test strip Use as instructed  100 each  12    Assessment: 79 y.o. male presented abd pain/N/V/bloody stools. No bloody stools since yesterday a.m. GI w/u complete - they suspect bloody stools from acute ischemic colitis which has resolved. CBC remains stable. Pt was on coumadin PTA (dose 7.5mg  daily) with baseline INR 1.88. Last dose on 3/23. Coumadin held since. INR today down to 1.28.   Goal of Therapy:  INR 2-3 Monitor platelets by anticoagulation protocol: Yes   Plan:  1) INR  daily 2) Coumadin 10mg  po tonight  Christoper Fabian, PharmD, BCPS Clinical pharmacist, pager [phone] 10/06/2012,4:20 PM

## 2012-10-06 NOTE — Progress Notes (Signed)
[facility] PROGRESS NOTE  [name] ZOX:[id number] DOB: July 19, 1931 DOA: 10/01/2012 PCP: Pcp Not In System  Assessment/Plan: Principal Problem:   Nausea, vomiting, and diarrhea Active Problems:   HYPERTENSION, BENIGN   PVD WITH CLAUDICATION   CAD (coronary artery disease)   Rectal bleeding   Atrial fibrillation with RVR   Diabetes mellitus type 2, uncontrolled   Noninfectious gastroenteritis and colitis    1. Acute colitis: Patient presented with nausea, vomiting, abdominal pain, diarrhea with blood in stools. C. difficile PCR negative. Abdominal/pelvic CT scan revealed findings, compatible with severe colitis. There was pneumoperitoneum and subcutaneous emphysema in the anterior right abdominal wall are presumably postoperative in this patient with recent history of laparoscopic cholecystectomy. Etiologies include infective, vs ischemic colitis. I favor the latter, against a known background of atrial fibrillation and PVD. Managed with bowel rest and supportive treatment. Patient was originally treated with Flagyl, but switched to iv Zosyn from 10/02/12-10/05/12. Dr Yancey Flemings provided GI consultation, and we are managing as recommended. Of note, surgical team paid a visit on 10/03/12, in view of pneumoperitoneum, seen on CT scan. Per surgeons, this is not unexpected, and is not concerning. Tolerated clears, and was accordingly, advanced to soft mechanical diet, without deleterious effects. No diarrhea since 10/04/12, patient had one formed stool today, abdominal pain is gone, and wcc is trending down nicely. Have discontinued antibiotics on 10/06/12.  2. Atrial fibrillation with RVR: Patient was in rapid atrial fibrillation at presentation. Heart rate is now controlled on controlled on Digoxin and Metoprolol. Anticoagulation held, secondary to GI bleed. Will continue to hold in anticipation of possible colonoscopy, unless GI okays restarting.  3. Uncontrolled type 2 diabetes mellitus:  Oral hypoglycemics are on hold. Managing with Levemir/SSI, adjusted as indicated. HBA1c was 9 on 09/25/12. CBGs are reasonable.  4. Hypertension: This was initially uncontrolled, due to pain, nausea and vomiting. Appears to have responded to beta-blocker and prn labetalol, but still sub-optimally controlled. Have added Norvasc on 10/04/12, with improved control. 5. Acute renal failure: Creatinine was 1.40 at presentation, BUN 34, against a known baseline creatinine of 1.22 on 09/28/12. This is AKI, likely secondary to dehydration from problem #1. Improved with iv fluids. Creatinine was 1.18 on 10/03/12. Following renal indices 6. ? Aspiration due to vomiting: This is suggested by abdominal CT  findings, which revealed extensive bibasilar interstitial and airspace disease, particularly in the lower lobes, concerning for sequela of recent aspiration. Adequately addressed with Zosyn. Patient currently has no cough, pyrexia or SOB. As described above, have discontinued antibiotics on 10/06/12. CXR of 10/05/12 showed some interval increase in bibasilar interstitial opacities. Placed on incentive spirometry.    Code Status: Full Code.  Family Communication:  Disposition Plan: To be determined.    Brief narrative: 79 year old male, [place]nian speaking, with history of A. fib on chronic anticoagulation, HTN, DM, CAD, CABG, PAD who was discharged on 09/29/12 following laparoscopic cholecystectomy on 09/27/12. Nuclear stress test at that admission was negative. He was admitted again on 10/01/12 with complaints of nausea, vomiting, abdominal pain, diarrhea mixed with blood which started a day prior to admission. In the ED, A. fib with RVR, WBC 23K, INR 1.8, hemoglobin 13. Patient admitted for further evaluation and management.   Consultants:  [facility].   Dr Violeta Gelinas, surgery.   Procedures:  CXR.  Abdominal?pelvic CT.   Antibiotics:  Flagyl 10/01/12-10/02/12.   Zosyn 10/02/12>>>  HPI/Subjective: No  new issues.    Objective: Vital signs in last 24 hours: Temp:  [  98.2 F (36.8 C)-98.9 F (37.2 C)] 98.2 F (36.8 C) (03/29 1000) Pulse Rate:  [57-75] 70 (03/29 1000) Resp:  [18-19] 18 (03/29 1000) BP: (138-183)/(48-75) 183/75 mmHg (03/29 1000) SpO2:  [94 %-99 %] 94 % (03/29 1000) Weight change:  Last BM Date: 10/05/12  Intake/Output from previous day: 03/28 0701 - 03/29 0700 In: 410 [P.O.:360; IV Piggyback:50] Out: 1703 [Urine:1701; Stool:2] Total I/O In: 240 [P.O.:240] Out: 350 [Urine:350]   Physical Exam: General: Comfortable, alert, communicative, not short of breath at rest.  HEENT:  Mild clinical pallor, no jaundice, no conjunctival injection or discharge. Hydration is satisfactory.  NECK:  Supple, JVP not seen, no carotid bruits, no palpable lymphadenopathy, no palpable goiter. CHEST:  Clinically clear to auscultation.  HEART:  Sounds 1 and 2 heard, normal, irregular, no murmurs. ABDOMEN:  Full, soft, healed laparoscopic wounds, non-tender, no palpable organomegaly, no palpable masses, normal bowel sounds. GENITALIA:  Not examined. LOWER EXTREMITIES:  No pitting edema, palpable peripheral pulses. MUSCULOSKELETAL SYSTEM:  Generalized osteoarthritic changes, otherwise, normal. CENTRAL NERVOUS SYSTEM:  No focal neurologic deficit on gross examination.  Lab Results:  Recent Labs  10/05/12 0650 10/06/12 0600  WBC 12.0* 11.3*  HGB 11.2* 11.3*  HCT 33.0* 32.7*  PLT 212 208    Recent Labs  10/05/12 0650 10/06/12 0600  NA 138 137  K 3.2* 3.5  CL 102 104  CO2 26 24  GLUCOSE 101* 143*  BUN 10 11  CREATININE 1.15 1.19  CALCIUM 8.3* 8.4   Recent Results (from the past 240 hour(s))  CLOSTRIDIUM DIFFICILE BY PCR     Status: None   Collection Time    10/02/12 11:05 AM      Result Value Range Status   C difficile by pcr NEGATIVE  NEGATIVE Final     Studies/Results: Dg Chest Port 1 View  10/04/2012  *RADIOLOGY REPORT*  Clinical Data: Wheezes  PORTABLE CHEST  - 1 VIEW  Comparison: 10/01/2012  Findings: Previous CABG.  The subdiaphragmatic gas seen on the prior study is no longer evident.  Mild cardiomegaly stable. Interval increase in mild bibasilar interstitial edema, infiltrates, or subsegmental atelectasis.  No definite effusion.  IMPRESSION:  1.  Stable cardiomegaly with some interval increase in bibasilar interstitial opacities.   Original Report Authenticated By: D. Andria Rhein, MD     Medications: Scheduled Meds: . amLODipine  10 mg Oral Daily  . atorvastatin  20 mg Oral q1800  . digoxin  250 mcg Oral Daily  . enalapril  5 mg Oral Daily  . feeding supplement  1 Container Oral TID BM  . insulin aspart  0-5 Units Subcutaneous QHS  . insulin aspart  0-9 Units Subcutaneous TID WC  . insulin detemir  5 Units Subcutaneous Daily  . metoprolol succinate  50 mg Oral BID  . potassium chloride  40 mEq Oral Daily  . sodium chloride  3 mL Intravenous Q12H   Continuous Infusions:   PRN Meds:.ALPRAZolam, docusate sodium, labetalol, morphine injection, ondansetron (ZOFRAN) IV, ondansetron    LOS: 5 days   [name],[name]  [facility] Pager [phone]. If 8PM-8AM, please contact night-coverage at [email or web address], password [email or web address] 10/06/2012, 12:42 PM  LOS: 5 days

## 2012-10-06 NOTE — Discharge Summary (Signed)
Physician Discharge Summary  [name] WUJ:[id number] DOB: [birth year] DOA: 10/01/2012  PCP: Pcp Not In System  Admit date: 10/01/2012 Discharge date: 10/07/2012  Time spent: 40 minutes  Recommendations for Outpatient Follow-up:  1. Follow up with PMD. 2. Follow up with Dr Lodema Pilot, surgeon.  Discharge Diagnoses:  Principal Problem:   Nausea, vomiting, and diarrhea Active Problems:   HYPERTENSION, BENIGN   PVD WITH CLAUDICATION   CAD (coronary artery disease)   Rectal bleeding   Atrial fibrillation with RVR   Diabetes mellitus type 2, uncontrolled   Noninfectious gastroenteritis and colitis   Ischemic colitis   Discharge Condition: Satisfactory.   Diet recommendation: Heart-Healthy/Carbohydrate-Modified.   Filed Weights   10/03/12 2013 10/04/12 2149 10/06/12 2058  Weight: 68.584 kg (151 lb 3.2 oz) 68.3 kg (150 lb 9.2 oz) 69 kg (152 lb 1.9 oz)    History of present illness:  79 year old male, [place]nian speaking, with history of A. fib on chronic anticoagulation, HTN, DM, CAD, CABG, PAD who was discharged on 09/29/12 following laparoscopic cholecystectomy on 09/27/12. Nuclear stress test at that admission was negative. He was admitted again on 10/01/12 with complaints of nausea, vomiting, abdominal pain, diarrhea mixed with blood which started a day prior to admission. In the ED, A. fib with RVR, WBC 23K, INR 1.8, hemoglobin 13. Patient admitted for further evaluation and management.   Hospital Course:  1. Acute colitis: Patient presented with nausea, vomiting, abdominal pain, diarrhea with blood in stools. C. Difficile PCR negative. Abdominal/pelvic CT scan revealed findings, compatible with severe colitis. There was pneumoperitoneum and subcutaneous emphysema in the anterior right abdominal wall, presumably postoperative in this patient with recent history of laparoscopic cholecystectomy. Etiologies include infective, vs ischemic colitis. The latter was favored, against a  known background of atrial fibrillation and PVD. Managed with bowel rest and supportive treatment. Patient was originally treated with Flagyl, but switched to iv Zosyn from 10/02/12-10/05/12. Dr Yancey Flemings provided GI consultation, and patient was managed as recommended. Of note, surgical team paid a visit on 10/03/12, in view of pneumoperitoneum, seen on CT scan. Per surgeons, this is not unexpected, and is not concerning. Patient subsequently tolerated clears, and was accordingly, advanced to soft mechanical diet, without deleterious effects. He has had no diarrhea since 10/04/12, abdominal pain is gone, wcc trended down nicely and stools are now formed. Per Dr Marina Goodell, no further GI follow up is indicated. 2. Atrial fibrillation with RVR: Patient was in rapid atrial fibrillation at presentation. Rate control was achieved with Digoxin and Metoprolol. Anticoagulation temporarily held, secondary to GI bleed, but was reinstated on 10/06/12.  3. Uncontrolled type 2 diabetes mellitus: Oral hypoglycemics were placed on hold on admission, patient was managed with Levemir/SSI, adjusted as indicated. HBA1c was 9 on 09/25/12. CBGs remained reasonable. Patient will be reinstated on pre-admission diabetic medication, on discharge. 4. Hypertension: This was initially uncontrolled, due to pain, nausea and vomiting, but responded to beta-blocker and prn labetalol. Norvasc was added to treatment on 10/04/12, and enalapril increased to 10 mg daily.  5. Acute renal failure: Creatinine was 1.40 at presentation, BUN 34, against a known baseline creatinine of 1.22 on 09/28/12. This is AKI, likely secondary to dehydration from problem #1. Improved with iv fluids. Creatinine was 1.18 on 10/03/12. AKI has resolved.  6. ? Aspiration due to vomiting: This is suggested by abdominal CT findings, which revealed extensive bibasilar interstitial and airspace disease, particularly in the lower lobes, concerning for sequela of recent aspiration.  Adequately addressed with  Zosyn. Patient had no cough, pyrexia or SOB. As described above, have discontinued antibiotics on 10/06/12. CXR of 10/05/12 showed some interval increase in bibasilar interstitial opacities, for which patient was managed with incentive spirometry. Clinically, pneumonia has resolved.    Procedures:  See Below.   Consultations: Dr Yancey Flemings, [facility].  Dr Violeta Gelinas, surgery.    Discharge Exam: Filed Vitals:   10/06/12 2058 10/06/12 2328 10/07/12 0523 10/07/12 0920  BP: 177/82 153/58 151/65 153/85  Pulse: 80 56 63 86  Temp: 98 F (36.7 C)  98.1 F (36.7 C) 98.2 F (36.8 C)  TempSrc: Oral  Oral   Resp: 17  17 17   Height:      Weight: 69 kg (152 lb 1.9 oz)     SpO2: 98%  95% 98%   General: Comfortable, alert, communicative, not short of breath at rest.  HEENT: Mild clinical pallor, no jaundice, no conjunctival injection or discharge. Hydration is satisfactory.  NECK: Supple, JVP not seen, no carotid bruits, no palpable lymphadenopathy, no palpable goiter.  CHEST: Clinically clear to auscultation.  HEART: Sounds 1 and 2 heard, normal, irregular, no murmurs.  ABDOMEN: Full, soft, healed laparoscopic wounds, non-tender, no palpable organomegaly, no palpable masses, normal bowel sounds.  GENITALIA: Not examined.  LOWER EXTREMITIES: No pitting edema, palpable peripheral pulses.  MUSCULOSKELETAL SYSTEM: Generalized osteoarthritic changes, otherwise, normal.  CENTRAL NERVOUS SYSTEM: No focal neurologic deficit on gross examination.  Discharge Instructions      Discharge Orders   Future Appointments Provider Department Dept Phone   10/16/2012 12:15 PM [facility], [place] [phone]   Future Orders Complete By Expires     Diet - low sodium heart healthy  As directed     Diet Carb Modified  As directed     Increase activity slowly  As directed         Medication List    TAKE these medications       ALPRAZolam  0.5 MG tablet  Commonly known as:  XANAX  Take 1 tablet (0.5 mg total) by mouth 2 (two) times daily as needed for anxiety.     amLODipine 10 MG tablet  Commonly known as:  NORVASC  Take 1 tablet (10 mg total) by mouth daily.     aspirin 81 MG EC tablet  Take 1 tablet (81 mg total) by mouth daily.     atorvastatin 20 MG tablet  Commonly known as:  LIPITOR  Take 20 mg by mouth daily.     digoxin 0.25 MG tablet  Commonly known as:  LANOXIN  Take 1 tablet (250 mcg total) by mouth daily.     enalapril 10 MG tablet  Commonly known as:  VASOTEC  Take 1 tablet (10 mg total) by mouth daily.     feeding supplement Pudg  Take 1 Container by mouth 3 (three) times daily between meals.     glucose blood test strip  Commonly known as:  ACCU-CHEK ADVANTAGE TEST  Use as instructed     glyBURIDE 5 MG tablet  Commonly known as:  DIABETA  Take 1 tablet (5 mg total) by mouth daily with breakfast.     HYDROcodone-acetaminophen 5-325 MG per tablet  Commonly known as:  NORCO/VICODIN  Take 1 tablet by mouth every 4 (four) hours as needed for pain.     metFORMIN 500 MG tablet  Commonly known as:  GLUCOPHAGE  Take 1 tablet (500 mg total) by mouth 2 (  two) times daily with a meal.     metoprolol succinate 50 MG 24 hr tablet  Commonly known as:  TOPROL-XL  Take 1 tablet (50 mg total) by mouth 2 (two) times daily. Take with or immediately following a meal.     warfarin 7.5 MG tablet  Commonly known as:  COUMADIN  Take 7.5 mg by mouth every evening. At 6pm       Follow-up Information   Please follow up. (Follow up with primary MD. )       Schedule an appointment as soon as possible for a visit with LAYTON, BRIAN DAVID, DO.   Contact information:   [address] [place] [address] [phone]        The results of significant diagnostics from this hospitalization (including imaging, microbiology, ancillary and laboratory) are listed below for reference.     Significant Diagnostic Studies: Dg Chest 2 View  10/01/2012  *RADIOLOGY REPORT*  Clinical Data: Rectal bleeding.  Abdominal pain.  Cholecystectomy on 09/27/2012  CHEST - 2 VIEW  Comparison: Chest x-ray dated 09/24/2012  Findings: There is free air under the diaphragm, probably secondary to the recent cholecystectomy.  There are no acute infiltrates or effusions.  Chronic accentuation of the interstitial markings particularly at the bases.  Prior CABG.  No acute osseous abnormality.  IMPRESSION: No acute disease in the chest.  Free air under the diaphragm consistent with recent abdominal surgery.   Original Report Authenticated By: Francene Boyers, M.D.    Dg Chest 2 View  09/24/2012  *RADIOLOGY REPORT*  Clinical Data: Chest pain.  Weakness. Shortness of breath.  CHEST - 2 VIEW  Comparison: 09/07/2010.  Findings:  Post CABG.  Heart size top normal.  Chronic lung changes with central pulmonary vascular prominence but without frank pulmonary edema, segmental infiltrate or pneumothorax.  Calcified mildly tortuous aorta.  IMPRESSION: No acute abnormality.  Please see above.   Original Report Authenticated By: Lacy Duverney, M.D.    Dg Cholangiogram Operative  09/27/2012  *RADIOLOGY REPORT*  Clinical Data:   Cholecystitis  INTRAOPERATIVE CHOLANGIOGRAM  Technique:  Cholangiographic images from the C-arm fluoroscopic device were submitted for interpretation post-operatively.  Please see the procedural report for the amount of contrast and the fluoroscopy time utilized.  Comparison:  None.  Findings:  Contrast fills the biliary tree and duodenum.  There is some reflux of contrast into the pancreatic duct.  There are no filling defects in the common bile duct to suggest common duct stones.  IMPRESSION: Patent biliary tree without evidence of common bile duct stones.   Original Report Authenticated By: Jolaine Click, M.D.    Ct Head Wo Contrast  09/24/2012  *RADIOLOGY REPORT*  Clinical Data: Dizziness.  Generalized  weakness.  Current history of hypertension.  CT HEAD WITHOUT CONTRAST  Technique:  Contiguous axial images were obtained from the base of the skull through the vertex without contrast.  Comparison: Unenhanced cranial CT 12/18/2006.  Findings: Moderate cortical and deep atrophy and mild to moderate cerebellar atrophy, unchanged.  Moderate to severe changes of small vessel disease of the white matter diffusely, unchanged.  Old lacunar strokes in the right basal ganglia, unchanged.  No mass lesion.  No midline shift.  No acute hemorrhage or hematoma.  No extra-axial fluid collections.  No evidence of acute infarction. No significant interval change.  No skull fracture or other focal osseous abnormality involving the skull.  Opacification of both maxillary sinuses, right greater than left, with air-fluid levels.  Opacification of scattered ethmoid air cells bilaterally.  Inspissated mucous in the right sphenoid sinus with a small fluid level.  Bilateral mastoid air cells and middle ear cavities well-aerated.  IMPRESSION:  1.  No acute intracranial abnormality. 2.  Stable moderate generalized atrophy, moderate to severe chronic microvascular ischemic changes of the white matter, and old lacunar strokes in the right basal ganglia. 3.  Acute bilateral maxillary sinusitis, right greater than left. Bilateral ethmoid sinusitis which may be acute or chronic.  Mild acute right sphenoid sinusitis.   Original Report Authenticated By: Hulan Saas, M.D.    US Abdomen Complete  09/24/2012  *RADIOLOGY REPORT*  Clinical Data:  Abdominal pain.  Elevated lipase and  BUN. Diabetic hypertensive patient.  COMPLETE ABDOMINAL ULTRASOUND  Comparison:  None.  Findings:  Gallbladder:  Echogenic shadowing in the gallbladder fossa suggestive of gallstones.  Gallbladder wall thickening of the 3.7 mm.  Per ultrasound technologist, the patient was not tender over this region during scanning.  Common bile duct:  6.5 mm proximally.  The mid and  distal aspect not visualized secondary to bowel gas.  Liver:  No focal lesion identified.  Within normal limits in parenchymal echogenicity.  IVC:  Appears normal.  Pancreas:  Not visualized secondary to bowel gas.  Spleen:  8.9 cm.  Accessory splenic tissue noted.  Right Kidney:  9.1 cm. No hydronephrosis or renal mass.Mild increased echogenicity may represent result of medical renal disease type changes.  Left Kidney:  10.7 cm.  No hydronephrosis or renal mass.Mild increased echogenicity may represent result of medical renal disease type changes.  Abdominal aorta:  Ectatic and calcified consistent with atherosclerotic type changes.  The full extent of the aorta is not imaged secondary to bowel gas.  Portions visualized with maximal AP dimension 2.8 cm.  IMPRESSION: Echogenic shadowing in the gallbladder fossa suggestive of gallstones.  Gallbladder wall thickening of the 3.7 mm.  Pancreas, portions of the abdominal aorta and mid to distal common bile duct not visualized secondary to bowel gas.  Increased echogenicity of renal parenchyma may represent result of medical renal disease type changes.   Original Report Authenticated By: Lacy Duverney, M.D.    Ct Abdomen Pelvis W Contrast  10/04/2012  **ADDENDUM** CREATED: 10/04/2012 12:27:42  As stated in the report, there is diffuse severe atherosclerosis. The SMA origin is patent.  The celiac trunk appears to remain patent.  The IMA is diminutive and calcified, but probably is patent proximally.  **END ADDENDUM** SIGNED BY: Harley Hallmark, M.D.   10/02/2012  *RADIOLOGY REPORT*  Clinical Data: Acute abdominal pain.  Recent history of laparoscopic cholecystectomy.  CT ABDOMEN AND PELVIS WITH CONTRAST  Technique:  Multidetector CT imaging of the abdomen and pelvis was performed following the standard protocol during bolus administration of intravenous contrast.  Contrast: OMNIPAQUE IOHEXOL 300 MG/ML  SOLN  Comparison: No priors.  Findings:  Lung Bases: Extensive  bilateral lower lobe septal thickening, peribronchovascular interstitial prominence and airspace disease. Heart size is mildly enlarged. There is no significant pericardial fluid, thickening or pericardial calcification.  Atherosclerotic calcifications are noted in the left anterior descending, left circumflex and right coronary arteries.  Status post median sternotomy for CABG.  Abdomen/Pelvis:  Postoperative changes of recent cholecystectomy are noted with a small amount of soft tissue stranding in the gallbladder fossa.  No abnormal fluid collection in the gallbladder fossa is noted at this time to suggest the presence of a biloma or a developing abscess.  There is a  focal area of decreased attenuation in segment 4A of the liver which may represent a perfusion anomaly or focal fat.  The liver has a slightly shrunken appearance and nodular contour, compatible with early changes of cirrhosis.  The appearance of the pancreas, spleen and bilateral adrenal glands is unremarkable.  There are several subcentimeter low attenuation lesions in the kidneys bilaterally which are too small to definitively characterize.  Additionally, there are patchy areas of parenchymal thinning in the kidneys bilaterally, particularly in the posterior aspect of the right kidney, compatible with scarring related to prior infarctions or infection.  There is a moderate volume of pneumoperitoneum, predominately in and around the gallbladder fossa as well as anterior to the liver. There is also some gas tracking in the anterior abdominal wall on the right side.  There is a trace volume of ascites, as well as stranding around portions of the colon, particularly in the left pericolic gutter.  The entire colon appears thickened and hypervascular, strongly suggestive of a colitis.  No pathologic distension of small bowel.  There are reactive sized mesenteric and retroperitoneal lymph nodes noted.  Severe atherosclerosis of the abdominal and pelvic  vasculature, including what appears to be a high-grade stenosis or complete occlusion of the superficial femoral arteries bilaterally.  Prostate is heavily calcified but otherwise unremarkable in appearance.  Urinary bladder is normal in appearance.  Musculoskeletal: There are no aggressive appearing lytic or blastic lesions noted in the visualized portions of the skeleton.  IMPRESSION: 1.  Findings, as above, compatible with severe colitis. 2.  Pneumoperitoneum and subcutaneous emphysema in the anterior right abdominal wall are presumably postoperative in this patient with recent history of laparoscopic cholecystectomy. 3.  Extensive bibasilar interstitial and airspace disease, particularly in the lower lobes, concerning for sequela of recent aspiration.  Clinical correlation is recommended. 4.  Severe atherosclerosis, including multivessel coronary artery disease in this patient status post CABG.  Notably, there appears to be either high-grade stenosis or complete occlusion of the superficial femoral arteries bilaterally. 4.  The appearance of the liver suggests early changes of cirrhosis, as above. 5.  Additional incidental findings, as above.   Original Report Authenticated By: Trudie Reed, M.D.    Nm Myocar Multi W/spect W/wall Motion / Ef  09/26/2012  *RADIOLOGY REPORT*  Clinical Data:  The patient has known coronary disease.  There has been some chest pain.  The study is done for further evaluation.  MYOCARDIAL IMAGING WITH SPECT (REST AND PHARMACOLOGIC-STRESS) GATED LEFT VENTRICULAR WALL MOTION STUDY LEFT VENTRICULAR EJECTION FRACTION  Technique:  Standard myocardial SPECT imaging was performed after resting intravenous injection of  mCi Tc-41m . Subsequently, intravenous infusion of  was performed under the supervision of the Cardiology staff.  At peak effect of the drug,  mCi Tc-78m  was injected intravenously and standard myocardial SPECT  imaging was performed.  Quantitative gated imaging was also  performed to evaluate left ventricular wall motion, and estimate left ventricular ejection fraction.  FINDINGS:  The patient was stressed with Lexiscan. He tolerated the infusion without major complaints.  The raw images revealed no evidence of excess motion.  Tomographic images reveal a question of decreased counts in the inferolateral wall.  I believe this is related to the shape of his ventricle.  It is possible there could be some scar in this area.  The stress image reveals moderate decreased activity at the base of the inferolateral wall.  The rest image reveals moderate decreased activity at the base of the  inferolateral wall.  There is no reversibility.  Wall motion is completely normal.  The ejection fraction is 55%.  IMPRESSION: This study shows no definite ischemia.  There is normal wall motion.  There is question of  scar at the base of the inferolateral wall.  It is possible that this finding is related to the shape of the ventricle.  Overall this is a low risk scan.   Original Report Authenticated By: Willa Rough, M.D.    Dg Chest Port 1 View  10/04/2012  *RADIOLOGY REPORT*  Clinical Data: Wheezes  PORTABLE CHEST - 1 VIEW  Comparison: 10/01/2012  Findings: Previous CABG.  The subdiaphragmatic gas seen on the prior study is no longer evident.  Mild cardiomegaly stable. Interval increase in mild bibasilar interstitial edema, infiltrates, or subsegmental atelectasis.  No definite effusion.  IMPRESSION:  1.  Stable cardiomegaly with some interval increase in bibasilar interstitial opacities.   Original Report Authenticated By: D. Andria Rhein, MD     Microbiology: Recent Results (from the past 240 hour(s))  CLOSTRIDIUM DIFFICILE BY PCR     Status: None   Collection Time    10/02/12 11:05 AM      Result Value Range Status   C difficile by pcr NEGATIVE  NEGATIVE Final     Labs: Basic Metabolic Panel:  Recent Labs Lab 10/03/12 0550 10/04/12 0633 10/05/12 0650 10/06/12 0600  10/07/12 0650  NA 135 136 138 137 136  K 3.7 3.7 3.2* 3.5 3.8  CL 103 101 102 104 101  CO2 24 23 26 24 26   GLUCOSE 117* 102* 101* 143* 154*  BUN 21 13 10 11 10   CREATININE 1.18 1.19 1.15 1.19 1.04  CALCIUM 8.4 8.0* 8.3* 8.4 8.7   Liver Function Tests:  Recent Labs Lab 10/01/12 2003 10/04/12 0633 10/06/12 0600  AST 23 39* 34  ALT 35 38 39  ALKPHOS 53 64 55  BILITOT 0.6 0.7 0.5  PROT 7.2 5.6* 5.9*  ALBUMIN 3.1* 2.3* 2.5*    Recent Labs Lab 10/02/12 1355  LIPASE 25   No results found for this basename: AMMONIA,  in the last 168 hours CBC:  Recent Labs Lab 10/01/12 2003 10/01/12 2134 10/02/12 0540  10/03/12 0550 10/04/12 0633 10/05/12 0650 10/06/12 0600 10/07/12 0650  WBC 23.2* 24.8* 28.3*  < > 21.8* 17.9* 12.0* 11.3* 12.0*  NEUTROABS 20.9* 22.2* 24.8*  --   --   --   --   --   --   HGB 13.3 13.4 13.1  < > 11.3* 11.1* 11.2* 11.3* 12.0*  HCT 37.7* 38.5* 38.2*  < > 34.2* 33.9* 33.0* 32.7* 35.6*  MCV 85.9 86.1 85.5  < > 87.9 87.8 84.6 84.3 86.6  PLT 210 222 222  < > 198 188 212 208 249  < > = values in this interval not displayed. Cardiac Enzymes: No results found for this basename: CKTOTAL, CKMB, CKMBINDEX, TROPONINI,  in the last 168 hours BNP: BNP (last 3 results) No results found for this basename: PROBNP,  in the last 8760 hours CBG:  Recent Labs Lab 10/06/12 0759 10/06/12 1236 10/06/12 1639 10/06/12 2045 10/07/12 0740  GLUCAP 144* 265* 144* 188* 159*       Signed:  Merrick Maggio,CHRISTOPHER  [facility] 10/07/2012, 9:43 AM

## 2012-10-07 DIAGNOSIS — E119 Type 2 diabetes mellitus without complications: Secondary | ICD-10-CM

## 2012-10-07 DIAGNOSIS — K559 Vascular disorder of intestine, unspecified: Secondary | ICD-10-CM

## 2012-10-07 LAB — BASIC METABOLIC PANEL
Calcium: 8.7 mg/dL (ref 8.4–10.5)
Creatinine, Ser: 1.04 mg/dL (ref 0.50–1.35)
GFR calc Af Amer: 77 mL/min — ABNORMAL LOW (ref >90)

## 2012-10-07 LAB — CBC
MCH: 29.2 pg (ref 26.0–34.0)
MCV: 86.6 fL (ref 78.0–100.0)
Platelets: 249 10*3/uL (ref 150–400)
RDW: 14.6 % (ref 11.5–15.5)

## 2012-10-07 LAB — PROTIME-INR: Prothrombin Time: 15.6 seconds — ABNORMAL HIGH (ref 11.6–15.2)

## 2012-10-07 MED ORDER — ENSURE PUDDING PO PUDG: 1.0000 | Freq: Three times a day (TID) | ORAL | Status: DC

## 2012-10-07 MED ORDER — WARFARIN SODIUM 10 MG PO TABS
10.0000 mg | ORAL_TABLET | Freq: Once | ORAL | Status: AC
  Administered 2012-10-07: 10 mg via ORAL
  Filled 2012-10-07: qty 1

## 2012-10-07 MED ORDER — AMLODIPINE BESYLATE 10 MG PO TABS: 10.0000 mg | ORAL_TABLET | Freq: Every day | ORAL | Status: DC

## 2012-10-07 MED ORDER — ENALAPRIL MALEATE 10 MG PO TABS: 10.0000 mg | ORAL_TABLET | Freq: Every day | ORAL | Status: DC

## 2012-10-07 NOTE — Progress Notes (Addendum)
Daughter said she had just spoken on phone with pt and he said that earlier today he had felt pain in his chest. Spoke with pt through interpreter. Pt stated via interpreter that he had eaten a lot for breakfast and that after breakfast he had felt pressure in his chest. He said that the pressure went away and that he feels no pain now. He said he did not eat any lunch. Asked pt how he feels now and he stated via interpreter that he felt fine, no pressure or pain. Asked if pt was hungry and did he want something to eat; pt said no. Also, informed pt that he would be going home today; pt verbalized understanding. Informed daughter that I had spoke with pt via the interpreter and of what pt had stated. Informed Dr. Brien Few of pt's earlier chest pressure and that pt asymptomatic now. [place], Rosanna Randy

## 2012-10-07 NOTE — Progress Notes (Signed)
Pt's daughter, [name], called with concerns about her father bleeding again at home when Coumadin is restarted. She stated she was concerned because pt has been off Coumadin ever since his admission and she would feel better if first dose of Coumadin could be given while in the hospital and pt could be monitored for another 24 hrs to make sure his bleeding did not return. Daughter wanting to speak with MD about situation. Acknowledged daughter's concern. Paged Dr. Brien Few to give daughter a call. [place], Rosanna Randy

## 2012-10-07 NOTE — Progress Notes (Signed)
  ANTICOAGULATION CONSULT NOTE - Follow Up Consult  Pharmacy Consult for warfarin Indication: atrial fibrillation  Allergies  Allergen Reactions  . Percocet (Oxycodone-Acetaminophen) Other (See Comments)    "almost a heart attack"    Patient Measurements: Height: 5\' 6"  (167.6 cm) Weight: 152 lb 1.9 oz (69 kg) IBW/kg (Calculated) : 63.8  Vital Signs: Temp: 98.1 F (36.7 C) (03/30 0523) Temp src: Oral (03/30 0523) BP: 151/65 mmHg (03/30 0523) Pulse Rate: 63 (03/30 0523)  Labs:  Recent Labs  10/05/12 0650 10/06/12 0600 10/06/12 1642 10/07/12 0650  HGB 11.2* 11.3*  --  12.0*  HCT 33.0* 32.7*  --  35.6*  PLT 212 208  --  249  LABPROT  --   --  15.7* 15.6*  INR  --   --  1.28 1.27  CREATININE 1.15 1.19  --   --     Estimated Creatinine Clearance: 46.2 ml/min (by C-G formula based on Cr of 1.19).   Medications:  Scheduled:  . amLODipine  10 mg Oral Daily  . atorvastatin  20 mg Oral q1800  . digoxin  250 mcg Oral Daily  . enalapril  10 mg Oral Daily  . feeding supplement  1 Container Oral TID BM  . insulin aspart  0-5 Units Subcutaneous QHS  . insulin aspart  0-9 Units Subcutaneous TID WC  . insulin detemir  5 Units Subcutaneous Daily  . metoprolol succinate  50 mg Oral BID  . potassium chloride  40 mEq Oral Daily  . sodium chloride  3 mL Intravenous Q12H  . [COMPLETED] warfarin  10 mg Oral NOW  . Warfarin - Pharmacist Dosing Inpatient   Does not apply q1800  . [DISCONTINUED] enalapril  5 mg Oral Daily    Assessment: 79 y/o male presented with abd pain/N/V/bloody stools. No bloody stools since 3/28 a.m. GI w/u complete - they suspect bloody stools from acute ischemic colitis which has resolved. CBC remains stable. Pt was on coumadin PTA (dose 7.5mg  daily) with baseline INR 1.88. Last dose on 3/23. Coumadin held since. INR 1.27 today after a dose of 10mg  last night.  Goal of Therapy:  INR 2-3 Monitor platelets by anticoagulation protocol: Yes   Plan:  -  Warfarin10mg  x 1 tonight - Follow up daily INR, s/sx bleeding, CBC  Lillia Pauls, PharmD Clinical Pharmacist Pager: [phone] Phone: [phone] 10/07/2012 8:56 AM

## 2012-10-07 NOTE — Progress Notes (Signed)
AVS reviewed with pt through the interpreter. Pt verbalized understanding of AVS and questions were answered.  Also, reviewed AVS and answered questions with daughter at bedside. Daughter will be caring for pt at home. IV and tele removed. Pt remains stable. Pt to transport via wheel chair to exit of facility. [place], Rosanna Randy

## 2012-10-09 ENCOUNTER — Other Ambulatory Visit (HOSPITAL_COMMUNITY): Payer: Self-pay | Admitting: Cardiovascular Disease

## 2012-10-09 DIAGNOSIS — I70219 Atherosclerosis of native arteries of extremities with intermittent claudication, unspecified extremity: Secondary | ICD-10-CM

## 2012-10-09 DIAGNOSIS — I150 Renovascular hypertension: Secondary | ICD-10-CM

## 2012-10-09 DIAGNOSIS — K559 Vascular disorder of intestine, unspecified: Secondary | ICD-10-CM

## 2012-10-14 ENCOUNTER — Encounter: Payer: Self-pay | Admitting: *Deleted

## 2012-10-15 ENCOUNTER — Encounter: Payer: Self-pay | Admitting: Cardiovascular Disease

## 2012-10-16 ENCOUNTER — Ambulatory Visit (INDEPENDENT_AMBULATORY_CARE_PROVIDER_SITE_OTHER): Payer: Medicaid Other | Admitting: General Surgery

## 2012-10-16 ENCOUNTER — Encounter (INDEPENDENT_AMBULATORY_CARE_PROVIDER_SITE_OTHER): Payer: Self-pay | Admitting: General Surgery

## 2012-10-16 VITALS — BP 110/64 | HR 58 | Temp 97.0°F | Resp 18 | Ht 66.0 in | Wt 149.0 lb

## 2012-10-16 DIAGNOSIS — Z9889 Other specified postprocedural states: Secondary | ICD-10-CM

## 2012-10-16 DIAGNOSIS — Z9049 Acquired absence of other specified parts of digestive tract: Secondary | ICD-10-CM

## 2012-10-16 NOTE — Patient Instructions (Signed)
slobodno mozete da radite normalne djelatnost. Nemozete da dizete 20 kila ili vise za 3 sedmice. Nazovite nas ako imate pitanja ili zabrinutost.

## 2012-10-16 NOTE — Progress Notes (Signed)
  [name] [birth year] [id number] 10/16/2012   [name] is a 79 y.o. [place] male who had a laparoscopic cholecystectomy with intraoperative cholangiogram by Dr. Biagio Quint dated 09/27/2012.  The pathology report confirmed eroded gallbladder mucosa, no tumor .  He was subsequently discharged on 09/29/12, readmitted 2 days later with bloody diarrhea and abdominal pain, attributed to infectious colitis.  GI followed throughout admission.  He was treated with zosyn for aspiration pneumonia.  His symptoms resolved.  He presents today for a post op follow up.  He has not had any further episodes of diarrhea.  Denies nausea or vomiting.  His appetite is good.  Bowel movements are normal.  He has followed up with his cardiologist who monitors his INR.  He is due to see his pcp in 2 weeks.  He is accompanied by his daughter [name].  I was able to communicate with pt. And daughter without difficulties in his native language.    Physical examination - Incisions appear well-healed with no sign of infection or bleeding.   Abdomen - soft, non-tender  Impression:  s/p laparoscopic cholecystectomy(Dr. Biagio Quint 09/27/12)  Plan:  He may resume a regular diet and full activity. I encouraged him to increase physical activity.  He may follow-up on a PRN basis.

## 2012-10-30 ENCOUNTER — Ambulatory Visit (HOSPITAL_COMMUNITY)
Admission: RE | Admit: 2012-10-30 | Discharge: 2012-10-30 | Disposition: A | Discharge status: Home / Self Care | Payer: Medicaid Other | Source: Ambulatory Visit | Attending: Cardiovascular Disease | Admitting: Cardiovascular Disease

## 2012-10-30 DIAGNOSIS — I150 Renovascular hypertension: Secondary | ICD-10-CM

## 2012-10-30 DIAGNOSIS — K559 Vascular disorder of intestine, unspecified: Secondary | ICD-10-CM

## 2012-10-30 DIAGNOSIS — I70219 Atherosclerosis of native arteries of extremities with intermittent claudication, unspecified extremity: Secondary | ICD-10-CM | POA: Insufficient documentation

## 2012-10-30 NOTE — Progress Notes (Signed)
Lower extremity arterial duplex doppler exam was completed. Larene Pickett RVT

## 2012-10-30 NOTE — Progress Notes (Signed)
Renal artery duplex doppler exam was completed. Sarye Kath RVT 

## 2012-11-26 ENCOUNTER — Encounter (HOSPITAL_COMMUNITY): Payer: Self-pay | Admitting: *Deleted

## 2012-11-26 ENCOUNTER — Inpatient Hospital Stay (HOSPITAL_COMMUNITY)
Admission: EM | Admit: 2012-11-26 | Discharge: 2012-11-30 | DRG: 310 | Disposition: A | Discharge status: Home / Self Care | Payer: Medicaid Other | Attending: Internal Medicine | Admitting: Internal Medicine

## 2012-11-26 ENCOUNTER — Emergency Department (HOSPITAL_COMMUNITY)
Admission: EM | Admit: 2012-11-26 | Discharge: 2012-11-26 | Disposition: A | Discharge status: Nursing Home (Includes ICF) | Payer: Medicaid Other | Source: Home / Self Care | Attending: Emergency Medicine | Admitting: Emergency Medicine

## 2012-11-26 ENCOUNTER — Encounter (HOSPITAL_COMMUNITY): Payer: Self-pay | Admitting: Emergency Medicine

## 2012-11-26 ENCOUNTER — Other Ambulatory Visit: Payer: Self-pay | Admitting: Pharmacist

## 2012-11-26 ENCOUNTER — Emergency Department (HOSPITAL_COMMUNITY): Payer: Medicaid Other

## 2012-11-26 DIAGNOSIS — E1165 Type 2 diabetes mellitus with hyperglycemia: Secondary | ICD-10-CM | POA: Diagnosis present

## 2012-11-26 DIAGNOSIS — R001 Bradycardia, unspecified: Secondary | ICD-10-CM

## 2012-11-26 DIAGNOSIS — I4510 Unspecified right bundle-branch block: Secondary | ICD-10-CM

## 2012-11-26 DIAGNOSIS — I798 Other disorders of arteries, arterioles and capillaries in diseases classified elsewhere: Secondary | ICD-10-CM | POA: Diagnosis present

## 2012-11-26 DIAGNOSIS — F329 Major depressive disorder, single episode, unspecified: Secondary | ICD-10-CM

## 2012-11-26 DIAGNOSIS — I4891 Unspecified atrial fibrillation: Secondary | ICD-10-CM

## 2012-11-26 DIAGNOSIS — E1159 Type 2 diabetes mellitus with other circulatory complications: Secondary | ICD-10-CM | POA: Diagnosis present

## 2012-11-26 DIAGNOSIS — E119 Type 2 diabetes mellitus without complications: Secondary | ICD-10-CM

## 2012-11-26 DIAGNOSIS — I452 Bifascicular block: Secondary | ICD-10-CM | POA: Diagnosis present

## 2012-11-26 DIAGNOSIS — Z7982 Long term (current) use of aspirin: Secondary | ICD-10-CM

## 2012-11-26 DIAGNOSIS — M549 Dorsalgia, unspecified: Secondary | ICD-10-CM

## 2012-11-26 DIAGNOSIS — Z9119 Patient's noncompliance with other medical treatment and regimen: Secondary | ICD-10-CM

## 2012-11-26 DIAGNOSIS — R531 Weakness: Secondary | ICD-10-CM

## 2012-11-26 DIAGNOSIS — R42 Dizziness and giddiness: Secondary | ICD-10-CM

## 2012-11-26 DIAGNOSIS — Z7901 Long term (current) use of anticoagulants: Secondary | ICD-10-CM

## 2012-11-26 DIAGNOSIS — F411 Generalized anxiety disorder: Secondary | ICD-10-CM | POA: Diagnosis present

## 2012-11-26 DIAGNOSIS — I447 Left bundle-branch block, unspecified: Secondary | ICD-10-CM

## 2012-11-26 DIAGNOSIS — Z794 Long term (current) use of insulin: Secondary | ICD-10-CM

## 2012-11-26 DIAGNOSIS — I10 Essential (primary) hypertension: Secondary | ICD-10-CM | POA: Diagnosis present

## 2012-11-26 DIAGNOSIS — J8410 Pulmonary fibrosis, unspecified: Secondary | ICD-10-CM | POA: Diagnosis present

## 2012-11-26 DIAGNOSIS — IMO0002 Reserved for concepts with insufficient information to code with codable children: Secondary | ICD-10-CM

## 2012-11-26 DIAGNOSIS — I2510 Atherosclerotic heart disease of native coronary artery without angina pectoris: Secondary | ICD-10-CM

## 2012-11-26 DIAGNOSIS — R2681 Unsteadiness on feet: Secondary | ICD-10-CM

## 2012-11-26 DIAGNOSIS — K559 Vascular disorder of intestine, unspecified: Secondary | ICD-10-CM | POA: Diagnosis present

## 2012-11-26 DIAGNOSIS — E785 Hyperlipidemia, unspecified: Secondary | ICD-10-CM | POA: Diagnosis present

## 2012-11-26 DIAGNOSIS — N179 Acute kidney failure, unspecified: Secondary | ICD-10-CM

## 2012-11-26 DIAGNOSIS — N289 Disorder of kidney and ureter, unspecified: Secondary | ICD-10-CM | POA: Diagnosis present

## 2012-11-26 DIAGNOSIS — Z91199 Patient's noncompliance with other medical treatment and regimen due to unspecified reason: Secondary | ICD-10-CM

## 2012-11-26 DIAGNOSIS — Z87891 Personal history of nicotine dependence: Secondary | ICD-10-CM

## 2012-11-26 DIAGNOSIS — D179 Benign lipomatous neoplasm, unspecified: Secondary | ICD-10-CM

## 2012-11-26 DIAGNOSIS — M542 Cervicalgia: Secondary | ICD-10-CM

## 2012-11-26 DIAGNOSIS — J849 Interstitial pulmonary disease, unspecified: Secondary | ICD-10-CM | POA: Diagnosis present

## 2012-11-26 DIAGNOSIS — G8929 Other chronic pain: Secondary | ICD-10-CM | POA: Diagnosis present

## 2012-11-26 DIAGNOSIS — I498 Other specified cardiac arrhythmias: Secondary | ICD-10-CM | POA: Diagnosis present

## 2012-11-26 DIAGNOSIS — R269 Unspecified abnormalities of gait and mobility: Secondary | ICD-10-CM | POA: Diagnosis present

## 2012-11-26 DIAGNOSIS — M5126 Other intervertebral disc displacement, lumbar region: Secondary | ICD-10-CM | POA: Diagnosis present

## 2012-11-26 DIAGNOSIS — I4821 Permanent atrial fibrillation: Secondary | ICD-10-CM | POA: Diagnosis present

## 2012-11-26 DIAGNOSIS — F32A Depression, unspecified: Secondary | ICD-10-CM | POA: Diagnosis present

## 2012-11-26 DIAGNOSIS — I150 Renovascular hypertension: Secondary | ICD-10-CM | POA: Diagnosis present

## 2012-11-26 DIAGNOSIS — F3289 Other specified depressive episodes: Secondary | ICD-10-CM | POA: Diagnosis present

## 2012-11-26 DIAGNOSIS — I7389 Other specified peripheral vascular diseases: Secondary | ICD-10-CM | POA: Diagnosis present

## 2012-11-26 HISTORY — DX: Major depressive disorder, single episode, unspecified: F32.9

## 2012-11-26 HISTORY — DX: Depression, unspecified: F32.A

## 2012-11-26 LAB — POCT I-STAT 3, ART BLOOD GAS (G3+)
O2 Saturation: 100 %
pCO2 arterial: 45.6 mmHg — ABNORMAL HIGH (ref 35.0–45.0)
pO2, Arterial: 350 mmHg — ABNORMAL HIGH (ref 80.0–100.0)

## 2012-11-26 LAB — COMPREHENSIVE METABOLIC PANEL
ALT: 24 U/L (ref 0–53)
Alkaline Phosphatase: 52 U/L (ref 39–117)
CO2: 25 mEq/L (ref 19–32)
Chloride: 94 mEq/L — ABNORMAL LOW (ref 96–112)
GFR calc Af Amer: 49 mL/min — ABNORMAL LOW (ref >90)
GFR calc non Af Amer: 42 mL/min — ABNORMAL LOW (ref >90)
Glucose, Bld: 186 mg/dL — ABNORMAL HIGH (ref 70–99)
Potassium: 4.6 mEq/L (ref 3.5–5.1)
Sodium: 131 mEq/L — ABNORMAL LOW (ref 135–145)
Total Bilirubin: 1.4 mg/dL — ABNORMAL HIGH (ref 0.3–1.2)
Total Protein: 8 g/dL (ref 6.0–8.3)

## 2012-11-26 LAB — CBC
Hemoglobin: 12.8 g/dL — ABNORMAL LOW (ref 13.0–17.0)
RBC: 4.25 MIL/uL (ref 4.22–5.81)
WBC: 12.3 10*3/uL — ABNORMAL HIGH (ref 4.0–10.5)

## 2012-11-26 LAB — POCT I-STAT, CHEM 8
HCT: 39 % (ref 39.0–52.0)
Hemoglobin: 13.3 g/dL (ref 13.0–17.0)
Potassium: 4.8 mEq/L (ref 3.5–5.1)
Sodium: 138 mEq/L (ref 135–145)

## 2012-11-26 LAB — POCT I-STAT TROPONIN I: Troponin i, poc: 0 ng/mL (ref 0.00–0.08)

## 2012-11-26 MED ORDER — METOPROLOL SUCCINATE ER 50 MG PO TB24: 50.0000 mg | ORAL_TABLET | Freq: Two times a day (BID) | ORAL | Status: DC

## 2012-11-26 MED ORDER — DIGOXIN 250 MCG PO TABS: 250.0000 ug | ORAL_TABLET | Freq: Every day | ORAL | Status: DC

## 2012-11-26 MED ORDER — SODIUM CHLORIDE 0.9 % IV SOLN
1000.0000 mL | INTRAVENOUS | Status: DC
  Administered 2012-11-26: 1000 mL via INTRAVENOUS

## 2012-11-26 MED ORDER — ENALAPRIL MALEATE 10 MG PO TABS: 10.0000 mg | ORAL_TABLET | Freq: Every day | ORAL | Status: DC

## 2012-11-26 MED ORDER — ATORVASTATIN CALCIUM 20 MG PO TABS: 20.0000 mg | ORAL_TABLET | Freq: Every day | ORAL | Status: DC

## 2012-11-26 MED ORDER — AMLODIPINE BESYLATE 10 MG PO TABS: 10.0000 mg | ORAL_TABLET | Freq: Every day | ORAL | Status: DC

## 2012-11-26 NOTE — ED Provider Notes (Signed)
Chief Complaint:   Chief Complaint  Patient presents with  . Back Pain    History of Present Illness:   [name] is a 79 year old [place] gentleman who comes in today, he by his daughter who serves as a family interpreter. He is here today for multiple issues including back pain, neck pain, dizziness, and depression. He's had lower back pain for years. There's been no specific injury to his back. He's had injections in the past and these have helped, but his back seems to be getting worse over the past month. He has a difficult time walking and he's unsteady on his feet. His legs seem to be weak although he denies any numbness, tingling, or bladder or bowel dysfunction. Some of the difficulty walking may be due to his dizziness. He also has had a history of neck pain going on for years. Right now he has a lump on the right side of his neck which if he presses on it causes him to feel dizzy. His daughter states he's been upset about this lump. For the past month he's had dizziness. This is worse if he stands up or moves. He describes a whirling vertigo. He denies any syncope. He's been more short of breath than usual but denies chest pain, tightness, or pressure. He's felt very anxious and he denies any numbness or tingling in the upper lower extremities. For his neck and his back pain he's been on Vicodin which was given to him when he was hospitalized here and also by his primary care provider, Dayton Scrape, whom he saw a week ago. He's also very depressed and has not been eating well, has lost weight, not sleeping well, crying, listless, weak, and fatigue. When he was hospitalized a month ago he was sent home on what his daughter thinks it is an antidepressant, but it turns out that he's been on Xanax. She states this is not helping at all. He has not been on an actual antidepressant. He saw an orthopedist about 10 days ago was x-rayed his neck and his back and diagnosed arthritis. This was Dr.  Estill Bamberg. He recommended pain medication. The patient checks his blood pressure and pulse regularly at home and his pulse ranges from 30-60. He was hospitalized at [facility] about 2 months ago with similar symptoms although he was having chest pain at that time. Cardiac ischemia was ruled out but he was found to have gallstones, so he underwent a laparoscopic cholecystectomy. Following that he developed C. difficile colitis and had a pneumoperitoneum. He was rehospitalized for IV antibiotics.  Review of Systems:  Other than noted above, the patient denies any of the following symptoms. Systemic:  No fever, chills, sweats, fatigue, myalgias, headache, or anorexia. Eye:  No redness, pain or drainage. ENT:  No earache, nasal congestion, rhinorrhea, sinus pressure, or sore throat. Lungs:  No cough, sputum production, wheezing, shortness of breath.  Cardiovascular:  No chest pain, palpitations, or syncope. GI:  No nausea, vomiting, abdominal pain or diarrhea. GU:  No dysuria, frequency, or hematuria. Skin:  No rash or pruritis.  PMFSH:  Past medical history, family history, social history, meds, and allergies were reviewed.  He has an extensive past medical history, see old records for this. He is taking amlodipine, aspirin, Lipitor, Lanoxin, Vasotec, Toprol, and warfarin.  Physical Exam:   Vital signs:  BP 122/94  Pulse 77  Temp(Src) 98.2 F (36.8 C) (Oral)  Resp 22  SpO2 99% General:  Alert, in  no distress. He appears mildly depressed. Eye:  PERRL, full EOMs.  Lids and conjunctivas were normal. ENT:  TMs and canals were normal, without erythema or inflammation.  Nasal mucosa was clear and uncongested, without drainage.  Mucous membranes were moist.  Pharynx was clear, without exudate or drainage.  There were no oral ulcerations or lesions. Neck:  Supple, no adenopathy, tenderness or mass. Thyroid was normal. Neck has a limited range of motion with pain on movement. Lungs:  No  respiratory distress.  Lungs were clear to auscultation, without wheezes, rales or rhonchi.  Breath sounds were clear and equal bilaterally. Heart:  Irregularly irregular rhythm, without gallops, murmers or rubs. Abdomen:  Soft, flat, and non-tender to palpation.  No hepatosplenomagaly or mass. Skin:  Clear, warm, and dry, without rash or lesions. There is a 1.5 cm, soft, lipoma on his right posterior trapezius ridge.  Labs:   Results for orders placed during the hospital encounter of 11/26/12  POCT I-STAT, CHEM 8      Result Value Range   Sodium 138  135 - 145 mEq/L   Potassium 4.8  3.5 - 5.1 mEq/L   Chloride 104  96 - 112 mEq/L   BUN 29 (*) 6 - 23 mg/dL   Creatinine, Ser 8.11 (*) 0.50 - 1.35 mg/dL   Glucose, Bld 914 (*) 70 - 99 mg/dL   Calcium, Ion 7.82  9.56 - 1.30 mmol/L   TCO2 26  0 - 100 mmol/L   Hemoglobin 13.3  13.0 - 17.0 g/dL   HCT 21.3  08.6 - 57.8 %    EKG Results:  Date: 11/26/2012  Rate: 56  Rhythm: atrial fibrillation  QRS Axis: normal  Intervals: normal  ST/T Wave abnormalities: normal  Conduction Disutrbances:right bundle branch block  Narrative Interpretation: Atrial fibrillation with slow ventricular response, right bundle branch block.  Old EKG Reviewed: changes noted his rate is much slower than when he last had an EKG.  Assessment:  The primary encounter diagnosis was Neck pain. Diagnoses of Back pain, Dizziness, Depression, Lipoma, Acute renal failure, Bradycardia, and Atrial fibrillation were also pertinent to this visit.  I am most concerned about his bradycardia. I think this is the cause of some of his symptoms such as his dizziness and fatigue. It's possible he may be over medicated. It sounds like he will need to cut back on his dose of metoprolol. Additionally he may be due to digoxin toxic since he's on 0.25 mg per day which is a high dose for an elderly man in the first place, and in the second placed his creatinine and BUN are increasing. I'm also  concerned about his increasing BUN and creatinine and wondering about the possibility of acute on chronic renal failure. Finally, I think much of his symptomatology is due to depression. I think he needs a real antidepressant. I would recommend Remeron, starting at 15 mg per day at bedtime for this elderly gentleman and tapering off of the Xanax which is doing in no good and may be making him worse. Second of all, he needs a better pain medication, I think the Vicodin may be too strong for him and I would recommend tramadol.  He has a small lipoma on his posterior neck. I don't think this is the cause of his neck pain. He seems to think that it causes his dizziness. It could easily be removed by a general surgeon.  Plan:   1.  The following meds were prescribed:   New  Prescriptions   No medications on file   2.  The patient was transferred to the emergency department in stable condition via shuttle.  Medical Decision Making:  79 year old [place] male presents today with many complaints:  Neck pain, back pain, dizziness, fatigue, weakness, depression, crying, poor appetitie, weight loss, poor sleep.  On evaluation he has bradycardia with a rate of 56, orthostatic changes, and elevated glucose, BUN, and creatinine.  I believe his symptoms are multifactorial:  Arthritis, degenerative disc disease, and most importantly depression.  My chief concern tonight is his increasing BUN and creatinine and bradycardia.  He is on digoxin 0.25 mg daily which is a large dose for a man of his age, and I am concerned about digoxin toxicity.  I believe he needs to be monitored, have a dig level checked and have his medications adjusted.  Additionally, he needs an antidepressant (suggest Remeron)--he has been on Xanax "for depression."  And he also needs a less potent pain med (suggest tramadol)--has been on hydrocodone which his daughter feels makes him worse.        Reuben Likes, MD 11/26/12 2030

## 2012-11-26 NOTE — ED Notes (Signed)
Family at bedside. 

## 2012-11-26 NOTE — ED Notes (Signed)
Via daughter (interpreter)... Pt c/o back pain, neck pain and dizziness onset 4 weeks Saw Orthopedic last week and was referred to a pain clinic; PCP has Rx Vicodin that helps but makes him sleep for 8 hrs during the day Daughter doesn't want to keep giving him Vicodin Hx of arthritis  He is alert and oriented w/no signs of acute distress.

## 2012-11-26 NOTE — ED Notes (Signed)
Back and neck pan, dizziness; increase weakness.  Told to come to ED because of low heart and poor renal function.

## 2012-11-26 NOTE — ED Provider Notes (Signed)
History    CSN: [id number] rrival date & time 11/26/12  2024 First MD Initiated Contact with Patient 11/26/12 2102      Chief Complaint  Patient presents with  . Bradycardia  . Back Pain  . Dizziness   History obtained with the daughter translating HPI The patient presents to the emergency room with complaints of back pain, neck pain dizziness.  Patient has had lower back pain for years. He seen his primary Dr. and has visited an orthopedic Dr.  Over the last couple weeks however he has had trouble with dizziness and lightheadedness.  He feels weak at times when he is walking in trying to stand. He will feel as if he is going to pass out. He has had some pain in his chest and upper thoracic region as well.  Patient was hospitalized about 2 months ago with similar symptoms. Had a cardiac evaluation but was also found to have gallstones. He underwent laparoscopic cholecystectomy. Patient did develop C. Difficile.   The patient has been following up with his primary care doctor and specialist.   He went to the urgent care this evening because of persistent pain. He was sent to the emergency room for further Past Medical History  Diagnosis Date  . Permanent atrial fibrillation   . Hypertension   . HNP (herniated nucleus pulposus), lumbar   . PVD (peripheral vascular disease)   . Diabetes mellitus   . CAD (coronary artery disease)     s/p CABG x 4  . Ventricular hypertrophy   . Ischemic colitis   . Gastroenteritis, noninfectious   . Rectal bleeding     Past Surgical History  Procedure Laterality Date  . Cardiac catheterization  01/2002    40-50% distal LM/ostial LAD disease, 50-60% ostial LAD, 60% mid LAD, 30-40% D1, 70% tubular mid LCx, 70% ostial disease in two ramus intermedius branches, 70% prox RCA, 50-60% mid RCA, 70% distal RCA, LVEF 40%   . Coronary artery bypass graft  01/2002    x 4: LIMA-LAD, SVG-OM, SVG-PDA-PLB  . Cholecystectomy N/A 09/27/2012    Procedure: LAPAROSCOPIC  CHOLECYSTECTOMY WITH INTRAOPERATIVE CHOLANGIOGRAM;  Surgeon: Lodema Pilot, DO;  Location: [facility] OR;  Service: General;  Laterality: N/A;    Family History  Problem Relation Age of Onset  . Heart disease Mother   . Heart disease Sister   . Breast cancer Sister     History  Substance Use Topics  . Smoking status: Former Smoker -- 1.00 packs/day for 30 years    Types: Cigarettes    Quit date: 02/15/1991  . Smokeless tobacco: Not on file  . Alcohol Use: No      Review of Systems  Constitutional: Negative for fever.  Neurological: Positive for dizziness. Negative for seizures.  Psychiatric/Behavioral: Positive for dysphoric mood.  All other systems reviewed and are negative.    Allergies  Percocet  Home Medications   Current Outpatient Rx  Name  Route  Sig  Dispense  Refill  . ALPRAZolam (XANAX) 0.5 MG tablet   Oral   Take 0.5 mg by mouth 2 (two) times daily as needed for anxiety.         Marland Kitchen amLODipine (NORVASC) 10 MG tablet   Oral   Take 1 tablet (10 mg total) by mouth daily.   30 tablet   6   . aspirin EC 81 MG EC tablet   Oral   Take 1 tablet (81 mg total) by mouth daily.   30 tablet  0   . atorvastatin (LIPITOR) 20 MG tablet   Oral   Take 1 tablet (20 mg total) by mouth daily.   30 tablet   6   . digoxin (LANOXIN) 0.25 MG tablet   Oral   Take 1 tablet (250 mcg total) by mouth daily.   30 tablet   6   . enalapril (VASOTEC) 10 MG tablet   Oral   Take 1 tablet (10 mg total) by mouth daily.   30 tablet   6   . HYDROcodone-acetaminophen (NORCO/VICODIN) 5-325 MG per tablet   Oral   Take 1 tablet by mouth daily as needed for pain.         . metoprolol succinate (TOPROL-XL) 50 MG 24 hr tablet   Oral   Take 50 mg by mouth 2 (two) times daily. Take with or immediately following a meal. 8am and 6pm - twice daily dose per pharmacy and confirmed with daughter         . Nutritional Supplements (ENSURE PO)   Oral   Take 1 Can by mouth daily.          Marland Kitchen warfarin (COUMADIN) 5 MG tablet   Oral   Take 5 mg by mouth daily at 6 PM. 1 tablet (5 mg) on Monday, Wednesday, Friday, 1/2 tablet (2.5 mg) on all other days           BP 179/131  Pulse 54  Temp(Src) 98.7 F (37.1 C) (Oral)  Resp 16  SpO2 95%  Physical Exam  Nursing note and vitals reviewed. Constitutional: He appears well-developed and well-nourished. No distress.  HENT:  Head: Normocephalic and atraumatic.  Right Ear: External ear normal.  Left Ear: External ear normal.  Eyes: Conjunctivae are normal. Right eye exhibits no discharge. Left eye exhibits no discharge. No scleral icterus.  Neck: Neck supple. No tracheal deviation present.  Cardiovascular: Regular rhythm and intact distal pulses.  Bradycardia present.   On the monitor patient's heart rate dropped down into the 30s  Pulmonary/Chest: Effort normal and breath sounds normal. No stridor. No respiratory distress. He has no wheezes. He has no rales.  Abdominal: Soft. Bowel sounds are normal. He exhibits no distension. There is no tenderness. There is no rebound and no guarding.  Musculoskeletal: He exhibits no edema and no tenderness.  Neurological: He is alert. He has normal strength. No sensory deficit. Cranial nerve deficit:  no gross defecits noted. He exhibits normal muscle tone. He displays no seizure activity. Coordination normal.  Skin: Skin is warm and dry. No rash noted.  Psychiatric: He has a normal mood and affect.    ED Course  Procedures (including critical care time) EKG Atrial fibrillation rate 63 Right bundle branch block Rate slower since last tracing Labs Reviewed  CBC - Abnormal; Notable for the following:    WBC 12.3 (*)    Hemoglobin 12.8 (*)    HCT 37.0 (*)    All other components within normal limits  COMPREHENSIVE METABOLIC PANEL - Abnormal; Notable for the following:    Sodium 131 (*)    Chloride 94 (*)    Glucose, Bld 186 (*)    BUN 26 (*)    Creatinine, Ser 1.51 (*)     Total Bilirubin 1.4 (*)    GFR calc non Af Amer 42 (*)    GFR calc Af Amer 49 (*)    All other components within normal limits  PROTIME-INR - Abnormal; Notable for the following:  Prothrombin Time 25.8 (*)    INR 2.50 (*)    All other components within normal limits  APTT - Abnormal; Notable for the following:    aPTT 46 (*)    All other components within normal limits  POCT I-STAT 3, BLOOD GAS (G3+) - Abnormal; Notable for the following:    pH, Arterial 7.343 (*)    pCO2 arterial 45.6 (*)    pO2, Arterial 350.0 (*)    Bicarbonate 24.8 (*)    All other components within normal limits  DIGOXIN LEVEL  POCT I-STAT TROPONIN I   Dg Chest Portable 1 View  11/26/2012   *RADIOLOGY REPORT*  Clinical Data: Bradycardia.  PORTABLE CHEST - 1 VIEW  Comparison: 10/04/2012.  Findings: Stable enlarged cardiac silhouette and post CABG changes. Stable prominence of the interstitial markings.  Diffuse osteopenia.  IMPRESSION: Stable cardiomegaly and chronic interstitial lung disease.   Original Report Authenticated By: Beckie Salts, M.D.      MDM  Patient has several complaints. However he is complaining of intermittent episodes of weakness. He is in atrial fibrillation but his heart rate has got down into the 30s.  He has been complaining of intermittent chest pain as well however he recently had a normal nuclear medicine imaging test 2 months ago. With his bradycardia and complaints of weakness I will consult the medical service regarding admission for overnight observation. He may benefit from cardiac consultation        Celene Kras, MD 11/26/12 2316

## 2012-11-27 DIAGNOSIS — R42 Dizziness and giddiness: Secondary | ICD-10-CM | POA: Diagnosis present

## 2012-11-27 DIAGNOSIS — I4891 Unspecified atrial fibrillation: Secondary | ICD-10-CM

## 2012-11-27 LAB — COMPREHENSIVE METABOLIC PANEL
ALT: 16 U/L (ref 0–53)
Alkaline Phosphatase: 43 U/L (ref 39–117)
CO2: 22 mEq/L (ref 19–32)
Chloride: 103 mEq/L (ref 96–112)
GFR calc Af Amer: 57 mL/min — ABNORMAL LOW (ref >90)
GFR calc non Af Amer: 50 mL/min — ABNORMAL LOW (ref >90)
Glucose, Bld: 164 mg/dL — ABNORMAL HIGH (ref 70–99)
Potassium: 4.5 mEq/L (ref 3.5–5.1)
Sodium: 136 mEq/L (ref 135–145)

## 2012-11-27 LAB — GLUCOSE, CAPILLARY
Glucose-Capillary: 170 mg/dL — ABNORMAL HIGH (ref 70–99)
Glucose-Capillary: 197 mg/dL — ABNORMAL HIGH (ref 70–99)
Glucose-Capillary: 227 mg/dL — ABNORMAL HIGH (ref 70–99)
Glucose-Capillary: 291 mg/dL — ABNORMAL HIGH (ref 70–99)

## 2012-11-27 LAB — CBC WITH DIFFERENTIAL/PLATELET
Basophils Absolute: 0.1 10*3/uL (ref 0.0–0.1)
Basophils Relative: 1 % (ref 0–1)
Eosinophils Absolute: 0.1 10*3/uL (ref 0.0–0.7)
MCH: 29.4 pg (ref 26.0–34.0)
MCHC: 34.1 g/dL (ref 30.0–36.0)
Monocytes Absolute: 0.7 10*3/uL (ref 0.1–1.0)
Neutro Abs: 4 10*3/uL (ref 1.7–7.7)
Neutrophils Relative %: 62 % (ref 43–77)
RDW: 14.1 % (ref 11.5–15.5)

## 2012-11-27 LAB — PROTIME-INR: INR: 2.79 — ABNORMAL HIGH (ref 0.00–1.49)

## 2012-11-27 MED ORDER — HYDROCODONE-ACETAMINOPHEN 5-325 MG PO TABS
1.0000 | ORAL_TABLET | Freq: Three times a day (TID) | ORAL | Status: DC
  Administered 2012-11-27 – 2012-11-29 (×7): 1 via ORAL
  Filled 2012-11-27 (×8): qty 1

## 2012-11-27 MED ORDER — ENALAPRIL MALEATE 10 MG PO TABS
10.0000 mg | ORAL_TABLET | Freq: Every day | ORAL | Status: DC
  Administered 2012-11-27: 10 mg via ORAL
  Filled 2012-11-27 (×2): qty 1

## 2012-11-27 MED ORDER — ENSURE COMPLETE PO LIQD
237.0000 mL | Freq: Three times a day (TID) | ORAL | Status: DC
  Administered 2012-11-27 – 2012-11-30 (×11): 237 mL via ORAL

## 2012-11-27 MED ORDER — DOCUSATE SODIUM 100 MG PO CAPS
100.0000 mg | ORAL_CAPSULE | Freq: Two times a day (BID) | ORAL | Status: DC
  Administered 2012-11-27 – 2012-11-29 (×5): 100 mg via ORAL
  Filled 2012-11-27 (×7): qty 1

## 2012-11-27 MED ORDER — SODIUM CHLORIDE 0.9 % IJ SOLN
3.0000 mL | Freq: Two times a day (BID) | INTRAMUSCULAR | Status: DC
  Administered 2012-11-27 – 2012-11-29 (×5): 3 mL via INTRAVENOUS

## 2012-11-27 MED ORDER — SODIUM CHLORIDE 0.9 % IV SOLN
INTRAVENOUS | Status: DC
  Administered 2012-11-27 – 2012-11-28 (×2): via INTRAVENOUS

## 2012-11-27 MED ORDER — SODIUM CHLORIDE 0.9 % IV SOLN
INTRAVENOUS | Status: AC
  Administered 2012-11-27: 01:00:00 via INTRAVENOUS

## 2012-11-27 MED ORDER — ALPRAZOLAM 0.5 MG PO TABS
0.5000 mg | ORAL_TABLET | Freq: Three times a day (TID) | ORAL | Status: DC | PRN
  Administered 2012-11-27 – 2012-11-28 (×2): 0.5 mg via ORAL
  Filled 2012-11-27 (×2): qty 1

## 2012-11-27 MED ORDER — AMLODIPINE BESYLATE 10 MG PO TABS
10.0000 mg | ORAL_TABLET | Freq: Every day | ORAL | Status: DC
  Administered 2012-11-27 – 2012-11-30 (×4): 10 mg via ORAL
  Filled 2012-11-27 (×4): qty 1

## 2012-11-27 MED ORDER — SERTRALINE HCL 25 MG PO TABS
25.0000 mg | ORAL_TABLET | Freq: Every day | ORAL | Status: DC
  Administered 2012-11-27 – 2012-11-30 (×4): 25 mg via ORAL
  Filled 2012-11-27 (×4): qty 1

## 2012-11-27 MED ORDER — WARFARIN SODIUM 5 MG PO TABS
5.0000 mg | ORAL_TABLET | ORAL | Status: DC
  Filled 2012-11-27: qty 1

## 2012-11-27 MED ORDER — WARFARIN - PHARMACIST DOSING INPATIENT: Freq: Every day | Status: DC

## 2012-11-27 MED ORDER — METOPROLOL SUCCINATE ER 50 MG PO TB24
50.0000 mg | ORAL_TABLET | Freq: Two times a day (BID) | ORAL | Status: DC
  Filled 2012-11-27 (×2): qty 1

## 2012-11-27 MED ORDER — HYDROCODONE-ACETAMINOPHEN 5-325 MG PO TABS
1.0000 | ORAL_TABLET | ORAL | Status: DC | PRN
  Administered 2012-11-27: 2 via ORAL
  Filled 2012-11-27: qty 2

## 2012-11-27 MED ORDER — ENSURE PO LIQD: 1.0000 | Freq: Three times a day (TID) | ORAL | Status: DC

## 2012-11-27 MED ORDER — WARFARIN SODIUM 2.5 MG PO TABS
2.5000 mg | ORAL_TABLET | ORAL | Status: DC
  Administered 2012-11-27: 2.5 mg via ORAL
  Filled 2012-11-27: qty 1

## 2012-11-27 MED ORDER — ASPIRIN EC 81 MG PO TBEC
81.0000 mg | DELAYED_RELEASE_TABLET | Freq: Every day | ORAL | Status: DC
  Administered 2012-11-27 – 2012-11-30 (×4): 81 mg via ORAL
  Filled 2012-11-27 (×4): qty 1

## 2012-11-27 MED ORDER — ATORVASTATIN CALCIUM 20 MG PO TABS
20.0000 mg | ORAL_TABLET | Freq: Every day | ORAL | Status: DC
  Administered 2012-11-27 – 2012-11-30 (×4): 20 mg via ORAL
  Filled 2012-11-27 (×4): qty 1

## 2012-11-27 MED ORDER — GLYBURIDE 5 MG PO TABS
5.0000 mg | ORAL_TABLET | Freq: Every day | ORAL | Status: DC
  Administered 2012-11-28: 5 mg via ORAL
  Filled 2012-11-27 (×4): qty 1

## 2012-11-27 NOTE — Progress Notes (Addendum)
Patient admitted after midnight.Overall, HR somewhat improved, but per RN, still periods of bradycardia into the 30s.  Digoxin and metoprolol stopped.  Will monitor overnight and d/c in am if stable.  F/u with Croitoru as outpt.  Daughter reports pt is depressed and asking for antidepressant.  Patient agrees.  She also reports OA pain contributing to depression.  Will schedule tid vicodin

## 2012-11-27 NOTE — Progress Notes (Signed)
Pt's heart rate decreasing to the low 30's-40's while sleeping nonsustained. Patient asymptomatic and resting. Vital signs stable. Dr on call notified and made aware. No new orders obtained. Will continue to monitor patient.

## 2012-11-27 NOTE — ED Notes (Signed)
Admitting MD at bedside.

## 2012-11-27 NOTE — Progress Notes (Signed)
Notified Md Lendell Caprice that the patient's blood sugar have been elevated throughout the day and that he does not have any coverage. She ordered patient's glyburide 5mg  that he normally takes at home but hadn't been ordered since being hospitalized and said we will watch his blood sugars after reintroducing his med.

## 2012-11-27 NOTE — Progress Notes (Addendum)
ANTICOAGULATION CONSULT NOTE - Initial Consult  Pharmacy Consult for Coumadin Indication: atrial fibrillation  Allergies  Allergen Reactions  . Percocet (Oxycodone-Acetaminophen) Other (See Comments)    "almost a heart attack"    Patient Measurements:    Vital Signs: Temp: 98.7 F (37.1 C) (05/19 2101) Temp src: Oral (05/19 2101) BP: 169/60 mmHg (05/20 0030) Pulse Rate: 78 (05/20 0030)  Labs:  Recent Labs  11/26/12 1928 11/26/12 2133  HGB 13.3 12.8*  HCT 39.0 37.0*  PLT  --  188  APTT  --  46*  LABPROT  --  25.8*  INR  --  2.50*  CREATININE 1.40* 1.51*    The CrCl is unknown because both a height and weight (above a minimum accepted value) are required for this calculation.   Medical History: Past Medical History  Diagnosis Date  . Permanent atrial fibrillation   . Hypertension   . HNP (herniated nucleus pulposus), lumbar   . PVD (peripheral vascular disease)   . Diabetes mellitus   . CAD (coronary artery disease)     s/p CABG x 4  . Ventricular hypertrophy   . Ischemic colitis   . Gastroenteritis, noninfectious   . Rectal bleeding     Medications:  Prescriptions prior to admission  Medication Sig Dispense Refill  . ALPRAZolam (XANAX) 0.5 MG tablet Take 0.5 mg by mouth 2 (two) times daily as needed for anxiety.      Marland Kitchen amLODipine (NORVASC) 10 MG tablet Take 1 tablet (10 mg total) by mouth daily.  30 tablet  6  . aspirin EC 81 MG EC tablet Take 1 tablet (81 mg total) by mouth daily.  30 tablet  0  . atorvastatin (LIPITOR) 20 MG tablet Take 1 tablet (20 mg total) by mouth daily.  30 tablet  6  . digoxin (LANOXIN) 0.25 MG tablet Take 1 tablet (250 mcg total) by mouth daily.  30 tablet  6  . enalapril (VASOTEC) 10 MG tablet Take 1 tablet (10 mg total) by mouth daily.  30 tablet  6  . HYDROcodone-acetaminophen (NORCO/VICODIN) 5-325 MG per tablet Take 1 tablet by mouth daily as needed for pain.      . metoprolol succinate (TOPROL-XL) 50 MG 24 hr tablet Take  50 mg by mouth 2 (two) times daily. Take with or immediately following a meal. 8am and 6pm - twice daily dose per pharmacy and confirmed with daughter      . Nutritional Supplements (ENSURE PO) Take 1 Can by mouth daily.      Marland Kitchen warfarin (COUMADIN) 5 MG tablet Take 5 mg by mouth daily at 6 PM. 1 tablet (5 mg) on Monday, Wednesday, Friday, 1/2 tablet (2.5 mg) on all other days        Assessment: 79 yo male admitted with dizziness/bradycardia , h/o Afib, to continue Coumadin  Goal of Therapy:  INR 2-3 Monitor platelets by anticoagulation protocol: Yes   Plan:  Daily INR  Abbott, Gary Fleet 11/27/2012,1:12 AM  ____________________  11/27/2012 10:54 AM INR therapeutic.  Will restart home dose.  [employer], Pharm.D., BCPS Clinical Pharmacist Pager [phone]

## 2012-11-27 NOTE — Progress Notes (Addendum)
Inpatient Diabetes Program Recommendations  AACE/ADA: New Consensus Statement on Inpatient Glycemic Control (2013)  Target Ranges:  Prepandial:   less than 140 mg/dL      Peak postprandial:   less than 180 mg/dL (1-2 hours)      Critically ill patients:  140 - 180 mg/dL   Inpatient Diabetes Program Recommendations Correction (SSI): start Novolog sensitive scale TID + HS scale per glycemic control order set Add carbohydrate modified to diet Thank you  Piedad Climes BSN, RN,CDE Inpatient Diabetes Coordinator [phone] (team pager)

## 2012-11-27 NOTE — H&P (Signed)
History and Physical  [name] WUJ:[id number] DOB: 04-18-1932 DOA: 11/26/2012  Referring physician: Dr Lynelle Doctor PCP: Paulino Rily, MD   Chief Complaint: Dizziness  HPI: Patient is [place] speaking and I used the interpreter over the phone to communicate with him. 79 year old man with past medical history of prominent atrial fibrillation, hypertension, diabetes, coronary artery disease status post CABG, recent history of ischemic colitis and severe anxiety disorder who was sent by urgent care for evaluation of bradycardia in the 30s. Patient has had a swelling in his back for several years for which he went to urgent care. Patient was told that this does not need to be treated(as has been noted several times in the past as well). While taking vitals it was noted that patient's heart rate went into 30s and it was decided to send the patient to [facility] for further evaluation. Patient denied any complaints except the bump on his back. On further questioning he did say that he had an episode of dizziness followed by a fall 2 days prior to admission. Patient denies any chest pain, shortness of breath, abdominal pain, swelling in the extremities, orthopnea, PND.  Chart Review:  Patient had a normal nuclear scan for evaluation of chest pain.   Review of Systems:  15 point review of system was negative except what is noted above.  Past Medical History  Diagnosis Date  . Permanent atrial fibrillation   . Hypertension   . HNP (herniated nucleus pulposus), lumbar   . PVD (peripheral vascular disease)   . Diabetes mellitus   . CAD (coronary artery disease)     s/p CABG x 4  . Ventricular hypertrophy   . Ischemic colitis   . Gastroenteritis, noninfectious   . Rectal bleeding     Past Surgical History  Procedure Laterality Date  . Cardiac catheterization  01/2002    40-50% distal LM/ostial LAD disease, 50-60% ostial LAD, 60% mid LAD, 30-40% D1, 70% tubular mid LCx, 70% ostial disease in  two ramus intermedius branches, 70% prox RCA, 50-60% mid RCA, 70% distal RCA, LVEF 40%   . Coronary artery bypass graft  01/2002    x 4: LIMA-LAD, SVG-OM, SVG-PDA-PLB  . Cholecystectomy N/A 09/27/2012    Procedure: LAPAROSCOPIC CHOLECYSTECTOMY WITH INTRAOPERATIVE CHOLANGIOGRAM;  Surgeon: Lodema Pilot, DO;  Location: [facility] OR;  Service: General;  Laterality: N/A;    Social History:  reports that he quit smoking about 21 years ago. His smoking use included Cigarettes. He has a 30 pack-year smoking history. He does not have any smokeless tobacco history on file. He reports that he does not drink alcohol or use illicit drugs.  Allergies  Allergen Reactions  . Percocet (Oxycodone-Acetaminophen) Other (See Comments)    "almost a heart attack"    Family History  Problem Relation Age of Onset  . Heart disease Mother   . Heart disease Sister   . Breast cancer Sister      Prior to Admission medications   Medication Sig Start Date End Date Taking? Authorizing Provider  ALPRAZolam Prudy Feeler) 0.5 MG tablet Take 0.5 mg by mouth 2 (two) times daily as needed for anxiety.   Yes Historical Provider, MD  amLODipine (NORVASC) 10 MG tablet Take 1 tablet (10 mg total) by mouth daily. 11/26/12  Yes Mihai Croitoru, MD  aspirin EC 81 MG EC tablet Take 1 tablet (81 mg total) by mouth daily. 09/29/12  Yes Nishant Dhungel, MD  atorvastatin (LIPITOR) 20 MG tablet Take 1 tablet (20 mg total)  by mouth daily. 11/26/12  Yes Mihai Croitoru, MD  digoxin (LANOXIN) 0.25 MG tablet Take 1 tablet (250 mcg total) by mouth daily. 11/26/12  Yes Mihai Croitoru, MD  enalapril (VASOTEC) 10 MG tablet Take 1 tablet (10 mg total) by mouth daily. 11/26/12  Yes Mihai Croitoru, MD  HYDROcodone-acetaminophen (NORCO/VICODIN) 5-325 MG per tablet Take 1 tablet by mouth daily as needed for pain.   Yes Historical Provider, MD  metoprolol succinate (TOPROL-XL) 50 MG 24 hr tablet Take 50 mg by mouth 2 (two) times daily. Take with or immediately following  a meal. 8am and 6pm - twice daily dose per pharmacy and confirmed with daughter   Yes Historical Provider, MD  Nutritional Supplements (ENSURE PO) Take 1 Can by mouth daily.   Yes Historical Provider, MD  warfarin (COUMADIN) 5 MG tablet Take 5 mg by mouth daily at 6 PM. 1 tablet (5 mg) on Monday, Wednesday, Friday, 1/2 tablet (2.5 mg) on all other days   Yes Historical Provider, MD   Physical Exam: Filed Vitals:   11/26/12 2245 11/26/12 2300 11/27/12 0001 11/27/12 0030  BP: 171/64 179/131 186/79 169/60  Pulse: 100 54 74 78  Temp:      TempSrc:      Resp: 28 16 21 24   SpO2: 97% 95% 99% 97%   Physical Exam: General: Vital signs reviewed and noted. Well-developed, well-nourished, extremely anxious but not in any acute distress; alert, appropriate and cooperative throughout examination.  Head: Normocephalic, atraumatic.  Eyes: PERRL, EOMI, No signs of anemia or jaundince.  Nose: Mucous membranes moist, not inflammed, nonerythematous.  Throat: Oropharynx nonerythematous, no exudate appreciated.   Neck: 3 x 3 cm mobile mass noted, no tenderness noted.Supple, No carotid Bruits, no JVD.  Lungs:  Normal respiratory effort. Clear to auscultation BL without crackles or wheezes.  Heart:  irregularly irregular rhythm, S1 and S2 normal without gallop, murmur, or rubs.  Abdomen:  BS normoactive. Soft, Nondistended, non-tender.  No masses or organomegaly.  Extremities: No pretibial edema.  Neurologic: A&O X3, CN II - XII are grossly intact. Motor strength is 5/5 in the all 4 extremities, Sensations intact to light touch, Cerebellar signs negative.  Skin: No visible rashes, scars.     Wt Readings from Last 3 Encounters:  10/16/12 149 lb (67.586 kg)  10/06/12 152 lb 1.9 oz (69 kg)  09/29/12 149 lb 7.6 oz (67.8 kg)    Labs on Admission:  Basic Metabolic Panel:  Recent Labs Lab 11/26/12 1928 11/26/12 2133  NA 138 131*  K 4.8 4.6  CL 104 94*  CO2  --  25  GLUCOSE 210* 186*  BUN 29* 26*   CREATININE 1.40* 1.51*  CALCIUM  --  9.7    Liver Function Tests:  Recent Labs Lab 11/26/12 2133  AST 22  ALT 24  ALKPHOS 52  BILITOT 1.4*  PROT 8.0  ALBUMIN 4.3   CBC:  Recent Labs Lab 11/26/12 1928 11/26/12 2133  WBC  --  12.3*  HGB 13.3 12.8*  HCT 39.0 37.0*  MCV  --  87.1  PLT  --  188   Troponin (Point of Care Test)  Recent Labs  11/26/12 2137  TROPIPOC 0.00    Radiological Exams on Admission: Dg Chest Portable 1 View  11/26/2012   *RADIOLOGY REPORT*  Clinical Data: Bradycardia.  PORTABLE CHEST - 1 VIEW  Comparison: 10/04/2012.  Findings: Stable enlarged cardiac silhouette and post CABG changes. Stable prominence of the interstitial markings.  Diffuse osteopenia.  IMPRESSION: Stable cardiomegaly and chronic interstitial lung disease.   Original Report Authenticated By: Beckie Salts, M.D.    EKG: Independently reviewed. 56 beats per minute, normal axis, atrial fibrillation, no ST or T wave changes, complete right bundle branch block noted.   Principal Problem:   Dizziness Active Problems:   DIABETES MELLITUS   HYPERTENSION, BENIGN   FIBRILLATION, ATRIAL   Hx of CABG   Assessment/Plan Patient is a 79 year old man with past medical history as noted above who presented to urgent care for evaluation of bump in his neck but was noted to have bradycardia in the 30s. Patient denies any complaints at this time and was adamant to go home. I talked to patient's daughter and spent 20 minutes coordinating care. The doctor told me that she cannot take him up at this time and there was no way for him to go home after leaving AGAINST MEDICAL ADVICE. Eventually I decided to admit the patient overnight for observation.   Patient may benefit from a cardiology consult for evaluating for tachybrady syndrome and hence he may be a candidate for pacemaker if he is symptomatic with a history of fall 2 days ago.  -Admit to telemetry for observation -12 leads EKG in the  morning -IV fluids at 75 cc an hour for 12 hours -Continue home medications -Consult cardiology in the morning for evaluation of tachybrady syndrome -discontinue digoxin for now(due to bradycardia) -Continue home medications  Code Status: Full code Family Communication: Updated Disposition Plan/Anticipated LOS: Guarded  Time spent: 75 minutes spent in communicating with interpreter, coordinating care and documentation of assessment and plan.  Lars Mage, MD  [facility] Team 10 If 7PM-7AM, please contact night-coverage at [email or web address], password [facility] 11/27/2012, 12:49 AM

## 2012-11-27 NOTE — ED Notes (Signed)
Attempted to call report. Floor RN unable to accept report.  

## 2012-11-28 ENCOUNTER — Encounter (HOSPITAL_COMMUNITY): Payer: Self-pay | Admitting: Cardiology

## 2012-11-28 DIAGNOSIS — F32A Depression, unspecified: Secondary | ICD-10-CM | POA: Diagnosis present

## 2012-11-28 DIAGNOSIS — F329 Major depressive disorder, single episode, unspecified: Secondary | ICD-10-CM | POA: Diagnosis present

## 2012-11-28 DIAGNOSIS — E119 Type 2 diabetes mellitus without complications: Secondary | ICD-10-CM

## 2012-11-28 DIAGNOSIS — R531 Weakness: Secondary | ICD-10-CM | POA: Diagnosis present

## 2012-11-28 DIAGNOSIS — Z7901 Long term (current) use of anticoagulants: Secondary | ICD-10-CM

## 2012-11-28 DIAGNOSIS — N289 Disorder of kidney and ureter, unspecified: Secondary | ICD-10-CM | POA: Diagnosis present

## 2012-11-28 DIAGNOSIS — I4510 Unspecified right bundle-branch block: Secondary | ICD-10-CM | POA: Diagnosis present

## 2012-11-28 DIAGNOSIS — J849 Interstitial pulmonary disease, unspecified: Secondary | ICD-10-CM | POA: Diagnosis present

## 2012-11-28 DIAGNOSIS — I447 Left bundle-branch block, unspecified: Secondary | ICD-10-CM | POA: Insufficient documentation

## 2012-11-28 DIAGNOSIS — E785 Hyperlipidemia, unspecified: Secondary | ICD-10-CM | POA: Diagnosis present

## 2012-11-28 DIAGNOSIS — R001 Bradycardia, unspecified: Secondary | ICD-10-CM | POA: Diagnosis present

## 2012-11-28 LAB — PROTIME-INR: Prothrombin Time: 29.4 seconds — ABNORMAL HIGH (ref 11.6–15.2)

## 2012-11-28 LAB — GLUCOSE, CAPILLARY
Glucose-Capillary: 183 mg/dL — ABNORMAL HIGH (ref 70–99)
Glucose-Capillary: 216 mg/dL — ABNORMAL HIGH (ref 70–99)

## 2012-11-28 MED ORDER — ALPRAZOLAM 0.5 MG PO TABS: 0.5000 mg | ORAL_TABLET | Freq: Three times a day (TID) | ORAL | Status: DC | PRN

## 2012-11-28 MED ORDER — WARFARIN - PHARMACIST DOSING INPATIENT: Freq: Every day | Status: DC

## 2012-11-28 MED ORDER — GLIPIZIDE 2.5 MG HALF TABLET
2.5000 mg | ORAL_TABLET | Freq: Every day | ORAL | Status: DC
  Filled 2012-11-28: qty 1

## 2012-11-28 MED ORDER — GLIPIZIDE 2.5 MG HALF TABLET: 2.5000 mg | ORAL_TABLET | Freq: Every day | ORAL | Status: DC

## 2012-11-28 MED ORDER — GLIPIZIDE 2.5 MG HALF TABLET
2.5000 mg | ORAL_TABLET | Freq: Every day | ORAL | Status: DC
  Administered 2012-11-30: 2.5 mg via ORAL
  Filled 2012-11-28 (×2): qty 1

## 2012-11-28 MED ORDER — SERTRALINE HCL 25 MG PO TABS: 25.0000 mg | ORAL_TABLET | Freq: Every day | ORAL | Status: DC

## 2012-11-28 MED ORDER — HYDRALAZINE HCL 10 MG PO TABS
10.0000 mg | ORAL_TABLET | Freq: Three times a day (TID) | ORAL | Status: DC
  Administered 2012-11-28 – 2012-11-30 (×7): 10 mg via ORAL
  Filled 2012-11-28 (×9): qty 1

## 2012-11-28 MED ORDER — ENALAPRIL MALEATE 20 MG PO TABS
20.0000 mg | ORAL_TABLET | Freq: Every day | ORAL | Status: DC
  Administered 2012-11-28 – 2012-11-30 (×3): 20 mg via ORAL
  Filled 2012-11-28 (×3): qty 1

## 2012-11-28 MED ORDER — ENALAPRIL MALEATE 20 MG PO TABS: 20.0000 mg | ORAL_TABLET | Freq: Every day | ORAL | Status: DC

## 2012-11-28 NOTE — Progress Notes (Signed)
Pt had a 5 beat run of Vtach. Patient was asymptomatic and resting. Dr on call notified and made aware, will continue to monitor patient.

## 2012-11-28 NOTE — Consult Note (Addendum)
Pt. Seen and examined. Agree with the NP/PA-C note as written.  Pleasant 79 yo [place] speaking male who presented with an episode of marked symptomatic bradycardia from urgent care. He has had 2 weeks of weakness, anorexia and dizziness. On telemetry he was noted to have HR's in the 40's (although lower rates were reported in urgent care) with up to 2 second pauses. He has a history of a-fib, thought to be permanent. He had digoxin and b-blocker held and HR has increased today, however, his EKG shows a shifting block from RBBB to LBBB - the rhythm appears to be regular with atrial electrical activity which appear to be p-waves, although atypical flutter waves cannot be excluded. This does not appear to be slow VT.  He is resting comfortably in bed. Exam as per [facility]. No changes. The EKG suggests variable infra-Hisian block in the setting of a-fib which is concerning for significant conduction system disease. Given his symptoms, I feel that he would benefit from a pacemaker. I called and discussed the case with his daughter, who will try to arrange to be off work tomorrow to meet with Dr. Salena Saner and discuss the procedure/provide consent and translate for the patient.  Please keep NPO p midnight. Hold warfarin tonight - ok to do pacer on warfarin, but INR closer to 2.0 is preferred (was 2.98 today).  We will happy to assume care of the patient on our service.  Chrystie Nose, MD, [facility] Attending Cardiologist [facility]

## 2012-11-28 NOTE — Progress Notes (Signed)
Referral received today for SNF Placement. Per RNCM this pt is observaton and will have to go home with [facility].  Signing off of this pt.   Sherald Barge, LCSW-A Clinical Social Worker [phone]

## 2012-11-28 NOTE — Progress Notes (Addendum)
[facility] PROGRESS NOTE  [name] ZOX:[id number] DOB: 1931-12-15 DOA: 11/26/2012 PCP: Paulino Rily, MD  Assessment/Plan  [name] was admitted to [facility] with lightheadedness and was found to have bradycardia to the 30s with pauses in heart beat around 2 seconds long. He was placed on telemetry and his digoxin and beta blocker were held. His heart rate increased from resting HR in the 30s/40s to the 70s with atrial fibrillation as baseline rhythm. His INR was therapeutic. He continue to have intermittent pauses up to 2 seconds long, but they were less frequent and his lightheadedness improved. He was hydrated and his repeat orthostatics were negative.    New intermittent LBBB superimposed on baseline RBBB -  Cardiology consultation -  PT recommended vestibular rehab likely at SNF  HTN: His blood pressure rose, likely as a result of holding some of his blood pressure medications (beta blocker).  He should follow up with his primary care doctor within 1 week for repeat creatinine and potassium and blood pressure check.   -  Increase ACEI -  Add hydralazine  DM: His fingersticks were elevated to the 200s, however, his sulfonylurea was held at admission. It was restarted and he should follow up with his primary care doctor for further management.  -  Given his CKD, will change his SU from glyburide to glipizide.   Chronic pain: He was initially given as needed vicodin, however, he did not receive this medication very regularly initially, so it was scheduled. His low back pain and leg pain improved somewhat.   Depression, may be worsening patients underlying pain issues. Was started on sertraline.   Diet:  Healthy heart Access:  PIV IVF:  KVO Proph:  coumadin  Code Status: full  Family Communication: spoke with patietn alone.  Left message for daughter to call Disposition Plan:  PT recommending  SNF   Consultants:  Cardiology  PT  Procedures:  none  Antibiotics:  none   HPI/Subjective:  History through [place] interpreter. Lightheadedness improved, however, worse when he stands up. He denies CP, palpitations, SOB, N/V. Voiding and BMs are normal.    Objective: Filed Vitals:   11/28/12 0519 11/28/12 0521 11/28/12 0523 11/28/12 0525  BP: 151/56 158/72 185/65 179/73  Pulse: 70 71 77 74  Temp: 97.9 F (36.6 C)     TempSrc: Oral     Resp: 18     Height:      Weight:      SpO2: 96% 96% 96% 95%    Intake/Output Summary (Last 24 hours) at 11/28/12 1216 Last data filed at 11/27/12 1721  Gross per 24 hour  Intake    480 ml  Output      0 ml  Net    480 ml   Filed Weights   11/27/12 0120  Weight: 68.629 kg (151 lb 4.8 oz)    Exam:  General: Thin CM, no acute distress, sitting at edge of bed eating breakfast  HEENT: NCAT, MMM  Cardiovascular: IRRR, no mrg, 1+ radial pulses, warm extremities.  Respiratory: CTAB  ABD: Soft, ND/NT, NABS.  MSK: No LEE, brace on right ankle.    Data Reviewed: Basic Metabolic Panel:  Recent Labs Lab 11/26/12 1928 11/26/12 2133 11/27/12 0554  NA 138 131* 136  K 4.8 4.6 4.5  CL 104 94* 103  CO2  --  25 22  GLUCOSE 210* 186* 164*  BUN 29* 26* 24*  CREATININE 1.40* 1.51* 1.33  CALCIUM  --  9.7 8.8  Liver Function Tests:  Recent Labs Lab 11/26/12 2133 11/27/12 0554  AST 22 16  ALT 24 16  ALKPHOS 52 43  BILITOT 1.4* 1.2  PROT 8.0 6.7  ALBUMIN 4.3 3.2*   No results found for this basename: LIPASE, AMYLASE,  in the last 168 hours No results found for this basename: AMMONIA,  in the last 168 hours CBC:  Recent Labs Lab 11/26/12 1928 11/26/12 2133 11/27/12 0554  WBC  --  12.3* 6.5  NEUTROABS  --   --  4.0  HGB 13.3 12.8* 11.3*  HCT 39.0 37.0* 33.1*  MCV  --  87.1 86.2  PLT  --  188 159   Cardiac Enzymes: No results found for this basename: CKTOTAL, CKMB, CKMBINDEX, TROPONINI,  in the last 168  hours BNP (last 3 results) No results found for this basename: PROBNP,  in the last 8760 hours CBG:  Recent Labs Lab 11/27/12 1140 11/27/12 1636 11/27/12 2118 11/28/12 0729 11/28/12 1145  GLUCAP 197* 227* 291* 222* 254*    No results found for this or any previous visit (from the past 240 hour(s)).   Studies: No results found.  Scheduled Meds: . amLODipine  10 mg Oral Daily  . aspirin EC  81 mg Oral Daily  . atorvastatin  20 mg Oral q1800  . docusate sodium  100 mg Oral BID  . enalapril  20 mg Oral Daily  . feeding supplement  237 mL Oral TID BM  . glyBURIDE  5 mg Oral Q breakfast  . HYDROcodone-acetaminophen  1 tablet Oral TID  . sertraline  25 mg Oral Daily  . sodium chloride  3 mL Intravenous Q12H  . warfarin  2.5 mg Oral Q T,Th,S,Su-1800  . warfarin  5 mg Oral Q M,W,F-1800  . Warfarin - Pharmacist Dosing Inpatient   Does not apply q1800   Continuous Infusions: . sodium chloride 75 mL/hr at 11/28/12 0749    Principal Problem:   Dizziness Active Problems:   DIABETES MELLITUS   HYPERTENSION, BENIGN   FIBRILLATION, ATRIAL   Hx of CABG    Time spent: 30 min    [name], [facility]  [facility] Pager [phone]. If 7PM-7AM, please contact night-coverage at [email or web address], password [facility] 11/28/2012, 12:16 PM  LOS: 2 days

## 2012-11-28 NOTE — Progress Notes (Signed)
Physical Therapy Evaluation Patient Details Name: [name] MRN: [id number] DOB: [birth year] Today's Date: 11/28/2012 Time: 0865-7846 PT Time Calculation (min): 53 min  PT Assessment / Plan / Recommendation Clinical Impression  Pt s/p dizziness with apparent vertigo.  Per testing, treated for LBPPV but pt also with left vestibular hypofunction.  Will benefit from > therapy with pt in agreement that he needs therapy and RW.  He agreed he was unsteady as well.  Spoke with Dr. Malachi Bonds who is to call daughter and inform her of PT recommendation for SNF with vestibular rehab.      PT Assessment  Patient needs continued PT services    Follow Up Recommendations  SNF;Supervision/Assistance - 24 hour        Barriers to Discharge Decreased caregiver support      Equipment Recommendations  Rolling walker with 5" wheels         Frequency Min 3X/week    Precautions / Restrictions Precautions Precautions: Fall Restrictions Weight Bearing Restrictions: No   Pertinent Vitals/Pain VSS, No pain      Mobility  Bed Mobility Bed Mobility: Rolling Left;Left Sidelying to Sit;Sitting - Scoot to Edge of Bed;Sit to Sidelying Left Rolling Left: 5: Supervision Left Sidelying to Sit: 5: Supervision Sitting - Scoot to Edge of Bed: 5: Supervision Sit to Sidelying Left: 5: Supervision Details for Bed Mobility Assistance: Given that pt has symptoms possibly of vertigo, performed vestibular assessment.  Positive Modif Hallpike test to left and treated for LBPPV.  Also feel that pt has significant left hypofunction and initiated x 1 exercises.  Pt needed constant cuing for exercises as he had trouble due to dizziness.  He also kept asking why we couldnt give him medication to "fix it".  PT explained to pt that  only the exercises will help.  He seemed to understand and would do the exercises and then ask again to give him a medication and PT through translator would tell him the reasoning for exercise  again.   Transfers Transfers: Sit to Stand;Stand to Sit Sit to Stand: 4: Min assist;With upper extremity assist;From bed Stand to Sit: 4: Min assist;With upper extremity assist;To chair/3-in-1;With armrests Details for Transfer Assistance: Pt needs steadying assist when he stands due to dizziness.   Ambulation/Gait Ambulation/Gait Assistance: 4: Min assist Ambulation Distance (Feet): 175 Feet Assistive device: Rolling walker Ambulation/Gait Assistance Details: Pt ambulated without RW with mod assist and several LOB as pt shuffles and has poor postural stability.  With RW, pt min-min guard assist and does much better.  Needed cues to stay close to RW as pt wanted to push it too far in front of him consistently.  Pt taking better stepsin RW as well.  Pt stated he agreed he needed to use RW via translator.  He also stated to her he was unsteady.   Gait Pattern: Step-to pattern;Shuffle;Trunk flexed;Decreased step length - left;Decreased step length - right;Decreased stride length Gait velocity: decreased Stairs: No Wheelchair Mobility Wheelchair Mobility: No    Exercises Other Exercises Other Exercises: x1 exercises side to side 5 reps with pt able to perform up to 30 seconds. with dizzy 7/10   PT Diagnosis: Difficulty walking;Generalized weakness;Other (comment) (dizziness)  PT Problem List: Decreased activity tolerance;Decreased balance;Decreased mobility;Decreased knowledge of use of DME;Decreased safety awareness;Decreased knowledge of precautions;Other (comment) (dizziness) PT Treatment Interventions: DME instruction;Gait training;Functional mobility training;Therapeutic activities;Stair training;Therapeutic exercise;Balance training;Patient/family education (canalith repositioning and gaze stabilization exercise)   PT Goals Acute Rehab PT Goals PT Goal Formulation: With  patient Time For Goal Achievement: 12/12/12 Potential to Achieve Goals: Good Pt will go Sit to Stand:  Independently PT Goal: Sit to Stand - Progress: Goal set today Pt will Ambulate: 51 - 150 feet;with supervision;with least restrictive assistive device PT Goal: Ambulate - Progress: Goal set today Pt will Go Up / Down Stairs: Flight;with supervision;with least restrictive assistive device PT Goal: Up/Down Stairs - Progress: Goal set today Pt will Perform Home Exercise Program: with supervision, verbal cues required/provided PT Goal: Perform Home Exercise Program - Progress: Goal set today  Visit Information  Last PT Received On: 11/28/12 Assistance Needed: +1    Subjective Data  Subjective: Used translator [phone].  "I just want medications to fix it." Patient Stated Goal: to go home   Prior Functioning  Home Living Lives With: Daughter Available Help at Discharge: Available PRN/intermittently;Other (Comment);Family (daughter works) Type of Home: House Home Access: Stairs to enter Secretary/administrator of Steps: 4 Entrance Stairs-Rails: Right Home Layout: Two level;Able to live on main level with bedroom/bathroom Alternate Level Stairs-Number of Steps: flight Alternate Level Stairs-Rails: Can reach both Bathroom Shower/Tub: Engineer, manufacturing systems: Standard Home Adaptive Equipment: Straight cane Prior Function Level of Independence: Independent with assistive device(s) Able to Take Stairs?: Yes Driving: No Vocation: Retired Musician: Other (comment);Prefers language other than English Administrator used)    Copywriter, advertising Arousal/Alertness: Awake/alert Behavior During Therapy: WFL for tasks assessed/performed Overall Cognitive Status: Difficult to assess Difficult to assess due to: Non-English speaking    Extremity/Trunk Assessment Right Lower Extremity Assessment RLE ROM/Strength/Tone: [facility] for tasks assessed Left Lower Extremity Assessment LLE ROM/Strength/Tone: [facility] for tasks assessed Trunk Assessment Trunk Assessment:  Kyphotic   Balance Static Standing Balance Static Standing - Balance Support: Bilateral upper extremity supported;During functional activity Static Standing - Level of Assistance: 5: Stand by assistance Static Standing - Comment/# of Minutes: 2 minutes with RW  End of Session PT - End of Session Equipment Utilized During Treatment: Gait belt Activity Tolerance: Patient tolerated treatment well Patient left: in chair;with call bell/phone within reach Nurse Communication: Mobility status  GP Functional Assessment Tool Used: clinical judgement Functional Limitation: Mobility: Walking and moving around Mobility: Walking and Moving Around Current Status [phone]): At least 20 percent but less than 40 percent impaired, limited or restricted Mobility: Walking and Moving Around Goal Status [phone]): At least 1 percent but less than 20 percent impaired, limited or restricted   INGOLD,Calix Heinbaugh 11/28/2012, 12:17 PM [facility] Acute Rehabilitation [phone] (pager)

## 2012-11-28 NOTE — Consult Note (Signed)
Reason for Consult: Bradycardia  Requesting Physician: Claybon Jabs  HPI: This is a 79 y.o. male from [place] with a past medical history significant for CAD s/p CABG X 4 in '04, CAF on Coumadin, PVD, DM, RBBB, dyslipidemia, and severe depression. He is followed by Dr Royann Shivers. He was recently evaluated in the hospital March of 2013 when he had acute cholecystis. He had a Myoview at that time that showed scar but no ischemia. Echo showed preserved LVF. He has severe PVD and claudication. Dr Royann Shivers just saw him 11/15/12 and he was to have an OP PVA with Dr Allyson Sabal.       He was admitted 11/27/12 with bradycardia from Urgent Care. He had presented there with complaints of 2 weeks of weakness, anorexia, and slow HR. His EKG showed his HR to 45. He had had pauses up to 2 seconds. His Lanoxin and Toprol were stopped on admission. He has a baseline RBBB and this AM converted to a LBBB. He has a history of depression and has somewhat of a flat affect. His daughter says he has not had any angina.  His Troponin is negative X 1.  PMHx:  Past Medical History  Diagnosis Date  . Permanent atrial fibrillation   . Hypertension   . HNP (herniated nucleus pulposus), lumbar   . PVD (peripheral vascular disease)   . Diabetes mellitus   . CAD (coronary artery disease)     s/p CABG x 4  . Ventricular hypertrophy   . Ischemic colitis   . Gastroenteritis, noninfectious   . Rectal bleeding     presumed secondary to ischemic colitis  . Depression    Past Surgical History  Procedure Laterality Date  . Cardiac catheterization  01/2002    40-50% distal LM/ostial LAD disease, 50-60% ostial LAD, 60% mid LAD, 30-40% D1, 70% tubular mid LCx, 70% ostial disease in two ramus intermedius branches, 70% prox RCA, 50-60% mid RCA, 70% distal RCA, LVEF 40%   . Coronary artery bypass graft  01/2002    x 4: LIMA-LAD, SVG-OM, SVG-PDA-PLB  . Cholecystectomy N/A 09/27/2012    Procedure: LAPAROSCOPIC CHOLECYSTECTOMY WITH  INTRAOPERATIVE CHOLANGIOGRAM;  Surgeon: Lodema Pilot, DO;  Location: [facility] OR;  Service: General;  Laterality: N/A;    FAMHx: Family History  Problem Relation Age of Onset  . Heart disease Mother   . Heart disease Sister   . Breast cancer Sister     SOCHx:  reports that he quit smoking about 21 years ago. His smoking use included Cigarettes. He has a 30 pack-year smoking history. He does not have any smokeless tobacco history on file. He reports that he does not drink alcohol or use illicit drugs.  ALLERGIES: Allergies  Allergen Reactions  . Percocet (Oxycodone-Acetaminophen) Other (See Comments)    "almost a heart attack"    ROS: Pertinent items are noted in HPI. The pt's daughter who provided history says he has not had GI bleeding or vomiting.   HOME MEDICATIONS: Prescriptions prior to admission  Medication Sig Dispense Refill  . ALPRAZolam (XANAX) 0.5 MG tablet Take 0.5 mg by mouth 2 (two) times daily as needed for anxiety.      Marland Kitchen amLODipine (NORVASC) 10 MG tablet Take 1 tablet (10 mg total) by mouth daily.  30 tablet  6  . aspirin EC 81 MG EC tablet Take 1 tablet (81 mg total) by mouth daily.  30 tablet  0  . atorvastatin (LIPITOR) 20 MG tablet Take 1 tablet (20 mg total)  by mouth daily.  30 tablet  6  . HYDROcodone-acetaminophen (NORCO/VICODIN) 5-325 MG per tablet Take 1 tablet by mouth daily as needed for pain.      . Nutritional Supplements (ENSURE PO) Take 1 Can by mouth daily.      Marland Kitchen warfarin (COUMADIN) 5 MG tablet Take 5 mg by mouth daily at 6 PM. 1 tablet (5 mg) on Monday, Wednesday, Friday, 1/2 tablet (2.5 mg) on all other days      . [DISCONTINUED] digoxin (LANOXIN) 0.25 MG tablet Take 1 tablet (250 mcg total) by mouth daily.  30 tablet  6  . [DISCONTINUED] enalapril (VASOTEC) 10 MG tablet Take 1 tablet (10 mg total) by mouth daily.  30 tablet  6  . [DISCONTINUED] metoprolol succinate (TOPROL-XL) 50 MG 24 hr tablet Take 50 mg by mouth 2 (two) times daily. Take with or  immediately following a meal. 8am and 6pm - twice daily dose per pharmacy and confirmed with daughter        HOSPITAL MEDICATIONS: I have reviewed the patient's current medications.  VITALS: Blood pressure 179/73, pulse 74, temperature 97.9 F (36.6 C), temperature source Oral, resp. rate 18, height 5\' 5"  (1.651 m), weight 68.629 kg (151 lb 4.8 oz), SpO2 95.00%.  PHYSICAL EXAM: General appearance: alert, cooperative and no distress Neck: sift bruits Lungs: basilar crackles Heart: irregularly irregular rhythm and 2/6 systolic murmur Abdomen: soft, non-tender; bowel sounds normal; no masses,  no organomegaly and soft bruit Extremities: no edema, bifemoral bruits Pulses: diminnished Skin: cool and dry Neurologic: Grossly normal  LABS: Results for orders placed during the hospital encounter of 11/26/12 (from the past 48 hour(s))  CBC     Status: Abnormal   Collection Time    11/26/12  9:33 PM      Result Value Range   WBC 12.3 (*) 4.0 - 10.5 K/uL   RBC 4.25  4.22 - 5.81 MIL/uL   Hemoglobin 12.8 (*) 13.0 - 17.0 g/dL   HCT 40.9 (*) 81.1 - 91.4 %   MCV 87.1  78.0 - 100.0 fL   MCH 30.1  26.0 - 34.0 pg   MCHC 34.6  30.0 - 36.0 g/dL   RDW 78.2  95.6 - 21.3 %   Platelets 188  150 - 400 K/uL  COMPREHENSIVE METABOLIC PANEL     Status: Abnormal   Collection Time    11/26/12  9:33 PM      Result Value Range   Sodium 131 (*) 135 - 145 mEq/L   Comment: DELTA CHECK NOTED   Potassium 4.6  3.5 - 5.1 mEq/L   Chloride 94 (*) 96 - 112 mEq/L   CO2 25  19 - 32 mEq/L   Glucose, Bld 186 (*) 70 - 99 mg/dL   BUN 26 (*) 6 - 23 mg/dL   Creatinine, Ser 0.86 (*) 0.50 - 1.35 mg/dL   Calcium 9.7  8.4 - 57.8 mg/dL   Total Protein 8.0  6.0 - 8.3 g/dL   Albumin 4.3  3.5 - 5.2 g/dL   AST 22  0 - 37 U/L   ALT 24  0 - 53 U/L   Alkaline Phosphatase 52  39 - 117 U/L   Total Bilirubin 1.4 (*) 0.3 - 1.2 mg/dL   GFR calc non Af Amer 42 (*) >90 mL/min   GFR calc Af Amer 49 (*) >90 mL/min   Comment:             The eGFR has been calculated  using the CKD EPI equation.     This calculation has not been     validated in all clinical     situations.     eGFR's persistently     <90 mL/min signify     possible Chronic Kidney Disease.  PROTIME-INR     Status: Abnormal   Collection Time    11/26/12  9:33 PM      Result Value Range   Prothrombin Time 25.8 (*) 11.6 - 15.2 seconds   INR 2.50 (*) 0.00 - 1.49  APTT     Status: Abnormal   Collection Time    11/26/12  9:33 PM      Result Value Range   aPTT 46 (*) 24 - 37 seconds   Comment:            IF BASELINE aPTT IS ELEVATED,     SUGGEST PATIENT RISK ASSESSMENT     BE USED TO DETERMINE APPROPRIATE     ANTICOAGULANT THERAPY.  POCT I-STAT TROPONIN I     Status: None   Collection Time    11/26/12  9:37 PM      Result Value Range   Troponin i, poc 0.00  0.00 - 0.08 ng/mL   Comment 3            Comment: Due to the release kinetics of cTnI,     a negative result within the first hours     of the onset of symptoms does not rule out     myocardial infarction with certainty.     If myocardial infarction is still suspected,     repeat the test at appropriate intervals.  POCT I-STAT 3, BLOOD GAS (G3+)     Status: Abnormal   Collection Time    11/26/12  9:59 PM      Result Value Range   pH, Arterial 7.343 (*) 7.350 - 7.450   pCO2 arterial 45.6 (*) 35.0 - 45.0 mmHg   pO2, Arterial 350.0 (*) 80.0 - 100.0 mmHg   Bicarbonate 24.8 (*) 20.0 - 24.0 mEq/L   TCO2 26  0 - 100 mmol/L   O2 Saturation 100.0     Acid-base deficit 1.0  0.0 - 2.0 mmol/L   Patient temperature 98.6 F     Collection site RADIAL, ALLEN'S TEST ACCEPTABLE     Drawn by RT     Sample type ARTERIAL    DIGOXIN LEVEL     Status: None   Collection Time    11/26/12 10:51 PM      Result Value Range   Digoxin Level 1.9  0.8 - 2.0 ng/mL  GLUCOSE, CAPILLARY     Status: Abnormal   Collection Time    11/27/12  1:24 AM      Result Value Range   Glucose-Capillary 170 (*) 70 - 99  mg/dL  COMPREHENSIVE METABOLIC PANEL     Status: Abnormal   Collection Time    11/27/12  5:54 AM      Result Value Range   Sodium 136  135 - 145 mEq/L   Potassium 4.5  3.5 - 5.1 mEq/L   Chloride 103  96 - 112 mEq/L   CO2 22  19 - 32 mEq/L   Glucose, Bld 164 (*) 70 - 99 mg/dL   BUN 24 (*) 6 - 23 mg/dL   Creatinine, Ser 1.61  0.50 - 1.35 mg/dL   Calcium 8.8  8.4 - 09.6 mg/dL   Total Protein 6.7  6.0 -  8.3 g/dL   Albumin 3.2 (*) 3.5 - 5.2 g/dL   AST 16  0 - 37 U/L   ALT 16  0 - 53 U/L   Alkaline Phosphatase 43  39 - 117 U/L   Total Bilirubin 1.2  0.3 - 1.2 mg/dL   GFR calc non Af Amer 50 (*) >90 mL/min   GFR calc Af Amer 57 (*) >90 mL/min   Comment:            The eGFR has been calculated     using the CKD EPI equation.     This calculation has not been     validated in all clinical     situations.     eGFR's persistently     <90 mL/min signify     possible Chronic Kidney Disease.  CBC WITH DIFFERENTIAL     Status: Abnormal   Collection Time    11/27/12  5:54 AM      Result Value Range   WBC 6.5  4.0 - 10.5 K/uL   RBC 3.84 (*) 4.22 - 5.81 MIL/uL   Hemoglobin 11.3 (*) 13.0 - 17.0 g/dL   HCT 47.8 (*) 29.5 - 62.1 %   MCV 86.2  78.0 - 100.0 fL   MCH 29.4  26.0 - 34.0 pg   MCHC 34.1  30.0 - 36.0 g/dL   RDW 30.8  65.7 - 84.6 %   Platelets 159  150 - 400 K/uL   Neutrophils Relative % 62  43 - 77 %   Neutro Abs 4.0  1.7 - 7.7 K/uL   Lymphocytes Relative 25  12 - 46 %   Lymphs Abs 1.6  0.7 - 4.0 K/uL   Monocytes Relative 11  3 - 12 %   Monocytes Absolute 0.7  0.1 - 1.0 K/uL   Eosinophils Relative 1  0 - 5 %   Eosinophils Absolute 0.1  0.0 - 0.7 K/uL   Basophils Relative 1  0 - 1 %   Basophils Absolute 0.1  0.0 - 0.1 K/uL  PROTIME-INR     Status: Abnormal   Collection Time    11/27/12  5:54 AM      Result Value Range   Prothrombin Time 28.0 (*) 11.6 - 15.2 seconds   INR 2.79 (*) 0.00 - 1.49  GLUCOSE, CAPILLARY     Status: Abnormal   Collection Time    11/27/12  7:37 AM       Result Value Range   Glucose-Capillary 148 (*) 70 - 99 mg/dL  GLUCOSE, CAPILLARY     Status: Abnormal   Collection Time    11/27/12 11:40 AM      Result Value Range   Glucose-Capillary 197 (*) 70 - 99 mg/dL  GLUCOSE, CAPILLARY     Status: Abnormal   Collection Time    11/27/12  4:36 PM      Result Value Range   Glucose-Capillary 227 (*) 70 - 99 mg/dL  GLUCOSE, CAPILLARY     Status: Abnormal   Collection Time    11/27/12  9:18 PM      Result Value Range   Glucose-Capillary 291 (*) 70 - 99 mg/dL   Comment 1 Notify RN    PROTIME-INR     Status: Abnormal   Collection Time    11/28/12  5:15 AM      Result Value Range   Prothrombin Time 29.4 (*) 11.6 - 15.2 seconds   INR 2.98 (*) 0.00 - 1.49  GLUCOSE,  CAPILLARY     Status: Abnormal   Collection Time    11/28/12  7:29 AM      Result Value Range   Glucose-Capillary 222 (*) 70 - 99 mg/dL  GLUCOSE, CAPILLARY     Status: Abnormal   Collection Time    11/28/12 11:45 AM      Result Value Range   Glucose-Capillary 254 (*) 70 - 99 mg/dL    EKG:  AF with RBBB on admission, LBBB with ? Atrial tachycardia this am, now back in AF with RBBB  IMAGING: PORTABLE CHEST - 1 VIEW  Comparison: 10/04/2012.  Findings: Stable enlarged cardiac silhouette and post CABG changes.  Stable prominence of the interstitial markings. Diffuse  osteopenia.  IMPRESSION:  Stable cardiomegaly and chronic interstitial lung disease.    IMPRESSION: Principal Problem:   Bradycardia Active Problems:   Dizziness   Weakness   DIABETES MELLITUS   Permanent atrial fibrillation   PVD WITH CLAUDICATION- he was going to be set up for OP PVA   CAD, CABG X 4 2003. Myoview low risk 3/14   Acute renal insufficiency on admission- SCr 1.5   HYPERTENSION, BENIGN   Unsteady gait   Ischemic colitis March 2014   Depression   Dyslipidemia   Chronic anticoagulation   Chronic interstitial lung disease   RECOMMENDATION: Consider resuming beta blocker at lower  dose. Will review with Dr Royann Shivers- he may need pacemaker.   Time Spent Directly with Patient: 50 minutes  Abelino Derrick [phone] beeper 11/28/2012, 2:04 PM

## 2012-11-28 NOTE — Progress Notes (Signed)
ANTICOAGULATION CONSULT NOTE - Initial Consult  Pharmacy Consult for Coumadin Indication: atrial fibrillation  Allergies  Allergen Reactions  . Percocet (Oxycodone-Acetaminophen) Other (See Comments)    "almost a heart attack"    Patient Measurements: Height: 5\' 5"  (165.1 cm) Weight: 151 lb 4.8 oz (68.629 kg) IBW/kg (Calculated) : 61.5  Vital Signs: Temp: 97.9 F (36.6 C) (05/21 0519) Temp src: Oral (05/21 0519) BP: 179/73 mmHg (05/21 0525) Pulse Rate: 74 (05/21 0525)  Labs:  Recent Labs  11/26/12 1928 11/26/12 2133 11/27/12 0554 11/28/12 0515  HGB 13.3 12.8* 11.3*  --   HCT 39.0 37.0* 33.1*  --   PLT  --  188 159  --   APTT  --  46*  --   --   LABPROT  --  25.8* 28.0* 29.4*  INR  --  2.50* 2.79* 2.98*  CREATININE 1.40* 1.51* 1.33  --     Estimated Creatinine Clearance: 39.8 ml/min (by C-G formula based on Cr of 1.33).   Medical History: Past Medical History  Diagnosis Date  . Permanent atrial fibrillation   . Hypertension   . HNP (herniated nucleus pulposus), lumbar   . PVD (peripheral vascular disease)   . Diabetes mellitus   . CAD (coronary artery disease)     s/p CABG x 4  . Ventricular hypertrophy   . Ischemic colitis   . Gastroenteritis, noninfectious   . Rectal bleeding     Medications:  Prescriptions prior to admission  Medication Sig Dispense Refill  . ALPRAZolam (XANAX) 0.5 MG tablet Take 0.5 mg by mouth 2 (two) times daily as needed for anxiety.      Marland Kitchen amLODipine (NORVASC) 10 MG tablet Take 1 tablet (10 mg total) by mouth daily.  30 tablet  6  . aspirin EC 81 MG EC tablet Take 1 tablet (81 mg total) by mouth daily.  30 tablet  0  . atorvastatin (LIPITOR) 20 MG tablet Take 1 tablet (20 mg total) by mouth daily.  30 tablet  6  . HYDROcodone-acetaminophen (NORCO/VICODIN) 5-325 MG per tablet Take 1 tablet by mouth daily as needed for pain.      . Nutritional Supplements (ENSURE PO) Take 1 Can by mouth daily.      Marland Kitchen warfarin (COUMADIN) 5 MG  tablet Take 5 mg by mouth daily at 6 PM. 1 tablet (5 mg) on Monday, Wednesday, Friday, 1/2 tablet (2.5 mg) on all other days      . [DISCONTINUED] digoxin (LANOXIN) 0.25 MG tablet Take 1 tablet (250 mcg total) by mouth daily.  30 tablet  6  . [DISCONTINUED] enalapril (VASOTEC) 10 MG tablet Take 1 tablet (10 mg total) by mouth daily.  30 tablet  6  . [DISCONTINUED] metoprolol succinate (TOPROL-XL) 50 MG 24 hr tablet Take 50 mg by mouth 2 (two) times daily. Take with or immediately following a meal. 8am and 6pm - twice daily dose per pharmacy and confirmed with daughter        Assessment: 79 yo male admitted with dizziness/bradycardia , h/o Afib, to continue Coumadin.  INR therapeutic on home regimen, will continue.  Goal of Therapy:  INR 2-3   Plan:  Follow-up AM INR.  [employer], Pharm.D., BCPS Clinical Pharmacist Pager [phone]

## 2012-11-28 NOTE — Progress Notes (Signed)
Inpatient Diabetes Program Recommendations  AACE/ADA: New Consensus Statement on Inpatient Glycemic Control (2013)  Target Ranges:  Prepandial:   less than 140 mg/dL      Peak postprandial:   less than 180 mg/dL (1-2 hours)      Critically ill patients:  140 - 180 mg/dL  Results for CULLEN, LAHAIE (MRN [id number]) as of 11/28/2012 12:16  Ref. Range 11/27/2012 11:40 11/27/2012 16:36 11/27/2012 21:18 11/28/2012 07:29 11/28/2012 11:45  Glucose-Capillary Latest Range: 70-99 mg/dL 782 (H) 956 (H) 213 (H) 222 (H) 254 (H)    Inpatient Diabetes Program Recommendations Correction (SSI): start Novolog sensitive scale TID + HS scale per glycemic control order set CBGs are >200 Thank you  Piedad Climes BSN, RN,CDE Inpatient Diabetes Coordinator [phone] (team pager)

## 2012-11-28 NOTE — Progress Notes (Signed)
Utilization review completed.  

## 2012-11-29 ENCOUNTER — Encounter (HOSPITAL_COMMUNITY)
Admission: EM | Disposition: A | Discharge status: Home / Self Care | Payer: Self-pay | Source: Home / Self Care | Attending: Internal Medicine

## 2012-11-29 ENCOUNTER — Other Ambulatory Visit: Payer: Self-pay | Admitting: Cardiology

## 2012-11-29 DIAGNOSIS — I2510 Atherosclerotic heart disease of native coronary artery without angina pectoris: Secondary | ICD-10-CM

## 2012-11-29 DIAGNOSIS — I498 Other specified cardiac arrhythmias: Secondary | ICD-10-CM

## 2012-11-29 DIAGNOSIS — I4891 Unspecified atrial fibrillation: Principal | ICD-10-CM

## 2012-11-29 LAB — GLUCOSE, CAPILLARY
Glucose-Capillary: 216 mg/dL — ABNORMAL HIGH (ref 70–99)
Glucose-Capillary: 251 mg/dL — ABNORMAL HIGH (ref 70–99)

## 2012-11-29 LAB — PROTIME-INR: Prothrombin Time: 28.6 seconds — ABNORMAL HIGH (ref 11.6–15.2)

## 2012-11-29 SURGERY — PERMANENT PACEMAKER INSERTION
Anesthesia: Moderate Sedation | Laterality: Bilateral

## 2012-11-29 MED ORDER — DIAZEPAM 5 MG PO TABS: 5.0000 mg | ORAL_TABLET | ORAL | Status: DC

## 2012-11-29 MED ORDER — CEFAZOLIN SODIUM-DEXTROSE 2-3 GM-% IV SOLR
2.0000 g | INTRAVENOUS | Status: DC
  Filled 2012-11-29: qty 50

## 2012-11-29 MED ORDER — CHLORHEXIDINE GLUCONATE 4 % EX LIQD: 60.0000 mL | Freq: Once | CUTANEOUS | Status: DC

## 2012-11-29 MED ORDER — WARFARIN SODIUM 2.5 MG PO TABS
2.5000 mg | ORAL_TABLET | ORAL | Status: DC
  Administered 2012-11-29: 2.5 mg via ORAL
  Filled 2012-11-29: qty 1

## 2012-11-29 MED ORDER — METOPROLOL TARTRATE 12.5 MG HALF TABLET
12.5000 mg | ORAL_TABLET | Freq: Two times a day (BID) | ORAL | Status: DC
  Administered 2012-11-29 – 2012-11-30 (×3): 12.5 mg via ORAL
  Filled 2012-11-29 (×4): qty 1

## 2012-11-29 MED ORDER — SODIUM CHLORIDE 0.9 % IR SOLN
80.0000 mg | Status: DC
  Filled 2012-11-29: qty 2

## 2012-11-29 MED ORDER — SODIUM CHLORIDE 0.9 % IV SOLN: INTRAVENOUS | Status: DC

## 2012-11-29 MED ORDER — WARFARIN SODIUM 5 MG PO TABS
5.0000 mg | ORAL_TABLET | ORAL | Status: DC
  Administered 2012-11-30: 5 mg via ORAL
  Filled 2012-11-29: qty 1

## 2012-11-29 NOTE — Progress Notes (Signed)
Physical Therapy Treatment Patient Details Name: [name] MRN: [id number] DOB: [birth year] Today's Date: 11/29/2012 Time: 6578-4696 PT Time Calculation (min): 24 min  PT Assessment / Plan / Recommendation Comments on Treatment Session  Pt s/p vertigo making good progress with PT with ambulation.  Spoke with MD regarding d/c plan.  Pt should be able to go home tomorrow with daughter to be there 24 hours initially.  HHPT vestibular rehab needed as well as RW.      Follow Up Recommendations  Home health PT;Supervision/Assistance - 24 hour (HHPT for vestibular rehab and 24 hour asssit initially)                 Equipment Recommendations  Rolling walker with 5" wheels        Frequency Min 3X/week   Plan Frequency remains appropriate;Discharge plan needs to be updated    Precautions / Restrictions Precautions Precautions: Fall Restrictions Weight Bearing Restrictions: No   Pertinent Vitals/Pain VSS, No pain    Mobility  Bed Mobility Bed Mobility: Rolling Left;Left Sidelying to Sit;Sitting - Scoot to Edge of Bed;Sit to Sidelying Left Rolling Left: 5: Supervision Left Sidelying to Sit: 5: Supervision Sitting - Scoot to Edge of Bed: 5: Supervision Sit to Sidelying Left: 5: Supervision Details for Bed Mobility Assistance: Negative Modif Hallpike test to left.   Reviewed  x 1 exercises.  Pt needed constant cuing for exercises as he had trouble due to dizziness.   Transfers Transfers: Sit to Stand;Stand to Sit Sit to Stand: 4: Min assist;With upper extremity assist;From bed Stand to Sit: 4: Min assist;With upper extremity assist;To chair/3-in-1;With armrests Details for Transfer Assistance: Pt needed less steadying assist  Ambulation/Gait Ambulation/Gait Assistance: 4: Min assist Ambulation Distance (Feet): 230 Feet Assistive device: Rolling walker Ambulation/Gait Assistance Details: Pt ambulated with incr stability and no significant LOB in controlled envirronment with RW.   Needed less cues to stay close to RW as well.   Gait Pattern: Step-to pattern;Shuffle;Trunk flexed;Decreased step length - left;Decreased step length - right;Decreased stride length Gait velocity: decreased Stairs: No Wheelchair Mobility Wheelchair Mobility: No    Exercises Other Exercises Other Exercises: x1 exercises side to side 5 reps with pt able to perform up to 30 seconds. with dizzy 5/10   PT Goals Acute Rehab PT Goals Pt will go Sit to Stand: Independently PT Goal: Sit to Stand - Progress: Progressing toward goal Pt will Ambulate: 51 - 150 feet;with supervision;with least restrictive assistive device PT Goal: Ambulate - Progress: Progressing toward goal Pt will Perform Home Exercise Program: with supervision, verbal cues required/provided PT Goal: Perform Home Exercise Program - Progress: Progressing toward goal  Visit Information  Last PT Received On: 11/29/12 Assistance Needed: +1    Subjective Data  Subjective: "I feel less dizzy."   Cognition  Cognition Arousal/Alertness: Awake/alert Behavior During Therapy: WFL for tasks assessed/performed Overall Cognitive Status: Difficult to assess Difficult to assess due to: Non-English speaking    Balance  Static Standing Balance Static Standing - Balance Support: Bilateral upper extremity supported;During functional activity Static Standing - Level of Assistance: 5: Stand by assistance Static Standing - Comment/# of Minutes: 2 minutes with RW  End of Session PT - End of Session Equipment Utilized During Treatment: Gait belt Activity Tolerance: Patient tolerated treatment well Patient left: in chair;with call bell/phone within reach Nurse Communication: Mobility status       INGOLD,Derak Schurman 11/29/2012, 2:32 PM [facility] Acute Rehabilitation [phone] (pager)

## 2012-11-29 NOTE — Progress Notes (Signed)
[facility]  Subjective: Pt's daughter is by his bedside. She is interpreting for him. He states that he feels about the same as yesterday. Still feels fatigued.   Objective: Vital signs in last 24 hours: Temp:  [97.1 F (36.2 C)-98.6 F (37 C)] 97.1 F (36.2 C) (05/22 0500) Pulse Rate:  [58-68] 58 (05/22 0500) Resp:  [16-18] 16 (05/22 0500) BP: (137-161)/(53-64) 161/56 mmHg (05/22 0500) SpO2:  [97 %-100 %] 97 % (05/22 0500)    Intake/Output from previous day: 05/21 0701 - 05/22 0700 In: 1162.5 [P.O.:600; I.V.:562.5] Out: -  Intake/Output this shift:    Medications Current Facility-Administered Medications  Medication Dose Route Frequency Provider Last Rate Last Dose  . 0.9 %  sodium chloride infusion   Intravenous Continuous Renae Fickle, MD      . 0.9 %  sodium chloride infusion   Intravenous Continuous Abelino Derrick, PA-C      . [START ON 11/30/2012] ALPRAZolam Prudy Feeler) tablet 0.5 mg  0.5 mg Oral TID PRN Eda Paschal Kilroy, PA-C      . amLODipine (NORVASC) tablet 10 mg  10 mg Oral Daily Lars Mage, MD   10 mg at 11/28/12 0920  . aspirin EC tablet 81 mg  81 mg Oral Daily Lars Mage, MD   81 mg at 11/28/12 0920  . atorvastatin (LIPITOR) tablet 20 mg  20 mg Oral q1800 Lars Mage, MD   20 mg at 11/28/12 1722  . ceFAZolin (ANCEF) IVPB 2 g/50 mL premix  2 g Intravenous On Call Luke K Kilroy, PA-C      . chlorhexidine (HIBICLENS) 4 % liquid 4 application  60 mL Topical Once Luke K Kilroy, PA-C      . chlorhexidine (HIBICLENS) 4 % liquid 4 application  60 mL Topical Once Luke K Kilroy, PA-C      . diazepam (VALIUM) tablet 5 mg  5 mg Oral On Call Luke K Kilroy, PA-C      . docusate sodium (COLACE) capsule 100 mg  100 mg Oral BID Christiane Ha, MD   100 mg at 11/28/12 2208  . enalapril (VASOTEC) tablet 20 mg  20 mg Oral Daily Renae Fickle, MD   20 mg at 11/28/12 0919  . feeding supplement (ENSURE COMPLETE) liquid 237 mL  237 mL Oral TID BM Lars Mage, MD   237 mL at 11/28/12 2052  . gentamicin (GARAMYCIN) 80 mg in sodium chloride irrigation 0.9 % 500 mL irrigation  80 mg Irrigation On Call [employer], PA-C      . [START ON 11/30/2012] glipiZIDE (GLUCOTROL) tablet 2.5 mg  2.5 mg Oral QAC breakfast Luke K Kilroy, PA-C      . hydrALAZINE (APRESOLINE) tablet 10 mg  10 mg Oral Q8H Renae Fickle, MD   10 mg at 11/29/12 0531  . HYDROcodone-acetaminophen (NORCO/VICODIN) 5-325 MG per tablet 1 tablet  1 tablet Oral TID Christiane Ha, MD   1 tablet at 11/28/12 2209  . sertraline (ZOLOFT) tablet 25 mg  25 mg Oral Daily Christiane Ha, MD   25 mg at 11/28/12 0919  . sodium chloride 0.9 % injection 3 mL  3 mL Intravenous Q12H Lars Mage, MD   3 mL at 11/28/12 2209  . Warfarin - Pharmacist Dosing Inpatient   Does not apply q1800 Abelino Derrick, PA-C        PE: General appearance: alert, cooperative and no distress Lungs: clear to auscultation bilaterally Heart: irregularly irregular  rhythm, 2/6 SM Extremities: no LEE Pulses: 2+ radials. 0/1+ DPs Skin: warm and dry Neurologic: Grossly normal  Lab Results:   Recent Labs  11/26/12 1928 11/26/12 2133 11/27/12 0554  WBC  --  12.3* 6.5  HGB 13.3 12.8* 11.3*  HCT 39.0 37.0* 33.1*  PLT  --  188 159   BMET  Recent Labs  11/26/12 1928 11/26/12 2133 11/27/12 0554  NA 138 131* 136  K 4.8 4.6 4.5  CL 104 94* 103  CO2  --  25 22  GLUCOSE 210* 186* 164*  BUN 29* 26* 24*  CREATININE 1.40* 1.51* 1.33  CALCIUM  --  9.7 8.8   PT/INR  Recent Labs  11/27/12 0554 11/28/12 0515 11/29/12 0627  LABPROT 28.0* 29.4* 28.6*  INR 2.79* 2.98* 2.87*    Assessment/Plan  Principal Problem:   Bradycardia Active Problems:   DIABETES MELLITUS   HYPERTENSION, BENIGN   Permanent atrial fibrillation   PVD WITH CLAUDICATION- he was going to be set up for OP PVA   Unsteady gait   CAD, CABG X 4 2003. Myoview low risk 3/14   Ischemic colitis March 2014   Dizziness   Weakness    Depression   Dyslipidemia   Chronic anticoagulation   Acute renal insufficiency on admission- SCr 1.5   Chronic interstitial lung disease   LBBB (left bundle branch block)   RBBB  Plan: 79 y/o male with chronic AF, on BB and digoxin, admitted yesterday with symptomatic bradycardia, with HR in the 40s and with roughly 2 second pauses. EKG showed shift between RBBB and LBBB. Dr. Rennis Golden, felt him to be a candidate for PPM insertion. His HRs have normalized off BB and digoxin. Dr. Royann Shivers to see and to discuss PPM. Continue to hold Coumadin. INR this AM is 2.87. It is down from 2.98 yesterday. This may be too high to consider doing the procedure today. However, plan to keep NPO until seen by Dr. Royann Shivers, who will make final decision.     LOS: 3 days    Brittainy M. Delmer Islam 11/29/2012 8:43 AM  I have seen and examined the patient along with Brittainy M. Sharol Harness, PA-C.  I have reviewed the chart, notes and new data.  I agree with PA/NP's note.  Key new complaints: I have reviewed the usual sequence of events of Mr.Kishi symptoms, with his daughter [name] is serving as Equities trader. He confirms dizziness but this is brought on only when he turns his head from one side to the other. He has had prominent fatigue and it sounds like he may have even had a syncopal event before his admission. He had profound bradycardia due to atrial fibrillation with slow ventricular response on admission. Key examination changes:  his heart rate has increased considerably. Overnight, while asleep, his heart rate was in the 50s. After he woke up his heart rate increased to the 70-90 beats per minute range. I walked with him for a few steps in the hallway today and his heart rate appropriately increased to roughly 100 beats per minute. He remains in atrial fibrillation. He has been in permanent atrial fibrillation for several years.  Key new findings / data: I am confident that the electrocardiogram performed in  the emergency room on May 20 at 10:07 AM is not this patient's. All his other electrocardiograms, including one just performed a few minutes before and a few minutes after this tracing, show atrial fibrillation. All his other electrocardiograms show a right bundle branch block.  It is highly unlikely that he would have a transient episode of normal sinus rhythm with left bundle branch block.  PLAN:  I think the patient's symptomatic bradycardia was related to increased effective doses of digoxin and beta blockers. Until recently had been managing his own medications and according to his daughter would sometimes make a personal decision to skip a day of medications depending on his heart rate. Once his family to go for medication administration to have meticulously provided the prescribed doses.  He clearly has evidence of conduction system disease, but I do not think he has high-grade him for his him block. As recently as March of this year he was able to conduct at 130 beats per minute while in atrial fibrillation.  He may eventually need a permanent pacemaker, but I think we can safely delay that decision for now. In fact he is showing some signs of rapid ventricular rate and his blood pressure is a little high and I think we should resume beta blockers and a lower dose to avoid rebound.  Would restart metoprolol 12.5 mg twice a day and observe his heart rate over the next 24 hours. Digoxin will not be resumed. Note that on admission serum digoxin level was 1.9. Although this is not technically in the "toxic range", is a medication that is very avoided in a patient with rather volatile renal function.  He is walking quite briskly with the help of a walker and while in his room does not really need the walker. I doubt he will require inpatient rehabilitation.  Thurmon Fair, MD, [facility] [phone] 11/29/2012, 9:59 AM

## 2012-11-29 NOTE — Progress Notes (Signed)
Inpatient Diabetes Program Recommendations  AACE/ADA: New Consensus Statement on Inpatient Glycemic Control (2013)  Target Ranges:  Prepandial:   less than 140 mg/dL      Peak postprandial:   less than 180 mg/dL (1-2 hours)      Critically ill patients:  140 - 180 mg/dL   Hyperglycemia without insulin while in the hospital.  Inpatient Diabetes Program Recommendations Insulin - Basal: Pt would benefit from addition of lantus 10 units.  Glucose running in 200's around the clock Correction (SSI): start Novolog sensitive scale TID + HS scale per glycemic control order set  Thank you, Lenor Coffin, RN, CNS, Diabetes Coordinator [phone])

## 2012-11-29 NOTE — Progress Notes (Signed)
ANTICOAGULATION CONSULT NOTE - Initial Consult  Pharmacy Consult for Coumadin Indication: atrial fibrillation  Allergies  Allergen Reactions  . Percocet (Oxycodone-Acetaminophen) Other (See Comments)    "almost a heart attack"    Patient Measurements: Height: 5\' 5"  (165.1 cm) Weight: 151 lb 4.8 oz (68.629 kg) IBW/kg (Calculated) : 61.5  Vital Signs: Temp: 97.1 F (36.2 C) (05/22 0500) BP: 161/56 mmHg (05/22 0500) Pulse Rate: 58 (05/22 0500)  Labs:  Recent Labs  11/26/12 1928  11/26/12 2133 11/27/12 0554 11/28/12 0515 11/29/12 0627  HGB 13.3  --  12.8* 11.3*  --   --   HCT 39.0  --  37.0* 33.1*  --   --   PLT  --   --  188 159  --   --   APTT  --   --  46*  --   --   --   LABPROT  --   < > 25.8* 28.0* 29.4* 28.6*  INR  --   < > 2.50* 2.79* 2.98* 2.87*  CREATININE 1.40*  --  1.51* 1.33  --   --   < > = values in this interval not displayed.  Estimated Creatinine Clearance: 39.8 ml/min (by C-G formula based on Cr of 1.33).   Medical History: Past Medical History  Diagnosis Date  . Permanent atrial fibrillation   . Hypertension   . HNP (herniated nucleus pulposus), lumbar   . PVD (peripheral vascular disease)   . Diabetes mellitus   . CAD (coronary artery disease)     s/p CABG x 4  . Ventricular hypertrophy   . Ischemic colitis   . Gastroenteritis, noninfectious   . Rectal bleeding     presumed secondary to ischemic colitis  . Depression     Medications:  Prescriptions prior to admission  Medication Sig Dispense Refill  . ALPRAZolam (XANAX) 0.5 MG tablet Take 0.5 mg by mouth 2 (two) times daily as needed for anxiety.      Marland Kitchen amLODipine (NORVASC) 10 MG tablet Take 1 tablet (10 mg total) by mouth daily.  30 tablet  6  . aspirin EC 81 MG EC tablet Take 1 tablet (81 mg total) by mouth daily.  30 tablet  0  . atorvastatin (LIPITOR) 20 MG tablet Take 1 tablet (20 mg total) by mouth daily.  30 tablet  6  . HYDROcodone-acetaminophen (NORCO/VICODIN) 5-325 MG per  tablet Take 1 tablet by mouth daily as needed for pain.      . Nutritional Supplements (ENSURE PO) Take 1 Can by mouth daily.      Marland Kitchen warfarin (COUMADIN) 5 MG tablet Take 5 mg by mouth daily at 6 PM. 1 tablet (5 mg) on Monday, Wednesday, Friday, 1/2 tablet (2.5 mg) on all other days      . [DISCONTINUED] digoxin (LANOXIN) 0.25 MG tablet Take 1 tablet (250 mcg total) by mouth daily.  30 tablet  6  . [DISCONTINUED] enalapril (VASOTEC) 10 MG tablet Take 1 tablet (10 mg total) by mouth daily.  30 tablet  6  . [DISCONTINUED] metoprolol succinate (TOPROL-XL) 50 MG 24 hr tablet Take 50 mg by mouth 2 (two) times daily. Take with or immediately following a meal. 8am and 6pm - twice daily dose per pharmacy and confirmed with daughter        Assessment: 79 yo male admitted with dizziness/bradycardia , h/o Afib CVR on metoprolol.  Plan was to hold Cooumadin for pacer placement but now will schedule as an outpatient.  INR  2.87 is therapeutic on home regimen, will continue.  Goal of Therapy:  INR 2-3   Plan:  Coumadin 5mg  MWF / 2.5mg  TTSS  Leota Sauers Pharm.D. CPP, BCPS Clinical Pharmacist [phone] 11/29/2012 1:31 PM

## 2012-11-30 ENCOUNTER — Ambulatory Visit: Payer: Medicaid Other | Admitting: Pharmacist Clinician (PhC)/ Clinical Pharmacy Specialist

## 2012-11-30 ENCOUNTER — Ambulatory Visit: Payer: Medicaid Other | Admitting: Cardiology

## 2012-11-30 DIAGNOSIS — R269 Unspecified abnormalities of gait and mobility: Secondary | ICD-10-CM

## 2012-11-30 DIAGNOSIS — I447 Left bundle-branch block, unspecified: Secondary | ICD-10-CM

## 2012-11-30 DIAGNOSIS — R5381 Other malaise: Secondary | ICD-10-CM

## 2012-11-30 DIAGNOSIS — R42 Dizziness and giddiness: Secondary | ICD-10-CM

## 2012-11-30 LAB — GLUCOSE, CAPILLARY: Glucose-Capillary: 176 mg/dL — ABNORMAL HIGH (ref 70–99)

## 2012-11-30 LAB — PROTIME-INR: INR: 2.42 — ABNORMAL HIGH (ref 0.00–1.49)

## 2012-11-30 MED ORDER — TRAMADOL HCL 50 MG PO TABS: 50.0000 mg | ORAL_TABLET | Freq: Four times a day (QID) | ORAL | Status: DC | PRN

## 2012-11-30 MED ORDER — METOPROLOL TARTRATE 25 MG PO TABS: 12.5000 mg | ORAL_TABLET | Freq: Two times a day (BID) | ORAL | Status: DC

## 2012-11-30 MED ORDER — METFORMIN HCL 500 MG PO TABS: 500.0000 mg | ORAL_TABLET | Freq: Two times a day (BID) | ORAL | Status: DC

## 2012-11-30 MED ORDER — HYDRALAZINE HCL 10 MG PO TABS: 10.0000 mg | ORAL_TABLET | Freq: Three times a day (TID) | ORAL | Status: DC

## 2012-11-30 MED ORDER — METFORMIN HCL 500 MG PO TABS
500.0000 mg | ORAL_TABLET | Freq: Two times a day (BID) | ORAL | Status: DC
  Administered 2012-11-30: 500 mg via ORAL
  Filled 2012-11-30 (×2): qty 1

## 2012-11-30 NOTE — Progress Notes (Signed)
Pt discharged home; discharge instructions explained to pt's daughter and daughter was provided w/ a copy of discharge instructions; daughter verbalized understanding of discharge instructions.

## 2012-11-30 NOTE — Care Management Note (Addendum)
Page 1 of 2   11/30/2012     3:15:46 PM   CARE MANAGEMENT NOTE 11/30/2012  Patient:  [name], [name]   Account Number:  [id number]  Date Initiated:  11/30/2012  Documentation initiated by:  GRAVES-BIGELOW,Britini Garcilazo  Subjective/Objective Assessment:   Pt admitted with dizziness. PT recommends [facility] PT for vestibular therapy. However Medicaid will not pay for vestibular [facility] services.     Action/Plan:   CM did call the [facility] and Medicaid will pay for 1st initial evaluation and then 3 visits thereafter upon prior authorization. MD if you feel this is appropriate please write Rx for outpatient PT services eval and treat.   Anticipated DC Date:  11/30/2012   Anticipated DC Plan:  HOME/SELF CARE      DC Planning Services  CM consult      [facility] Choice  HOME HEALTH  DURABLE MEDICAL EQUIPMENT   Choice offered to / List presented to:  C-4 Adult Children   DME arranged  WALKER - [name]      DME agency  [facility]     [facility] arranged  HH-1 RN  HH-10 DISEASE MANAGEMENT  HH-2 PT  HH-6 SOCIAL WORKER      HH agency  [facility]   Status of service:  Completed, signed off Medicare Important Message given?   (If response is "NO", the following Medicare IM given date fields will be blank) Date Medicare IM given:   Date Additional Medicare IM given:    Discharge Disposition:  HOME W HOME HEALTH SERVICES  Per UR Regulation:  Reviewed for med. necessity/level of care/duration of stay  If discussed at Long Length of Stay Meetings, dates discussed:    Comments:  [address] Tom[address] Bamberger, RN,BSN [phone] CM called NP Nada Boozer and the plan of care is for home for disposition needs with vestibular therapy. CM did call [facility] and they will be able to take the case as a medicaid pt. CM did contact [facility] and they will help with PCS and get mediciad transportation set up. Cm did try to call the daughter and a vm was left to make her aware  of plan. Pt does not have the 3 day qualifying stay for SNF at this time and per MD is medically stable for d/c. Daughter will have to contact some friends of the family for assistance for coverage at home to provide 24 hr supervision. RW will be delivered to room before pt is d/c home. No further needs from CM at this time.   [address] Tomi Bamberger, [place] [phone] CM did call daughter and she works until 5:00pm. Unable to get her father to appointment d/t fact she works. Daughter did state that she would be willing to allow her father to go to Rehab for at least 2 weeks in order for him to get better. CM did relay this information to CSW. CSW Vernona Rieger will call some SNF's to see if this an option. CM did call [facility], however pt is too high functioning for CIR. CM will continue to monitor for disposition. CM also called [facility] to see if pt will be eligible for services.  CM did call [facility] to see if they are willing to take a medicaid pt for PT services. Pt will need transportation services set up if the plan is for home and then will need some PCS services via Medicaid. Pt will be followed by [facility] and they can assist with some  of these resources.

## 2012-11-30 NOTE — Progress Notes (Signed)
Physical Therapy Treatment Patient Details Name: [name] MRN: [id number] DOB: [birth year] Today's Date: 11/30/2012 Time: 0865-7846 PT Time Calculation (min): 26 min  PT Assessment / Plan / Recommendation Comments on Treatment Session  Pt s/p vertigo making good progress with PT with ambulation.  Used translator [phone] and translator stated that pt was feeling better with dizziness and reports it is only there every once and awhile.  Reviewed x1 exercises with  pt and left a handout in room.  Per MD yesterday, plan is for pt to go home and daughter will be there 3 days.  HHPT for vestibular rehab lined up.  Will get RW as well.      Follow Up Recommendations  Home health PT;Supervision/Assistance - 24 hour                 Equipment Recommendations  Rolling walker with 5" wheels        Frequency Min 3X/week   Plan Frequency remains appropriate;Discharge plan remains appropriate    Precautions / Restrictions Precautions Precautions: Fall Restrictions Weight Bearing Restrictions: No   Pertinent Vitals/Pain VSS, No pain    Mobility  Bed Mobility Bed Mobility: Rolling Left;Left Sidelying to Sit;Sitting - Scoot to Edge of Bed;Sit to Sidelying Left Rolling Left: 5: Supervision Left Sidelying to Sit: 5: Supervision Sitting - Scoot to Edge of Bed: 5: Supervision Sit to Sidelying Left: 5: Supervision Details for Bed Mobility Assistance:   Reviewed  x 1 exercises.  Pt needed constant cuing for exercises but is doing bettter everyday.  Gave pt handout.   Transfers Transfers: Sit to Stand;Stand to Sit Sit to Stand: With upper extremity assist;From bed;4: Min guard Stand to Sit: 4: Min guard;With upper extremity assist;To chair/3-in-1;With armrests Details for Transfer Assistance: Pt needed less steadying assist  Ambulation/Gait Ambulation/Gait Assistance: 4: Min guard Ambulation Distance (Feet): 250 Feet Assistive device: Rolling walker Ambulation/Gait Assistance Details: Pt  continues to incr stability with no significant LOB with RW.  Needed no cues for safety with ambulation in controlled environment.   Gait Pattern: Step-through pattern;Trunk flexed;Decreased stride length Gait velocity: decreased Stairs: No Wheelchair Mobility Wheelchair Mobility: No    Exercises Other Exercises Other Exercises: x1 exercises side to side 5 reps with pt able to perform up to 30 seconds. with dizzy 5/10   PT Goals Acute Rehab PT Goals Pt will go Sit to Stand: Independently PT Goal: Sit to Stand - Progress: Progressing toward goal Pt will Ambulate: 51 - 150 feet;with supervision;with least restrictive assistive device PT Goal: Ambulate - Progress: Progressing toward goal Pt will Perform Home Exercise Program: with supervision, verbal cues required/provided PT Goal: Perform Home Exercise Program - Progress: Progressing toward goal  Visit Information  Last PT Received On: 11/30/12 Assistance Needed: +1    Subjective Data  Subjective: "I feel less dizzy.  It happens less often."   Cognition  Cognition Arousal/Alertness: Awake/alert Behavior During Therapy: WFL for tasks assessed/performed Overall Cognitive Status: Difficult to assess Difficult to assess due to: Non-English speaking    Balance  Static Standing Balance Static Standing - Balance Support: Bilateral upper extremity supported;During functional activity Static Standing - Level of Assistance: 5: Stand by assistance Static Standing - Comment/# of Minutes: 3 minutes with RW  End of Session PT - End of Session Equipment Utilized During Treatment: Gait belt Activity Tolerance: Patient tolerated treatment well Patient left: in chair;with call bell/phone within reach Nurse Communication: Mobility status       INGOLD,Dorotea Hand 11/30/2012, 1:28 PM Lonzo Saulter  [place],PT Acute Rehabilitation [phone] (pager)

## 2012-11-30 NOTE — Progress Notes (Signed)
ANTICOAGULATION CONSULT NOTE - Follow Up Consult  Pharmacy Consult for Coumadin Indication: atrial fibrillation  Allergies  Allergen Reactions  . Percocet (Oxycodone-Acetaminophen) Other (See Comments)    "almost a heart attack"    Patient Measurements: Height: 5\' 5"  (165.1 cm) Weight: 151 lb 4.8 oz (68.629 kg) IBW/kg (Calculated) : 61.5 Heparin Dosing Weight:    Vital Signs: Temp: 98.1 F (36.7 C) (05/23 0500) BP: 151/65 mmHg (05/23 0500) Pulse Rate: 54 (05/23 0500)  Labs:  Recent Labs  11/28/12 0515 11/29/12 0627 11/30/12 0545  LABPROT 29.4* 28.6* 25.2*  INR 2.98* 2.87* 2.42*    Estimated Creatinine Clearance: 39.8 ml/min (by C-G formula based on Cr of 1.33).  Assessment: 79 yo M admitted 11/27/2012 with dizziness and bradycardia. Speaks no English.  Anticoagulation: Coumadin 5 mg MWF, 2.5 mg TTSS for Afib (admit INR 2.5); INR 2.42.  Cardiovascular: Afib, CAD s/p CABG, PVD; admitted with bradycardia HR in 40s now improved off digoxin. 151/64, HR 54 Meds: Norvasc 10mg , ASA 81mg , Lipitor, Enalapril, Hydralazine, metoprolol  Endocrinology: DM with CBG 176-251 on glipizide  Gastrointestinal / Nutrition: recent episode of ischemic colitis Neurology sertiline Nephrology: SCr 1.33 Pulmonary Hematology / Oncology: hgb down to 11.3  Goal of Therapy:  INR 2-3 Monitor platelets by anticoagulation protocol: Yes   Plan:  Continue Coumadin 5mg  MWF and 2.5mg  TTSS   Misty Stanley Stillinger 11/30/2012,9:48 AM

## 2012-11-30 NOTE — Discharge Summary (Signed)
Physician Discharge Summary   [facility]   Patient ID: [name] MRN: [id number] DOB/AGE: Nov 19, 1931 79 y.o.  Admit date: 11/26/2012 Discharge date: 11/30/2012  Discharge Diagnoses:  Principal Problem:   Bradycardia- resolved Active Problems:   DIABETES MELLITUS   HYPERTENSION, BENIGN   Permanent atrial fibrillation   PVD WITH CLAUDICATION- he was going to be set up for OP PVA   Unsteady gait   CAD, CABG X 4 2003. Myoview low risk 3/14   Diabetes mellitus type 2, uncontrolled   Ischemic colitis March 2014   Dizziness   Weakness   Depression   Dyslipidemia   Chronic anticoagulation   Acute renal insufficiency on admission- SCr 1.5   Chronic interstitial lung disease   RBBB   Discharged Condition: fair  Procedures: none  Hospital Course:  79 y.o. male from [place] with a past medical history significant for CAD s/p CABG X 4 in '04, CAF on Coumadin, PVD, DM, RBBB, dyslipidemia, and severe depression. He is followed by Dr Royann Shivers. He was recently evaluated in the hospital March of 2013 when he had acute cholecystis. He had a Myoview at that time that showed scar but no ischemia. Echo showed preserved LVF. He has severe PVD and claudication. Dr Royann Shivers just saw him 11/15/12 and he was to have an OP PVA with Dr Allyson Sabal.  He was admitted 11/27/12 with bradycardia from Urgent Care. He had presented there with complaints of 2 weeks of weakness, anorexia, and slow HR. His EKG showed his HR to 45. He had had pauses up to 2 seconds. His Lanoxin and Toprol were stopped on admission. He has a baseline RBBB and this AM converted to a LBBB. He has a history of depression and has somewhat of a flat affect. His daughter says he has not had any angina. His Troponin was negative. HR cam back up and his BB was restarted at a lower dose.  Initially concern for pacemaker was discussed with pt and daughter, but fortunately HR returned to normal.  It is believed that pt  is at times non compliant with meds and recently has been taking more routinely resulting in bradycardia.  The EKG with LBBB was felt to not be this pt's in that not only was there LBBB but also SR which is not reasonable considering he has chronic a fib and EKGs just before and just after were A fib.  Recent nuc study in March of this year without ischemia.   Additionally pt had stopped his diabetic meds earlier this month, we have restarted, his glucose in hospital has been mostly in mid to upper 200 range.  Pt has problems with chronic pain.  He is ambulating with rolling walker, otherwise he is off balance.  We have ordered home PT to assist, we ordered rolling walker and RN to help pt and family arrange meds to help prevent non compliance.  Dr. Herbie Baltimore discussed care with daughter prior to discharge.  HR was in 60-80s prior to discharge.  VS stable pt was stable and ready for discharge.   Consults: cardiology  Significant Diagnostic Studies:  BMET    Component Value Date/Time   NA 136 11/27/2012 0554   K 4.5 11/27/2012 0554   CL 103 11/27/2012 0554   CO2 22 11/27/2012 0554   GLUCOSE 164* 11/27/2012 0554   BUN 24* 11/27/2012 0554   CREATININE 1.33 11/27/2012 0554   CALCIUM 8.8 11/27/2012 0554   GFRNONAA 50* 11/27/2012 0554  GFRAA 57* 11/27/2012 0554    CBC    Component Value Date/Time   WBC 6.5 11/27/2012 0554   RBC 3.84* 11/27/2012 0554   HGB 11.3* 11/27/2012 0554   HCT 33.1* 11/27/2012 0554   PLT 159 11/27/2012 0554   MCV 86.2 11/27/2012 0554   MCH 29.4 11/27/2012 0554   MCHC 34.1 11/27/2012 0554   RDW 14.1 11/27/2012 0554   LYMPHSABS 1.6 11/27/2012 0554   MONOABS 0.7 11/27/2012 0554   EOSABS 0.1 11/27/2012 0554   BASOSABS 0.1 11/27/2012 0554     INR 2.42 Dig level 1.9 Troponin negative.  Discharge Exam: Blood pressure 165/68, pulse 85, temperature 98.1 F (36.7 C), temperature source Oral, resp. rate 18, height 5\' 5"  (1.651 m), weight 151 lb 4.8 oz (68.629 kg), SpO2 97.00%.  AM  exam:   PE: General:Pleasant affect, NAD  Heart:irreg irreg, soft 1-2/6 SEM @ RUSB, gallop, rub or click  Lungs:clear without rales, rhonchi, or wheezes  WUJ:WJXB, non tender, + BS, do not palpate liver spleen or masses  Ext:no lower ext edema, 2+ pedal pulses, 2+ radial pulses  Disposition: Home       Future Appointments Provider Department Dept Phone   12/07/2012 8:20 AM Phillips Hay, RPH-CPP [facility] [place] [phone]   12/13/2012 9:15 AM Thurmon Fair, MD [facility] [place] [phone]       Medication List    STOP taking these medications       digoxin 0.25 MG tablet  Commonly known as:  LANOXIN     metoprolol succinate 50 MG 24 hr tablet  Commonly known as:  TOPROL-XL      TAKE these medications       ALPRAZolam 0.5 MG tablet  Commonly known as:  XANAX  Take 0.5 mg by mouth 2 (two) times daily as needed for anxiety.     amLODipine 10 MG tablet  Commonly known as:  NORVASC  Take 1 tablet (10 mg total) by mouth daily.     aspirin 81 MG EC tablet  Take 1 tablet (81 mg total) by mouth daily.     atorvastatin 20 MG tablet  Commonly known as:  LIPITOR  Take 1 tablet (20 mg total) by mouth daily.     enalapril 20 MG tablet  Commonly known as:  VASOTEC  Take 1 tablet (20 mg total) by mouth daily.     ENSURE PO  Take 1 Can by mouth daily.     glipiZIDE 2.5 mg Tabs  Commonly known as:  GLUCOTROL  Take 0.5 tablets (2.5 mg total) by mouth daily before breakfast.     hydrALAZINE 10 MG tablet  Commonly known as:  APRESOLINE  Take 1 tablet (10 mg total) by mouth every 8 (eight) hours.     HYDROcodone-acetaminophen 5-325 MG per tablet  Commonly known as:  NORCO/VICODIN  Take 1 tablet by mouth daily as needed for pain.     metFORMIN 500 MG tablet  Commonly known as:  GLUCOPHAGE  Take 1 tablet (500 mg total) by mouth 2 (two) times daily with a meal.     metoprolol tartrate 25 MG tablet   Commonly known as:  LOPRESSOR  Take 0.5 tablets (12.5 mg total) by mouth 2 (two) times daily.     sertraline 25 MG tablet  Commonly known as:  ZOLOFT  Take 1 tablet (25 mg total) by mouth daily.     traMADol 50 MG tablet  Commonly known as:  Janean Sark  Take 1 tablet (50 mg total) by mouth every 6 (six) hours as needed for pain (take during the day).     warfarin 5 MG tablet  Commonly known as:  COUMADIN  Take 5 mg by mouth daily at 6 PM. 1 tablet (5 mg) on Monday, Wednesday, Friday, 1/2 tablet (2.5 mg) on all other days       Follow-up Information   Follow up with Paulino Rily, MD. Schedule an appointment as soon as possible for a visit on 12/13/2012. (at 9:15 am)    Contact information:   [address] [place] [address] [phone]       Follow up with Thurmon Fair, MD. Schedule an appointment as soon as possible for a visit on 12/13/2012. (at 9:15 am)    Contact information:   [address] [place] [address] [phone]       Follow up with [facility] On 12/07/2012. (8:20 am)     Discharge Instructions: Home Health Services to be provided by [facility] [phone] Registered Nurse Physical Therapy for Vestibular Therapy Social Worker  Partnership for [facility] will f/u for additional Resources.   Heart Healthy diabetic diet  Call our office if any weakness or SOB and we will have you come in sooner to see a PA or NP.  Signed: Leone Brand Nurse Practitioner-Certified [facility] 11/30/2012, 5:12 PM  Time spent on discharge :Including MD time 45 minutes.    I saw the patient on the day of discharge and met with his daughter.  HR seemed to be stable in 50s & 60s o/n with low dose BB over night.   Discussed d/c plans with Dr. Royann Shivers --  Then followed up with pt's daughter [name].   Plan is d/c on Metoprolol 12.5 bid.  Order written for Home Health PT -  Vestibular Rehab  Also, Dr. Royann Shivers intended for the pt's Dtr to call [facility] [phone]) to discuss scheduling a Peripheral Angiogram with Dr. Allyson Sabal (would need to talk with Dr. Hazle Coca RN & Scheduling)  Total NP/PA & MD time for f/u visit & d/c planning - 45 min.  Marykay Lex, M.D., M.S. [facility] [address] [place], [place]  [address]  [phone] Pager # [phone] 11/30/2012 9:10 PM

## 2012-11-30 NOTE — Progress Notes (Addendum)
[facility]   Subjective: Family not yet present, I will have translator either family or by phone when MD arrives  Objective: Vital signs in last 24 hours: Temp:  [98.1 F (36.7 C)] 98.1 F (36.7 C) (05/23 0500) Pulse Rate:  [54-71] 54 (05/23 0500) Resp:  [17-18] 18 (05/23 0500) BP: (151-161)/(46-65) 151/65 mmHg (05/23 0500) SpO2:  [97 %-98 %] 98 % (05/23 0500) Weight change:     PE: General:Pleasant affect, NAD Heart:irreg irreg, soft 1-2/6 SEM @ RUSB, gallop, rub or click Lungs:clear without rales, rhonchi, or wheezes YNW:GNFA, non tender, + BS, do not palpate liver spleen or masses Ext:no lower ext edema, 2+ pedal pulses, 2+ radial pulses   Lab Results  Component Value Date   CHOL 145 09/25/2012   HDL 39* 09/25/2012   LDLCALC 89 09/25/2012   TRIG 86 09/25/2012   CHOLHDL 3.7 09/25/2012   Lab Results  Component Value Date   HGBA1C 9.0* 09/25/2012     Lab Results  Component Value Date   TSH 3.766 09/25/2012      Studies/Results: 2D Echo: 09/25/12:  - Left ventricle: The cavity size was normal. Wall thickness was increased in a pattern of mild LVH. Systolic function was normal. The estimated ejection fraction was in the range of 50% to 55%. Wall motion was normal; there were no regional wall motion abnormalities. - Left atrium: The atrium was moderately to severely dilated. - Right atrium: The atrium was moderately dilated. - Tricuspid valve: Moderate regurgitation. - Pulmonary arteries: PA peak pressure: 43mm Hg (S).   Medications: I have reviewed the patient's current medications. Scheduled Meds: . amLODipine  10 mg Oral Daily  . aspirin EC  81 mg Oral Daily  . atorvastatin  20 mg Oral q1800  . docusate sodium  100 mg Oral BID  . enalapril  20 mg Oral Daily  . feeding supplement  237 mL Oral TID BM  . glipiZIDE  2.5 mg Oral QAC breakfast  . hydrALAZINE  10 mg Oral Q8H  . HYDROcodone-acetaminophen  1 tablet Oral TID  . metoprolol  tartrate  12.5 mg Oral BID  . sertraline  25 mg Oral Daily  . sodium chloride  3 mL Intravenous Q12H  . warfarin  2.5 mg Oral Q T,Th,S,Su-1800  . warfarin  5 mg Oral Q M,W,F-1800  . Warfarin - Pharmacist Dosing Inpatient   Does not apply q1800   Continuous Infusions: . sodium chloride Stopped (11/28/12 1330)   PRN Meds:.ALPRAZolam  Assessment/Plan: Principal Problem:   Bradycardia Active Problems:   DIABETES MELLITUS   HYPERTENSION, BENIGN   Permanent atrial fibrillation   PVD WITH CLAUDICATION- he was going to be set up for OP PVA   Unsteady gait   CAD, CABG X 4 2003. Myoview low risk 3/14   Ischemic colitis March 2014   Dizziness   Weakness   Depression   Dyslipidemia   Chronic anticoagulation   Acute renal insufficiency on admission- SCr 1.5   Chronic interstitial lung disease   LBBB (left bundle branch block)   RBBB  PLAN: Per Dr. Royann Shivers- hold on PPM for now unless on low dose of BB he develops further bradycardia.  Stopping Dig. Completely.   INR stable at 2.42 Low risk nuc study on 09/26/12  Improved HR now in 60's no low rate alarms noted on tele. Has rec'd 2 doses of the 12.5 of lopressor. ? Home with cardionet?  LOS: 4 days   Time spent with  pt. :  10  minutes. [facility] R  Nurse Practitioner Certified Pager [phone] 11/30/2012, 7:54 AM  I have seen and evaluated the patient this AM along with Nada Boozer, NP. I agree with her findings, examination as well as impression recommendations.  HR seemed to be stable in 50s & 60s o/n with low dose BB.  He should be ready for d/c -- will d/w Dr. Royann Shivers as to +/- OP Monitor to assess for significance of TachyBrady.  His exam is essentially benign.  Unfortunately there is no communication besides hand signals.   We are awaiting his daughter's arrival to help with translation.    Time with pt: 5 min -- will spend more talking with daughter upon arrival.  Marykay Lex, M.D., M.S. [facility] [address] [place], [place]  [address]  [phone] Pager # [phone] 11/30/2012 11:05 AM  ADDENDUM: Discussed d/c plans with Dr. Royann Shivers -- after phone call with pt's daughter [name].   Plan is d/c on Metoprolol 12.5 bid.  Order written for Home Health PT - Vestibular Rehab (not inpt rehab, as he would not be a good candidate with the language barrier).  Also, Dr. Royann Shivers intended for the pt's Dtr to call [facility] [phone]) to discuss scheduling a Peripheral Angiogram with Dr. Allyson Sabal (would need to talk with Dr. Hazle Coca RN & Scheduling).  Total NP/PA & MD time for f/u visit & d/c planning - 45 min.  Marykay Lex, MD

## 2012-12-02 ENCOUNTER — Telehealth: Payer: Self-pay | Admitting: Cardiology

## 2012-12-02 DIAGNOSIS — M6281 Muscle weakness (generalized): Secondary | ICD-10-CM | POA: Insufficient documentation

## 2012-12-02 DIAGNOSIS — Z7901 Long term (current) use of anticoagulants: Secondary | ICD-10-CM | POA: Insufficient documentation

## 2012-12-02 DIAGNOSIS — Z79899 Other long term (current) drug therapy: Secondary | ICD-10-CM | POA: Insufficient documentation

## 2012-12-02 DIAGNOSIS — Z885 Allergy status to narcotic agent status: Secondary | ICD-10-CM | POA: Insufficient documentation

## 2012-12-02 DIAGNOSIS — I10 Essential (primary) hypertension: Secondary | ICD-10-CM | POA: Insufficient documentation

## 2012-12-02 DIAGNOSIS — Z7982 Long term (current) use of aspirin: Secondary | ICD-10-CM | POA: Insufficient documentation

## 2012-12-02 DIAGNOSIS — R209 Unspecified disturbances of skin sensation: Secondary | ICD-10-CM | POA: Insufficient documentation

## 2012-12-02 DIAGNOSIS — E119 Type 2 diabetes mellitus without complications: Secondary | ICD-10-CM | POA: Insufficient documentation

## 2012-12-02 DIAGNOSIS — F329 Major depressive disorder, single episode, unspecified: Secondary | ICD-10-CM | POA: Insufficient documentation

## 2012-12-02 DIAGNOSIS — I517 Cardiomegaly: Secondary | ICD-10-CM | POA: Insufficient documentation

## 2012-12-02 DIAGNOSIS — Z8739 Personal history of other diseases of the musculoskeletal system and connective tissue: Secondary | ICD-10-CM | POA: Insufficient documentation

## 2012-12-02 DIAGNOSIS — R110 Nausea: Secondary | ICD-10-CM | POA: Insufficient documentation

## 2012-12-02 DIAGNOSIS — R05 Cough: Secondary | ICD-10-CM | POA: Insufficient documentation

## 2012-12-02 DIAGNOSIS — I739 Peripheral vascular disease, unspecified: Secondary | ICD-10-CM | POA: Insufficient documentation

## 2012-12-02 DIAGNOSIS — I4891 Unspecified atrial fibrillation: Secondary | ICD-10-CM | POA: Insufficient documentation

## 2012-12-02 DIAGNOSIS — R630 Anorexia: Secondary | ICD-10-CM | POA: Insufficient documentation

## 2012-12-02 DIAGNOSIS — I2510 Atherosclerotic heart disease of native coronary artery without angina pectoris: Secondary | ICD-10-CM | POA: Insufficient documentation

## 2012-12-02 DIAGNOSIS — Z8719 Personal history of other diseases of the digestive system: Secondary | ICD-10-CM | POA: Insufficient documentation

## 2012-12-02 DIAGNOSIS — R059 Cough, unspecified: Secondary | ICD-10-CM | POA: Insufficient documentation

## 2012-12-02 DIAGNOSIS — F3289 Other specified depressive episodes: Secondary | ICD-10-CM | POA: Insufficient documentation

## 2012-12-02 DIAGNOSIS — Z87891 Personal history of nicotine dependence: Secondary | ICD-10-CM | POA: Insufficient documentation

## 2012-12-02 DIAGNOSIS — R51 Headache: Secondary | ICD-10-CM | POA: Insufficient documentation

## 2012-12-02 LAB — BASIC METABOLIC PANEL
BUN: 31 mg/dL — ABNORMAL HIGH (ref 6–23)
Chloride: 97 mEq/L (ref 96–112)
GFR calc Af Amer: 54 mL/min — ABNORMAL LOW (ref >90)
Glucose, Bld: 183 mg/dL — ABNORMAL HIGH (ref 70–99)
Potassium: 4.8 mEq/L (ref 3.5–5.1)
Sodium: 133 mEq/L — ABNORMAL LOW (ref 135–145)

## 2012-12-02 LAB — CBC
HCT: 36.3 % — ABNORMAL LOW (ref 39.0–52.0)
Hemoglobin: 12.4 g/dL — ABNORMAL LOW (ref 13.0–17.0)
WBC: 8.7 10*3/uL (ref 4.0–10.5)

## 2012-12-02 LAB — POCT I-STAT TROPONIN I

## 2012-12-02 NOTE — ED Notes (Signed)
He was in hospital Friday, for pacemaker insertion. However the insertion did not happen

## 2012-12-02 NOTE — ED Notes (Signed)
NURSE FIRST ROUNDS: NURSE EXPLAINED DELAY TO PT./FAMILY , DENIES PAIN /RESPIRATIONS UNLABORED . VITAL SIGNS STABLE .

## 2012-12-02 NOTE — ED Notes (Signed)
Pt does not english, he speaks [place]. Has had to stop taking BP medication because at home he was experiencing hypotension this morning. Now he is feeling dizzy, and anxiety.

## 2012-12-02 NOTE — Telephone Encounter (Signed)
The patient called back.  She did not want come to bring him to the ER.  His blood pressure is higher than it was earlier.  She was calling back to ask about meds.  She will not give him his hydralazine.  He is not due for enalapril tonight.  She will give the 12.5 of Lopressor.  She is told to come to the ER if he gets worse although she reports that now he is much better.  She will call in the AM to report his BPs and discuss with his MDs further med changes.

## 2012-12-02 NOTE — Telephone Encounter (Signed)
I answered page from Pt's daughter, who is concerned about her father's BP.  BP this AM was ok but after medications his BP continues to fall.  He was discharged this past Friday.  On initial call his BP was 96 systolic with pulse in the 60's.  I have checked pt's chart  And called his daughter back and now his BP is 40-35 systolic.  He was still talking with her.  She is very distressed.  I asked her to call 911 to further evaluate her father.  She tells me that we added 4 meds, but according to records we did increase his enalapril from 10 to 20 mg, decreased his BB from 50 BID of Toprol to 12.5 BID of Lopressor, he has permeant atrial fib , stopped his dig. And we did add hydralazine TID 10 mg every 8 hours.  His BP in the hospital was 150 to 170 systolic.We also added glipizide and Metformin back for his diabetes.  Pt's daughter called back again while I was typing this up to say BP 111 systolic.  His glucose earlier was 240.  She was concerned about the cost of ambulance.  I suggested she just watch him at this point unless he had other problems or since his BP stable to take to ER for eval.  He will hold the hydralazine for now and hold the metoprolol for BP < 100.  She will call me after 8 am tomorrow to let me know his BP , and we will decide what meds to take.  I am unsure BP cuff is accurate since he is in Atrial fib.  Pulse per his machine is in the 60s.  The pt himself does not speak [place].

## 2012-12-03 ENCOUNTER — Encounter (HOSPITAL_COMMUNITY): Payer: Self-pay | Admitting: Physical Medicine and Rehabilitation

## 2012-12-03 ENCOUNTER — Emergency Department (HOSPITAL_COMMUNITY): Payer: Medicaid Other

## 2012-12-03 ENCOUNTER — Emergency Department (HOSPITAL_COMMUNITY)
Admission: EM | Admit: 2012-12-03 | Discharge: 2012-12-03 | Disposition: A | Discharge status: Home / Self Care | Payer: Medicaid Other | Attending: Emergency Medicine | Admitting: Emergency Medicine

## 2012-12-03 ENCOUNTER — Telehealth: Payer: Self-pay | Admitting: Cardiology

## 2012-12-03 DIAGNOSIS — R51 Headache: Secondary | ICD-10-CM

## 2012-12-03 LAB — APTT: aPTT: 35 seconds (ref 24–37)

## 2012-12-03 MED ORDER — MORPHINE SULFATE 4 MG/ML IJ SOLN
4.0000 mg | Freq: Once | INTRAMUSCULAR | Status: AC
  Administered 2012-12-03: 4 mg via INTRAVENOUS
  Filled 2012-12-03: qty 1

## 2012-12-03 MED ORDER — ONDANSETRON HCL 4 MG/2ML IJ SOLN
4.0000 mg | Freq: Once | INTRAMUSCULAR | Status: AC
  Administered 2012-12-03: 4 mg via INTRAVENOUS
  Filled 2012-12-03: qty 2

## 2012-12-03 MED ORDER — LORAZEPAM 2 MG/ML IJ SOLN
1.0000 mg | Freq: Once | INTRAMUSCULAR | Status: AC
  Administered 2012-12-03: 1 mg via INTRAVENOUS
  Filled 2012-12-03: qty 1

## 2012-12-03 NOTE — ED Notes (Signed)
[phone]

## 2012-12-03 NOTE — ED Notes (Signed)
Pt having increased anxiety over wait time to see MD. Delay explained to PT and daughter. Daughter concerned over BP of 125/95, stating "that just isn't a good one." Explained normal values of BP and MAP.

## 2012-12-03 NOTE — ED Notes (Signed)
Daughter at bedside reporting that pt has been hypotensive since this AM. Pt recently started on 4 different HTN medications, and today he was very weak and dizzy appx an hour after taking RX (appx 11 AM yesterday). Pt denies dizziness/blurred vision at this time. Daughter talked to cardiologist and PCP about syx and was recommended to come here.

## 2012-12-03 NOTE — ED Provider Notes (Signed)
History     CSN: [id number]  Arrival date & time 12/02/12  2134   First MD Initiated Contact with Patient 12/03/12 0209      Chief Complaint  Patient presents with  . Dizziness    (Consider location/radiation/quality/duration/timing/severity/associated sxs/prior treatment) HPI  History obtained while daughter is translating. Of note daughter is resentful she is being asked questions, she states she's already answered questions.  Patient has been having a headache all day that he describes as diffuse and can only characterize as a "pain". He has had nausea without vomiting. She states he is weak in his arms and legs and has numbness in his worse on the left than the right. However she states she did not stumble when ambulated into the car to come to the emergency department. He describes dizziness which is a feeling he is going to pass out but got worse today. He denies chest pain but has had a dry cough without fever. He denies sore throat. He is complaining of a dry mouth and decreased appetite. He denies rectal bleeding or melena. Daughter also states today his blood pressure was low and she shows me a blood pressure cuff with a mammary that shows his blood pressure was 57/52, 62/48, and then this evening when he was anxious it was 190/61. Daughter is upset because the pulse ox is stating his pulse rate is in the 30's but when I looked at the monitor his HR was in the 70-80's. She is also upset because he is anxious and in pain.   She relates she was admitted recently for bradycardia and there was discussion of a pacemaker however they changed his medications and they were going to reassess him in a couple weeks.  PCP [facility] Dr Royann Shivers  Past Medical History  Diagnosis Date  . Permanent atrial fibrillation   . Hypertension   . HNP (herniated nucleus pulposus), lumbar   . PVD (peripheral vascular disease)   . Diabetes mellitus   . CAD (coronary artery  disease)     s/p CABG x 4  . Ventricular hypertrophy   . Ischemic colitis   . Gastroenteritis, noninfectious   . Rectal bleeding     presumed secondary to ischemic colitis  . Depression   anxiety disorder  Past Surgical History  Procedure Laterality Date  . Cardiac catheterization  01/2002    40-50% distal LM/ostial LAD disease, 50-60% ostial LAD, 60% mid LAD, 30-40% D1, 70% tubular mid LCx, 70% ostial disease in two ramus intermedius branches, 70% prox RCA, 50-60% mid RCA, 70% distal RCA, LVEF 40%   . Coronary artery bypass graft  01/2002    x 4: LIMA-LAD, SVG-OM, SVG-PDA-PLB  . Cholecystectomy N/A 09/27/2012    Procedure: LAPAROSCOPIC CHOLECYSTECTOMY WITH INTRAOPERATIVE CHOLANGIOGRAM;  Surgeon: Lodema Pilot, DO;  Location: [facility] OR;  Service: General;  Laterality: N/A;    Family History  Problem Relation Age of Onset  . Heart disease Mother   . Heart disease Sister   . Breast cancer Sister     History  Substance Use Topics  . Smoking status: Former Smoker -- 1.00 packs/day for 30 years    Types: Cigarettes    Quit date: 02/15/1991  . Smokeless tobacco: Not on file  . Alcohol Use: No   Lives with daughter   Review of Systems  All other systems reviewed and are negative.    Allergies  Percocet  Home Medications   Current Outpatient Rx  Name  Route  Sig  Dispense  Refill  . ALPRAZolam (XANAX) 0.5 MG tablet   Oral   Take 0.5 mg by mouth 2 (two) times daily as needed for anxiety.         Marland Kitchen amLODipine (NORVASC) 10 MG tablet   Oral   Take 1 tablet (10 mg total) by mouth daily.   30 tablet   6   . aspirin EC 81 MG EC tablet   Oral   Take 1 tablet (81 mg total) by mouth daily.   30 tablet   0   . atorvastatin (LIPITOR) 20 MG tablet   Oral   Take 1 tablet (20 mg total) by mouth daily.   30 tablet   6   . enalapril (VASOTEC) 20 MG tablet   Oral   Take 1 tablet (20 mg total) by mouth daily.   30 tablet   6   . glipiZIDE (GLUCOTROL) 2.5 mg TABS    Oral   Take 0.5 tablets (2.5 mg total) by mouth daily before breakfast.   30 tablet   0   . hydrALAZINE (APRESOLINE) 10 MG tablet   Oral   Take 1 tablet (10 mg total) by mouth every 8 (eight) hours.   90 tablet   6   . HYDROcodone-acetaminophen (NORCO/VICODIN) 5-325 MG per tablet   Oral   Take 1 tablet by mouth daily as needed for pain.         . metFORMIN (GLUCOPHAGE) 500 MG tablet   Oral   Take 1 tablet (500 mg total) by mouth 2 (two) times daily with a meal.   60 tablet   6   . metoprolol tartrate (LOPRESSOR) 25 MG tablet   Oral   Take 0.5 tablets (12.5 mg total) by mouth 2 (two) times daily.   30 tablet   6   . Nutritional Supplements (ENSURE PO)   Oral   Take 1 Can by mouth 2 (two) times daily.          . sertraline (ZOLOFT) 25 MG tablet   Oral   Take 1 tablet (25 mg total) by mouth daily.   30 tablet   0   . traMADol (ULTRAM) 50 MG tablet   Oral   Take 1 tablet (50 mg total) by mouth every 6 (six) hours as needed for pain (take during the day).   30 tablet   1   . warfarin (COUMADIN) 5 MG tablet   Oral   Take 2.5-5 mg by mouth See admin instructions. 1 tablet (5 mg) on Monday, Wednesday, Friday, 1/2 tablet (2.5 mg) on all other days           BP 125/49  Pulse 75  Temp(Src) 98.4 F (36.9 C) (Oral)  Resp 14  SpO2 99%  Vital signs normal    Physical Exam  Nursing note and vitals reviewed. Constitutional: He is oriented to person, place, and time. He appears well-developed and well-nourished.  Non-toxic appearance. He does not appear ill. He appears distressed.  Pt is moaning during the whole interview  HENT:  Head: Normocephalic and atraumatic.  Right Ear: External ear normal.  Left Ear: External ear normal.  Nose: Nose normal. No mucosal edema or rhinorrhea.  Mouth/Throat: Oropharynx is clear and moist and mucous membranes are normal. No dental abscesses or edematous.  Eyes: Conjunctivae and EOM are normal. Pupils are equal, round, and  reactive to light.  Neck: Normal range of motion and full passive range of motion  without pain. Neck supple.  Cardiovascular: Normal rate, regular rhythm and normal heart sounds.  Exam reveals no gallop and no friction rub.   No murmur heard. Pulmonary/Chest: Effort normal and breath sounds normal. No respiratory distress. He has no wheezes. He has no rhonchi. He has no rales. He exhibits no tenderness and no crepitus.  Abdominal: Soft. Normal appearance and bowel sounds are normal. He exhibits no distension. There is no tenderness. There is no rebound and no guarding.  Musculoskeletal: Normal range of motion. He exhibits no edema and no tenderness.  Moves all extremities well. No grip weakness, no pronator drift  Neurological: He is alert and oriented to person, place, and time. He has normal strength. No cranial nerve deficit.  Skin: Skin is warm, dry and intact. No rash noted. No erythema. There is pallor.  Psychiatric: He has a normal mood and affect. His speech is normal and behavior is normal. His mood appears not anxious.    ED Course  Procedures (including critical care time) Medications  morphine 4 MG/ML injection 4 mg (4 mg Intravenous Given 12/03/12 0347)  ondansetron (ZOFRAN) injection 4 mg (4 mg Intravenous Given 12/03/12 0345)  LORazepam (ATIVAN) injection 1 mg (1 mg Intravenous Given 12/03/12 0344)   Pt placed on cardiac monitor, 06:00 daughter states his HR has been dropping down to 35 however the monitor does not show that has happened. Daughter is very unhappy that patient has had pain medication and anxiety medication "2 hours ago" and he is sleeping, although earlier she was unhappy because he hadn't had anything for his pain. Pt is now not moaning out loud and is closing his eyes and resting. She states he won't tell her if his headache is better or not. Daughter given option to take him home now, or go home to her daughter that she left home alone and come back later to pick up  her father, she doesn't want either option. She has talked to the charge nurse and is willing to take him home. We are going to see if he is able to get up and walk so he can go home. If not he can stay in the ED a while longer until he is more awake and she can come back and get him to take him home. Pt also offered to have him taken home by ambulance which she has refused.   Pt was able to ambulate in the ED prior to being discharged, he no longer appears to be in the pain he was in when he was first seen.   Results for orders placed during the hospital encounter of 12/03/12  CBC      Result Value Range   WBC 8.7  4.0 - 10.5 K/uL   RBC 4.16 (*) 4.22 - 5.81 MIL/uL   Hemoglobin 12.4 (*) 13.0 - 17.0 g/dL   HCT 16.1 (*) 09.6 - 04.5 %   MCV 87.3  78.0 - 100.0 fL   MCH 29.8  26.0 - 34.0 pg   MCHC 34.2  30.0 - 36.0 g/dL   RDW 40.9  81.1 - 91.4 %   Platelets 211  150 - 400 K/uL  BASIC METABOLIC PANEL      Result Value Range   Sodium 133 (*) 135 - 145 mEq/L   Potassium 4.8  3.5 - 5.1 mEq/L   Chloride 97  96 - 112 mEq/L   CO2 26  19 - 32 mEq/L   Glucose, Bld 183 (*) 70 - 99  mg/dL   BUN 31 (*) 6 - 23 mg/dL   Creatinine, Ser 1.91 (*) 0.50 - 1.35 mg/dL   Calcium 9.7  8.4 - 47.8 mg/dL   GFR calc non Af Amer 46 (*) >90 mL/min   GFR calc Af Amer 54 (*) >90 mL/min  APTT      Result Value Range   aPTT 35  24 - 37 seconds  PROTIME-INR      Result Value Range   Prothrombin Time 18.3 (*) 11.6 - 15.2 seconds   INR 1.57 (*) 0.00 - 1.49  POCT I-STAT TROPONIN I      Result Value Range   Troponin i, poc 0.02  0.00 - 0.08 ng/mL   Comment 3            Laboratory interpretation all normal except mild anemia, mild hyponatremia, hyperglycemia, renal insufficiency  Ct Head Wo Contrast  12/03/2012   *RADIOLOGY REPORT*  Clinical Data: Vertigo; lethargy.  Altered mental status.  Patient on Coumadin.  CT HEAD WITHOUT CONTRAST  Technique:  Contiguous axial images were obtained from the base of the skull  through the vertex without contrast.  Comparison: CT of the head performed 09/24/2012  Findings: There is no evidence of acute infarction, mass lesion, or intra- or extra-axial hemorrhage on CT.  Prominence of the ventricles and sulci reflects moderate cortical volume loss.  Scattered periventricular and subcortical white matter change likely reflects small vessel ischemic microangiopathy.  A chronic lacunar infarct is noted in the right putamen.  Cerebellar atrophy is noted.  The brainstem and fourth ventricle are within normal limits.  The basal ganglia are unremarkable in appearance.  The cerebral hemispheres demonstrate grossly normal gray-white differentiation. No mass effect or midline shift is seen.  There is no evidence of fracture; visualized osseous structures are unremarkable in appearance.  The orbits are within normal limits. There is partial opacification of the left maxillary sinus.  The remaining paranasal sinuses and mastoid air cells are well-aerated. No significant soft tissue abnormalities are seen.  Relatively diffuse vascular calcifications are seen.  IMPRESSION:  1.  No acute intracranial pathology seen on CT. 2.  Moderate cortical volume loss and scattered small vessel ischemic microangiopathy; chronic lacunar infarct again noted within the right putamen. 3.  Partial opacification of the left maxillary sinus. 4.  Relatively diffuse vascular calcifications seen.   Original Report Authenticated By: Tonia Ghent, M.D.    Dg Chest Portable 1 View  11/26/2012   *RADIOLOGY REPORT*  Clinical Data: Bradycardia.  PORTABLE CHEST - 1 VIEW  Comparison: 10/04/2012.  Findings: Stable enlarged cardiac silhouette and post CABG changes. Stable prominence of the interstitial markings.  Diffuse osteopenia.  IMPRESSION: Stable cardiomegaly and chronic interstitial lung disease.   Original Report Authenticated By: Beckie Salts, M.D.      Date: 12/03/2012  Rate: 100  Rhythm: atrial fibrillation  QRS  Axis: right  Intervals: normal  ST/T Wave abnormalities: nonspecific ST/T changes  Conduction Disutrbances:right bundle branch block  Narrative Interpretation:   Old EKG Reviewed: unchanged from 11/26/2012 HR was 63     1. Headache     Plan discharge  Devoria Albe, MD, FACEP   MDM          Ward Givens, MD 12/03/12 0630

## 2012-12-03 NOTE — ED Notes (Signed)
NURSE FIRST ROUNDS : PT. SITTING AT WAITING AREA , DENIES PAIN , RESPIRATIONS UNLABORED , NURSE UPDATED PT. AND FAMILY ON DELAY , WAIT TIME AND PROCESS.

## 2012-12-03 NOTE — ED Notes (Signed)
Pt up to ambulate with assistance. Able to walk without difficulty. Pt assisted back to bed. To be discharged, daughter at bedside.

## 2012-12-03 NOTE — ED Notes (Signed)
Discharge instructions discussed with patient and daughter. Pt to return home. Vital signs stable. Pt dressed and assisted to car via wheelchair.

## 2012-12-03 NOTE — Telephone Encounter (Signed)
Daughter call to ask about coumadin.  He did go to ER yesterday BP was stable, he had a headache and his INR was low.  He will take 5 mg of coumadin tonight and we will arrange INR and BP check tomorrow in the office.  His hydralazine has been decreased.  Difficult to tell how much on phone with daughter.  Daughter can come after 3 pm tomorrow with her father.

## 2012-12-03 NOTE — ED Notes (Signed)
Pt resting quietly at the time. He is alert and oriented x4. Vital signs stable. Daughter at bedside.

## 2012-12-04 ENCOUNTER — Telehealth: Payer: Self-pay | Admitting: *Deleted

## 2012-12-04 NOTE — Telephone Encounter (Signed)
Call to pt's daughter.  Stated pt's BP was 69/61 and 35/30 today.  Stated pt is just lying down and he won't eat or do anything.  Stated she doesn't know what to do because she was told to call today for him to come in for a BP check.  Daughter advised to take pt to ER for evaluation r/t low BP and other reports of pt's condition.  Daughter stated she is at work and will go get him at 12pm.  Daughter informed pt should go to ER now for evaluation.  Daughter thankful and stated she will go get pt when she gets off work.  Will keep appt for Friday for INR check/BP check if pt is not admitted to hospital.

## 2012-12-04 NOTE — Telephone Encounter (Signed)
Call to [facility] and left message to call back.  Will forward message to [place] per L. Annie Paras, NP.

## 2012-12-04 NOTE — Telephone Encounter (Signed)
FYI.Marland KitchenMarland KitchenMessage forwarded to Dr. Royann Shivers.

## 2012-12-05 ENCOUNTER — Encounter: Payer: Self-pay | Admitting: Physician Assistant

## 2012-12-05 ENCOUNTER — Ambulatory Visit (INDEPENDENT_AMBULATORY_CARE_PROVIDER_SITE_OTHER): Payer: Medicaid Other | Admitting: Pharmacist Clinician (PhC)/ Clinical Pharmacy Specialist

## 2012-12-05 ENCOUNTER — Ambulatory Visit (INDEPENDENT_AMBULATORY_CARE_PROVIDER_SITE_OTHER): Payer: Medicaid Other | Admitting: Physician Assistant

## 2012-12-05 VITALS — BP 80/60 | HR 82 | Ht 65.75 in | Wt 147.0 lb

## 2012-12-05 DIAGNOSIS — I951 Orthostatic hypotension: Secondary | ICD-10-CM

## 2012-12-05 DIAGNOSIS — R109 Unspecified abdominal pain: Secondary | ICD-10-CM

## 2012-12-05 DIAGNOSIS — R531 Weakness: Secondary | ICD-10-CM

## 2012-12-05 DIAGNOSIS — R5381 Other malaise: Secondary | ICD-10-CM

## 2012-12-05 DIAGNOSIS — Z7901 Long term (current) use of anticoagulants: Secondary | ICD-10-CM

## 2012-12-05 DIAGNOSIS — I10 Essential (primary) hypertension: Secondary | ICD-10-CM

## 2012-12-05 DIAGNOSIS — I959 Hypotension, unspecified: Secondary | ICD-10-CM

## 2012-12-05 DIAGNOSIS — I4891 Unspecified atrial fibrillation: Secondary | ICD-10-CM

## 2012-12-05 DIAGNOSIS — I7389 Other specified peripheral vascular diseases: Secondary | ICD-10-CM

## 2012-12-05 NOTE — Patient Instructions (Signed)
Your physician recommends that you keep your schedule a follow-up appointment with Dr. Marland KitchenC"

## 2012-12-05 NOTE — Telephone Encounter (Signed)
Spoke with Sonja: he is feeling a little better today and she will bring him to the office today to be evaluated (appointment with Wilburt Finlay). I asked her to make sure she brings his BP monitor. Continue to hold all antihypertensives, but do take metoprolol for AF rate control.

## 2012-12-06 ENCOUNTER — Telehealth: Payer: Self-pay | Admitting: *Deleted

## 2012-12-06 ENCOUNTER — Other Ambulatory Visit: Payer: Self-pay | Admitting: Physician Assistant

## 2012-12-06 DIAGNOSIS — N390 Urinary tract infection, site not specified: Secondary | ICD-10-CM

## 2012-12-06 DIAGNOSIS — I959 Hypotension, unspecified: Secondary | ICD-10-CM | POA: Insufficient documentation

## 2012-12-06 DIAGNOSIS — R109 Unspecified abdominal pain: Secondary | ICD-10-CM | POA: Insufficient documentation

## 2012-12-06 LAB — URINALYSIS, ROUTINE W REFLEX MICROSCOPIC
Bilirubin Urine: NEGATIVE
Glucose, UA: 100 mg/dL — AB
Hgb urine dipstick: NEGATIVE
Leukocytes, UA: NEGATIVE
pH: 5.5 (ref 5.0–8.0)

## 2012-12-06 LAB — URINALYSIS, MICROSCOPIC ONLY
Casts: NONE SEEN
Squamous Epithelial / LPF: NONE SEEN

## 2012-12-06 MED ORDER — CIPROFLOXACIN HCL 500 MG PO TABS: 500.0000 mg | ORAL_TABLET | Freq: Two times a day (BID) | ORAL | Status: DC

## 2012-12-06 NOTE — Telephone Encounter (Signed)
Spoke to [place]. Per Judie Grieve prescription for Cipro was sent to pharmacy for pick for the patient [name]. She voiced understanding

## 2012-12-06 NOTE — Assessment & Plan Note (Addendum)
Acute supra-pubic abdominal pain yes per day and the day before resolved today. Patient had decreased urine output and extra fluids but his daughter urine output returned.   We'll check urinalysis for possible UTI

## 2012-12-06 NOTE — Progress Notes (Signed)
Date:  12/06/2012   ID:  [name], DOB July 12, 1931, MRN [id number]  PCP:  Glenn Rily, MD  Primary Cardiologist:  [name]     History of Present Illness: [name] is a 79 y.o. male who is thin and has a history of permanent fibrillation and chronic warm and warfarin therapy. Coronary bypass grafting release 10 years ago. He also has peripheral vascular disease, diabetes mellitus type 2 and dyslipidemia. His last echocardiogram 09/25/2012 showed a left ventricular ejection fraction 50-55% with mild left ventricular hypertrophy and moderate to severely dilated left atrium and slightly  Elevated pulmonary artery pressures of 43 mm of mercury. No significant valvular abnormalities were seen.  Lower extremity arterial Dopplers April 2014 showed evidence of severe occlusive disease in both superficial femoral arteries. Did not appear to be any significant obstruction at the level of iliac or common femoral arteries both popliteals reconstituted via collateral vessels. He is 2 vessel runoff bilateral total occlusion of the posterior tibialis in each extremity. Renal ultrasound showed a 60-99% diameter reduction in right and left renal arteries. Right and left kidneys demonstrate elevated resistive indices consistent with parenchymal disease   The patient presents today to clinic with the assistance of his daughter who is his caregiver and provides translation as the patient is from [place] and does not speak [place].  It is somewhat difficult to ascertain a good history. She states the patient has been weak for approximately 2 weeks now also complaining of shortness of breath dizziness cough with clear production and possibly paroxysmal nocturnal dyspnea. She also reports yesterday and the day before he had the suprapubic abdominal pain and had no urinary output yesterday until she gave him extra water.  Abdominal pain has resolved currently.  She reports no hematuria or hematochezia. No chest  pain, lower extremity edema, nausea, vomiting, fever.  He was seen in the emergency room on 12/03/2012.  CT of the head without contrast showed no acute intracranial pathology. There is moderate cortical volume loss and scattered small vessel ischemic microangiopathy chronic lacunar infarct again noted within the right abdomen. There is partial opacification of left maxillary sinus. Relatively diffuse vascular calcification seen. Chest x-ray showed stable cardiomegaly and chronic interstitial lung disease.   INR was checked here in the clinic was 1.6  Wt Readings from Last 3 Encounters:  12/05/12 147 lb (66.679 kg)  11/27/12 151 lb 4.8 oz (68.629 kg)  11/27/12 151 lb 4.8 oz (68.629 kg)     Past Medical History  Diagnosis Date  . Permanent atrial fibrillation   . Hypertension   . HNP (herniated nucleus pulposus), lumbar   . PVD (peripheral vascular disease)   . Diabetes mellitus   . CAD (coronary artery disease)     s/p CABG x 4  . Ventricular hypertrophy   . Ischemic colitis   . Gastroenteritis, noninfectious   . Rectal bleeding     presumed secondary to ischemic colitis  . Depression     Current Outpatient Prescriptions  Medication Sig Dispense Refill  . ALPRAZolam (XANAX) 0.5 MG tablet Take 0.5 mg by mouth as needed for anxiety.      Marland Kitchen aspirin EC 81 MG EC tablet Take 1 tablet (81 mg total) by mouth daily.  30 tablet  0  . atorvastatin (LIPITOR) 20 MG tablet Take 1 tablet (20 mg total) by mouth daily.  30 tablet  6  . glipiZIDE (GLUCOTROL) 2.5 mg TABS Take 0.5 tablets (2.5 mg total) by mouth daily before breakfast.  30 tablet  0  . metoprolol tartrate (LOPRESSOR) 25 MG tablet Take 0.5 tablets (12.5 mg total) by mouth 2 (two) times daily.  30 tablet  6  . Nutritional Supplements (ENSURE PO) Take 1 Can by mouth 2 (two) times daily.       Marland Kitchen warfarin (COUMADIN) 5 MG tablet Take 2.5-5 mg by mouth See admin instructions. 1 tablet (5 mg) on Monday, Wednesday, Friday, 1/2 tablet (2.5 mg)  on all other days      . amLODipine (NORVASC) 10 MG tablet Take 1 tablet (10 mg total) by mouth daily.  30 tablet  6  . enalapril (VASOTEC) 20 MG tablet Take 1 tablet (20 mg total) by mouth daily.  30 tablet  6  . hydrALAZINE (APRESOLINE) 10 MG tablet Take 1 tablet (10 mg total) by mouth every 8 (eight) hours.  90 tablet  6  . HYDROcodone-acetaminophen (NORCO/VICODIN) 5-325 MG per tablet Take 1 tablet by mouth daily as needed for pain.      . metFORMIN (GLUCOPHAGE) 500 MG tablet Take 1 tablet (500 mg total) by mouth 2 (two) times daily with a meal.  60 tablet  6  . sertraline (ZOLOFT) 25 MG tablet Take 1 tablet (25 mg total) by mouth daily.  30 tablet  0  . traMADol (ULTRAM) 50 MG tablet Take 1 tablet (50 mg total) by mouth every 6 (six) hours as needed for pain (take during the day).  30 tablet  1   No current facility-administered medications for this visit.    Allergies:    Allergies  Allergen Reactions  . Percocet (Oxycodone-Acetaminophen) Other (See Comments)    "almost a heart attack"    Social History:  The patient  reports that he quit smoking about 21 years ago. His smoking use included Cigarettes. He has a 30 pack-year smoking history. He does not have any smokeless tobacco history on file. He reports that he does not drink alcohol or use illicit drugs.   ROS:  Please see the history of present illness.  All other systems reviewed and negative.   PHYSICAL EXAM: VS:  BP 80/60  Pulse 82  Ht 5' 5.75" (1.67 m)  Wt 147 lb (66.679 kg)  BMI 23.91 kg/m2 Thin weak-appearing in no acute distress HEENT: Pupils are equal round react to light accommodation extraocular movements are intact.  Neck: no JVDNo cervical lymphadenopathy. Cardiac: Irregularly irregular without murmur rub or gallop Lungs:  clear to auscultation bilaterally, no rhonchi right lower lobe cleared with cough Abd: soft, nontender, positive bowel sounds all quadrants, no hepatosplenomegaly Ext: no lower extremity  edema.  2+ left radial 1+ right radial and 1+ right dorsalis pedis pulses. No palpable left pedal pulses Skin: warm and dry Neuro:  Grossly normal, strength 2/5 equal upper and lower extremities  EKG:  Atrial fibrillation right bundle branch block chronic rate 82 beats per minute     ASSESSMENT AND PLAN:  Problem List Items Addressed This Visit   PVD WITH CLAUDICATION- he was going to be set up for OP PVA (Chronic)     Patient declines lotion the arterial Dopplers at last office visit. We'll readdress at next visit within a week or 2.  He would benefit greatly from lower extremity PV angiogram and possible intervention    Chronic anticoagulation (Chronic)     INR was checked today in clinic and was 1.6.  See Pharmacist note for medication dosing.    Atrial fibrillation with RVR - Primary  Currently rate controlled on Coumadin    Relevant Orders      EKG 12-Lead   Weakness     Related to volume depletion.    Abdominal pain: Suprapubic     Acute supra-pubic abdominal pain yes per day and the day before resolved today. Patient had decreased urine output and extra fluids but his daughter urine output returned.   We'll check urinalysis for possible UTI    Hypotension     5 different practitioners in the clinic today try to obtain a blood pressure on the patient and we were unsuccessful. It is likely the patient is volume depleted. I asked the patient's daughter to increase his fluid intake.     Other Visit Diagnoses   HTN (hypertension)        Orthostatic hypotension

## 2012-12-06 NOTE — Assessment & Plan Note (Addendum)
INR was checked today in clinic and was 1.6.  See Pharmacist note for medication dosing.

## 2012-12-06 NOTE — Assessment & Plan Note (Signed)
Related to volume depletion.

## 2012-12-06 NOTE — Assessment & Plan Note (Signed)
5 different practitioners in the clinic today try to obtain a blood pressure on the patient and we were unsuccessful. It is likely the patient is volume depleted. I asked the patient's daughter to increase his fluid intake.

## 2012-12-06 NOTE — Assessment & Plan Note (Addendum)
Patient declines lotion the arterial Dopplers at last office visit. We'll readdress at next visit within a week or 2.  He would benefit greatly from lower extremity PV angiogram and possible intervention

## 2012-12-06 NOTE — Assessment & Plan Note (Signed)
Currently rate controlled on Coumadin

## 2012-12-07 ENCOUNTER — Ambulatory Visit: Payer: Medicaid Other | Admitting: Pharmacist Clinician (PhC)/ Clinical Pharmacy Specialist

## 2012-12-07 ENCOUNTER — Ambulatory Visit: Payer: Self-pay | Admitting: Pharmacist Clinician (PhC)/ Clinical Pharmacy Specialist

## 2012-12-08 DIAGNOSIS — Z7901 Long term (current) use of anticoagulants: Secondary | ICD-10-CM | POA: Insufficient documentation

## 2012-12-08 DIAGNOSIS — I4891 Unspecified atrial fibrillation: Secondary | ICD-10-CM | POA: Insufficient documentation

## 2012-12-10 ENCOUNTER — Telehealth: Payer: Self-pay | Admitting: Physician Assistant

## 2012-12-10 DIAGNOSIS — N390 Urinary tract infection, site not specified: Secondary | ICD-10-CM

## 2012-12-10 MED ORDER — SULFAMETHOXAZOLE-TRIMETHOPRIM 400-80 MG PO TABS: 1.0000 | ORAL_TABLET | Freq: Two times a day (BID) | ORAL | Status: DC

## 2012-12-10 NOTE — Telephone Encounter (Signed)
Addendum:  Bactrim added instead of Macrobid for UTI.

## 2012-12-10 NOTE — Telephone Encounter (Signed)
Call to [facility].  Stated she got the med on Thursday afternoon and gave it to him.  Stated Saturday night pt couldn't take it anymore.  Stated pt had HA, dizziness, pain in arms & legs.  Stated pt couldn't eat or drink and she thinks the medicine is too strong.  Stated pt does not eat or drink and she wants something that is not as strong for pt.  Stated she called the nurse on call and was told to call back on Monday for a lesser dose.  Sonja informed B. Leron Croak, PA-C will be notified.  Message forwarded to B. Leron Croak, PA-C for further instructions.

## 2012-12-10 NOTE — Telephone Encounter (Signed)
Please call Erica-concerning the medicine that pt was given on 5-28-!

## 2012-12-10 NOTE — Telephone Encounter (Signed)
Discontinue Cipro start Macrobid 100 mg twice daily for 7 days.  Take with food.

## 2012-12-10 NOTE — Telephone Encounter (Signed)
Called and stated pt's daughter called them about having difficulty getting pt help.  Stated pt was given an abx by our office for UTI and was not able to tolerate the medicine per [name] (daughter) and she is trying to get [facility] to change the med, but needs records sent to them.  Sherry informed no call from pt r/t this issue and RN will call her for more information.  Cordelia Pen verbalized understanding.

## 2012-12-10 NOTE — Telephone Encounter (Signed)
Call to [facility] and informed.  Sonja verbalized understanding and agreed to call back if pt has any problems.

## 2012-12-12 ENCOUNTER — Ambulatory Visit (INDEPENDENT_AMBULATORY_CARE_PROVIDER_SITE_OTHER): Payer: Medicaid Other | Admitting: Cardiovascular Disease

## 2012-12-12 ENCOUNTER — Encounter: Payer: Self-pay | Admitting: Cardiovascular Disease

## 2012-12-12 ENCOUNTER — Ambulatory Visit (INDEPENDENT_AMBULATORY_CARE_PROVIDER_SITE_OTHER): Payer: Medicaid Other | Admitting: Pharmacist Clinician (PhC)/ Clinical Pharmacy Specialist

## 2012-12-12 ENCOUNTER — Ambulatory Visit: Payer: Medicaid Other | Admitting: Cardiovascular Disease

## 2012-12-12 VITALS — BP 90/50 | HR 88 | Resp 20 | Ht 66.0 in | Wt 145.4 lb

## 2012-12-12 DIAGNOSIS — I739 Peripheral vascular disease, unspecified: Secondary | ICD-10-CM

## 2012-12-12 DIAGNOSIS — Z7901 Long term (current) use of anticoagulants: Secondary | ICD-10-CM

## 2012-12-12 DIAGNOSIS — I2510 Atherosclerotic heart disease of native coronary artery without angina pectoris: Secondary | ICD-10-CM

## 2012-12-12 DIAGNOSIS — Z79899 Other long term (current) drug therapy: Secondary | ICD-10-CM

## 2012-12-12 DIAGNOSIS — K559 Vascular disorder of intestine, unspecified: Secondary | ICD-10-CM

## 2012-12-12 DIAGNOSIS — I959 Hypotension, unspecified: Secondary | ICD-10-CM

## 2012-12-12 DIAGNOSIS — R5381 Other malaise: Secondary | ICD-10-CM

## 2012-12-12 DIAGNOSIS — I4891 Unspecified atrial fibrillation: Secondary | ICD-10-CM

## 2012-12-12 DIAGNOSIS — I701 Atherosclerosis of renal artery: Secondary | ICD-10-CM

## 2012-12-12 DIAGNOSIS — E1165 Type 2 diabetes mellitus with hyperglycemia: Secondary | ICD-10-CM

## 2012-12-12 NOTE — Patient Instructions (Signed)
Your physician recommends that you return for lab work in: Tomorrow Your physician recommends that you schedule a follow-up appointment in: 1 week

## 2012-12-13 ENCOUNTER — Ambulatory Visit: Payer: Medicaid Other | Admitting: Cardiovascular Disease

## 2012-12-13 ENCOUNTER — Encounter: Payer: Self-pay | Admitting: Cardiovascular Disease

## 2012-12-13 DIAGNOSIS — I739 Peripheral vascular disease, unspecified: Secondary | ICD-10-CM | POA: Insufficient documentation

## 2012-12-13 DIAGNOSIS — I701 Atherosclerosis of renal artery: Secondary | ICD-10-CM | POA: Insufficient documentation

## 2012-12-13 LAB — CBC
HCT: 38.5 % — ABNORMAL LOW (ref 39.0–52.0)
Hemoglobin: 12.8 g/dL — ABNORMAL LOW (ref 13.0–17.0)
MCH: 28.9 pg (ref 26.0–34.0)
MCHC: 33.2 g/dL (ref 30.0–36.0)

## 2012-12-13 LAB — COMPREHENSIVE METABOLIC PANEL
ALT: 20 U/L (ref 0–53)
AST: 20 U/L (ref 0–37)
Albumin: 4.3 g/dL (ref 3.5–5.2)
BUN: 29 mg/dL — ABNORMAL HIGH (ref 6–23)
Calcium: 8.8 mg/dL (ref 8.4–10.5)
Glucose, Bld: 211 mg/dL — ABNORMAL HIGH (ref 70–99)
Total Protein: 7 g/dL (ref 6.0–8.3)

## 2012-12-13 NOTE — Assessment & Plan Note (Signed)
Clinically he appears to be hypovolemic and there is really no evidence whatsoever of congestive heart failure. Despite the fact that he has a long-standing history of coronary disease he had a very recent nuclear stress test that showed an inferior scar without evidence of reversible ischemia. He denies chest pain in a pattern that would suggest angina. Left ventricular systolic function is at the lower limit of normal.

## 2012-12-13 NOTE — Assessment & Plan Note (Signed)
Currently his oral intake is very poor and I agree that he should remain off of oral antidiabetic. In view of his rather volatile renal function and advanced age I am not sure that metformin is a great choice.

## 2012-12-13 NOTE — Assessment & Plan Note (Signed)
Ventricular rate is well controlled and he is on therapeutic anticoagulation. There is no objective evidence to suggest cardioembolic events.

## 2012-12-13 NOTE — Progress Notes (Signed)
Patient ID: [name], male   DOB: 04/02/1932, 79 y.o.   MRN: [id number]      Reason for office visit Hypotension  [name] returns for followup of severe hypotension. He remains quite ill but there are some signs of improvement. His oral intake is a little bit better and his blood pressure today is higher although quite low at 90/50. He has been off all antihypertensives for almost a week.  The patient presented 5/29 to clinic with the assistance of his daughter who is his caregiver and provides translation as the patient does not speak [place].  It is somewhat difficult to ascertain a good history. She states the patient has been weak for approximately 2 weeks now also complaining of shortness of breath dizziness cough and possibly paroxysmal nocturnal dyspnea. She also reports yesterday and the day before he had the suprapubic abdominal pain and had no urinary output yesterday until she gave him extra water.  Abdominal pain has resolved currently.  She reports no hematuria or hematochezia. No chest pain, lower extremity edema, nausea, vomiting, fever. He was seen in the emergency room on 12/03/2012.  CT of the head without contrast showed no acute intracranial pathology. There is moderate cortical volume loss and scattered small vessel ischemic microangiopathy chronic lacunar infarct again noted within the right abdomen. There is partial opacification of left maxillary sinus. Relatively diffuse vascular calcification seen. Chest x-ray showed stable cardiomegaly and chronic interstitial lung disease.   Treatment with Cipro started for suspected urinary tract infection. His daughter believes that this lead to some improvement but the patient complained of headaches and generalized body aches 2-3 hours after each dose of medication so it was finally interrupted. Bactrim was substituted.  He now feels slightly better but remains ill. His oral intake remains suboptimal. He has intermittent  suprapubic discomfort, albeit not as severe. He has now been off all antihypertensive medications for a almost a week and his blood pressure is only 90/50 mm Hg   Allergies  Allergen Reactions  . Percocet (Oxycodone-Acetaminophen) Other (See Comments)    "almost a heart attack"    Current Outpatient Prescriptions  Medication Sig Dispense Refill  . ALPRAZolam (XANAX) 0.5 MG tablet Take 0.5 mg by mouth as needed for anxiety.      Marland Kitchen aspirin EC 81 MG EC tablet Take 1 tablet (81 mg total) by mouth daily.  30 tablet  0  . atorvastatin (LIPITOR) 20 MG tablet Take 1 tablet (20 mg total) by mouth daily.  30 tablet  6  . glipiZIDE (GLUCOTROL) 2.5 mg TABS Take 0.5 tablets (2.5 mg total) by mouth daily before breakfast.  30 tablet  0  . metoprolol tartrate (LOPRESSOR) 25 MG tablet Take 0.5 tablets (12.5 mg total) by mouth 2 (two) times daily.  30 tablet  6  . sulfamethoxazole-trimethoprim (BACTRIM) 400-80 MG per tablet Take 1 tablet by mouth 2 (two) times daily.  10 tablet  0  . amLODipine (NORVASC) 10 MG tablet Take 1 tablet (10 mg total) by mouth daily.  30 tablet  6  . enalapril (VASOTEC) 20 MG tablet Take 1 tablet (20 mg total) by mouth daily.  30 tablet  6  . hydrALAZINE (APRESOLINE) 10 MG tablet Take 1 tablet (10 mg total) by mouth every 8 (eight) hours.  90 tablet  6  . HYDROcodone-acetaminophen (NORCO/VICODIN) 5-325 MG per tablet Take 1 tablet by mouth daily as needed for pain.      . metFORMIN (GLUCOPHAGE) 500 MG tablet  Take 1 tablet (500 mg total) by mouth 2 (two) times daily with a meal.  60 tablet  6  . Nutritional Supplements (ENSURE PO) Take 1 Can by mouth 2 (two) times daily.       . sertraline (ZOLOFT) 25 MG tablet Take 1 tablet (25 mg total) by mouth daily.  30 tablet  0  . traMADol (ULTRAM) 50 MG tablet Take 1 tablet (50 mg total) by mouth every 6 (six) hours as needed for pain (take during the day).  30 tablet  1  . warfarin (COUMADIN) 5 MG tablet Take 2.5-5 mg by mouth See admin  instructions. 1 tablet (5 mg) on Monday, Wednesday, Friday, 1/2 tablet (2.5 mg) on all other days       No current facility-administered medications for this visit.    Past Medical History  Diagnosis Date  . Permanent atrial fibrillation   . Hypertension   . HNP (herniated nucleus pulposus), lumbar   . PVD (peripheral vascular disease)   . Diabetes mellitus   . CAD (coronary artery disease)     s/p CABG x 4  . Ventricular hypertrophy   . Ischemic colitis   . Gastroenteritis, noninfectious   . Rectal bleeding     presumed secondary to ischemic colitis  . Depression     Past Surgical History  Procedure Laterality Date  . Cardiac catheterization  01/2002    40-50% distal LM/ostial LAD disease, 50-60% ostial LAD, 60% mid LAD, 30-40% D1, 70% tubular mid LCx, 70% ostial disease in two ramus intermedius branches, 70% prox RCA, 50-60% mid RCA, 70% distal RCA, LVEF 40%   . Coronary artery bypass graft  01/2002    x 4: LIMA-LAD, SVG-OM, SVG-PDA-PLB  . Cholecystectomy N/A 09/27/2012    Procedure: LAPAROSCOPIC CHOLECYSTECTOMY WITH INTRAOPERATIVE CHOLANGIOGRAM;  Surgeon: Lodema Pilot, DO;  Location: [facility] OR;  Service: General;  Laterality: N/A;    Family History  Problem Relation Age of Onset  . Heart disease Mother   . Heart disease Sister   . Breast cancer Sister     History   Social History  . Marital Status: Widowed    Spouse Name: N/A    Number of Children: N/A  . Years of Education: N/A   Occupational History  . Not on file.   Social History Main Topics  . Smoking status: Former Smoker -- 1.00 packs/day for 30 years    Types: Cigarettes    Quit date: 02/15/1991  . Smokeless tobacco: Not on file  . Alcohol Use: No  . Drug Use: No  . Sexually Active: Not on file   Other Topics Concern  . Not on file   Social History Narrative  . No narrative on file    Review of systems: This is somewhat difficult to obtain but as far as I can tell he has not had fever or  chills, melena, hematochezia and he recently had a normal bowel movement. He does not have diarrhea. He denies nausea and has not vomited. He has not noticed any hematuria but his urine output remains very scanty. No focal neurological deficits such as dysarthria aphasia localized weakness or visual changes have been reported. She has not had any evidence of easy bruising or bleeding despite warfarin therapy. He spends most of the day in bed but is able to get up and walk to the kitchen or in the bathroom. He refuses to be served meals in bed.  PHYSICAL EXAM BP 90/50  Pulse 88  Resp 20  Ht 5\' 6"  (1.676 m)  Wt 145 lb 6.4 oz (65.953 kg)  BMI 23.48 kg/m2 Thin weak-appearing in no acute distress HEENT: Pupils are equal round react to light accommodation extraocular movements are intact. Moist Neck: Flat JVP, bilateral carotid bruitsNo cervical lymphadenopathy.  Cardiac: Irregularly irregular without murmur rub or gallop, widely split second heart sound Lungs:  clear to auscultation bilaterally, no rhonchi right lower lobe cleared with cough Abd: soft, nontender, positive bowel sounds all quadrants, no hepatosplenomegaly Ext: no lower extremity edema.  2+ left radial 1+ right radial and 1+ right dorsalis pedis pulses. No palpable left pedal pulses Skin: warm and dry, possibly mildly reduced skin turgor Neuro:  Grossly normal, strength 4/5 equal upper and lower extremities  EKG: Atrial fibrillation with controlled rate, right bundle branch block  Lipid Panel     Component Value Date/Time   CHOL 145 09/25/2012 0656   TRIG 86 09/25/2012 0656   HDL 39* 09/25/2012 0656   CHOLHDL 3.7 09/25/2012 0656   VLDL 17 09/25/2012 0656   LDLCALC 89 09/25/2012 0656    BMET    Component Value Date/Time   NA 132* 12/13/2012 0835   K 4.5 12/13/2012 0835   CL 101 12/13/2012 0835   CO2 24 12/13/2012 0835   GLUCOSE 211* 12/13/2012 0835   BUN 29* 12/13/2012 0835   CREATININE 1.67* 12/13/2012 0835   CREATININE 1.41*  12/02/2012 2210   CALCIUM 8.8 12/13/2012 0835   GFRNONAA 46* 12/02/2012 2210   GFRAA 54* 12/02/2012 2210     ASSESSMENT AND PLAN Hypotension The cause of this remains elusive. He has gone from taking 3 different antihypertensive medications to be hypotensive on none. It is tempting to believe that this may be related to one of his recent abdominal problems such as the ischemic colitis or the acute cholecystitis and its complications. However his abdominal exam today is very benign. By physical exam he clearly appears to be dehydrated and the reason for this appears to be reduced oral intake. It is hard to say why he is not eating and drinking, especially with the translation barrier. For the time being he lives to remain off all antihypertensive medications. It is possible that he did have an infection but the urinalysis is not really very compelling. His daughter do seem to believe that he improved quickly while taking ciprofloxacin although he had to stop this medication due to side effects. It is reasonable to finish his course of antibiotics.  We will recheck his metabolic panel and blood counts today for any hints to wards the etiology of this problem and also to reevaluate renal function.  Ischemic colitis March 2014 Imaging studies have demonstrated severe atherosclerosis of the abdominal aorta and visceral arteries and he had a fairly convincing presentation of ischemic colitis a couple of months ago. At this time however he does not have overt abdominal pain and his abdominal exam is extremely benign. Abdomen is soft and the bowel sounds are normal. He has had normal bowel movements. There has been no rectal bleeding.  Atrial fibrillation Ventricular rate is well controlled and he is on therapeutic anticoagulation. There is no objective evidence to suggest cardioembolic events.  CAD, CABG X 4 2003. Myoview low risk 3/14 Clinically he appears to be hypovolemic and there is really no evidence  whatsoever of congestive heart failure. Despite the fact that he has a long-standing history of coronary disease he had a very recent nuclear stress test that showed an  inferior scar without evidence of reversible ischemia. He denies chest pain in a pattern that would suggest angina. Left ventricular systolic function is at the lower limit of normal.  Diabetes mellitus type 2, uncontrolled Currently his oral intake is very poor and I agree that he should remain off of oral antidiabetic. In view of his rather volatile renal function and advanced age I am not sure that metformin is a great choice.  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 Until the current illness and hypotension resolved, peripheral angiography (which had been planned) should be delayed. I'm not sure he is a good candidate for angiography with his recent episode of ischemic colitis. The severity of his abdominal aortic atherosclerosis may preclude safe revascularization of the lower extremities unless it is done with an antegrade stick.   Is encouraged to increase his intake of fluids and protein rich mood. We will see him frequently in the clinic until the acute illness resolves.  Orders Placed This Encounter  Procedures  . CBC  . Comp Met (CMET)  . EKG 12-Lead     Angelyn Osterberg  Thurmon Fair, MD, [facility] [phone] office [phone] pager

## 2012-12-13 NOTE — Assessment & Plan Note (Signed)
The cause of this remains elusive. He has gone from taking 3 different antihypertensive medications to be hypotensive on none. It is tempting to believe that this may be related to one of his recent abdominal problems such as the ischemic colitis or the acute cholecystitis and its complications. However his abdominal exam today is very benign. By physical exam he clearly appears to be dehydrated and the reason for this appears to be reduced oral intake. It is hard to say why he is not eating and drinking, especially with the translation barrier. For the time being he lives to remain off all antihypertensive medications. It is possible that he did have an infection but the urinalysis is not really very compelling. His daughter do seem to believe that he improved quickly while taking ciprofloxacin although he had to stop this medication due to side effects. It is reasonable to finish his course of antibiotics.  We will recheck his metabolic panel and blood counts today for any hints to wards the etiology of this problem and also to reevaluate renal function.

## 2012-12-13 NOTE — Assessment & Plan Note (Signed)
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 Until the current illness and hypotension resolved, peripheral angiography (which had been planned) should be delayed. I'm not sure he is a good candidate for angiography with his recent episode of ischemic colitis. The severity of his abdominal aortic atherosclerosis may preclude safe revascularization of the lower extremities unless it is done with an antegrade stick.

## 2012-12-13 NOTE — Assessment & Plan Note (Signed)
Imaging studies have demonstrated severe atherosclerosis of the abdominal aorta and visceral arteries and he had a fairly convincing presentation of ischemic colitis a couple of months ago. At this time however he does not have overt abdominal pain and his abdominal exam is extremely benign. Abdomen is soft and the bowel sounds are normal. He has had normal bowel movements. There has been no rectal bleeding.

## 2012-12-14 ENCOUNTER — Telehealth: Payer: Self-pay | Admitting: Cardiovascular Disease

## 2012-12-14 NOTE — Telephone Encounter (Signed)
Attempted to call to schedule appt with Dr. Salena Saner

## 2012-12-20 ENCOUNTER — Telehealth: Payer: Self-pay | Admitting: *Deleted

## 2012-12-20 NOTE — Telephone Encounter (Signed)
His enalapril is currently on hold due to low BP. He doesn't need any right now

## 2012-12-20 NOTE — Telephone Encounter (Signed)
Voicemail received 6.11.14 @ 6:49pm from Logan w/ [facility].  Stated "Need new Rx for patient. We have him on 2 strengths."  Returned call.  Informed pt has two Rxs for enalapril, 10mg  and 20mg .  Asking for clarification.  Per Epic, pt taking enalapril 20mg   Daily and no change in meds at last OV on 6.2.14.  Informed Dr. Royann Shivers will be notified for further instructions and RN will call back.  Verbalized understanding.  Paper chart requested.

## 2012-12-24 ENCOUNTER — Ambulatory Visit (INDEPENDENT_AMBULATORY_CARE_PROVIDER_SITE_OTHER): Payer: Medicaid Other | Admitting: Pharmacist Clinician (PhC)/ Clinical Pharmacy Specialist

## 2012-12-24 VITALS — BP 118/60 | HR 56

## 2012-12-24 DIAGNOSIS — Z7901 Long term (current) use of anticoagulants: Secondary | ICD-10-CM

## 2012-12-24 DIAGNOSIS — I4891 Unspecified atrial fibrillation: Secondary | ICD-10-CM

## 2012-12-24 LAB — POCT INR: INR: 1.6

## 2012-12-24 MED ORDER — METOPROLOL TARTRATE 25 MG PO TABS: 12.5000 mg | ORAL_TABLET | Freq: Two times a day (BID) | ORAL | Status: DC

## 2012-12-24 NOTE — Telephone Encounter (Signed)
Verified the patient is not taking enalapril.  He is still holding due to hypotension.  BP today 116/60.

## 2013-01-02 ENCOUNTER — Ambulatory Visit (INDEPENDENT_AMBULATORY_CARE_PROVIDER_SITE_OTHER): Payer: Medicaid Other | Admitting: Pharmacist Clinician (PhC)/ Clinical Pharmacy Specialist

## 2013-01-02 ENCOUNTER — Encounter: Payer: Self-pay | Admitting: Cardiovascular Disease

## 2013-01-02 ENCOUNTER — Ambulatory Visit (INDEPENDENT_AMBULATORY_CARE_PROVIDER_SITE_OTHER): Payer: Medicaid Other | Admitting: Cardiovascular Disease

## 2013-01-02 VITALS — BP 100/60 | HR 88 | Ht 66.0 in | Wt 148.1 lb

## 2013-01-02 DIAGNOSIS — I701 Atherosclerosis of renal artery: Secondary | ICD-10-CM

## 2013-01-02 DIAGNOSIS — I4891 Unspecified atrial fibrillation: Secondary | ICD-10-CM

## 2013-01-02 DIAGNOSIS — Z7901 Long term (current) use of anticoagulants: Secondary | ICD-10-CM

## 2013-01-02 DIAGNOSIS — E785 Hyperlipidemia, unspecified: Secondary | ICD-10-CM

## 2013-01-02 DIAGNOSIS — I2510 Atherosclerotic heart disease of native coronary artery without angina pectoris: Secondary | ICD-10-CM

## 2013-01-02 DIAGNOSIS — I7389 Other specified peripheral vascular diseases: Secondary | ICD-10-CM

## 2013-01-02 DIAGNOSIS — I4821 Permanent atrial fibrillation: Secondary | ICD-10-CM

## 2013-01-02 NOTE — Patient Instructions (Addendum)
Your physician recommends that you schedule a follow-up appointment in: 3 months.  

## 2013-01-02 NOTE — Progress Notes (Signed)
Patient ID: [name], male   DOB: Jan 12, 1932, 79 y.o.   MRN: [id number]    Reason for office visit PAD, bilateral renal artery stenosis, CAD p CABG, hypotension  Feels and looks much better. Appetite back. Still complains of leg pain while lying in bed but it sounds musculoskeletal/neurological rather than vascular. Known to have bilateral SFA occlusion and barely palpable pedal pulses, but has excellent capillary refill and warm feet. We had planned peripheral angiography but then he became profoundly hypotensive. We have had to discontinued almost all of his antihypertensives except for low dose of beta blocker. I think it is best we avoid invasive procedures. I think he is more fragile than initially apparent. Amlodipine enalapril hydralazine and metformin have been permanently discontinued (they have been on hold for while now). He is therapeutically anticoagulated on warfarin. He should continue taking low dose Toprol (a fibrillation rate control), with atorvastatin, aspirin, glipizide. Encouraged to walk to the point of claudication to stimulate collateral formation. Follow up in 3 months    Allergies  Allergen Reactions  . Percocet (Oxycodone-Acetaminophen) Other (See Comments)    "almost a heart attack"    Current Outpatient Prescriptions  Medication Sig Dispense Refill  . ALPRAZolam (XANAX) 0.5 MG tablet Take 0.5 mg by mouth as needed for anxiety.      Marland Kitchen aspirin EC 81 MG EC tablet Take 1 tablet (81 mg total) by mouth daily.  30 tablet  0  . atorvastatin (LIPITOR) 20 MG tablet Take 1 tablet (20 mg total) by mouth daily.  30 tablet  6  . glipiZIDE (GLUCOTROL) 2.5 mg TABS Take 0.5 tablets (2.5 mg total) by mouth daily before breakfast.  30 tablet  0  . metoprolol tartrate (LOPRESSOR) 25 MG tablet Take 0.5 tablets (12.5 mg total) by mouth 2 (two) times daily.  30 tablet  6  . Nutritional Supplements (ENSURE PO) Take 1 Can by mouth 2 (two) times daily.       . sertraline (ZOLOFT) 25  MG tablet Take 1 tablet (25 mg total) by mouth daily.  30 tablet  0  . traMADol (ULTRAM) 50 MG tablet Take 1 tablet (50 mg total) by mouth every 6 (six) hours as needed for pain (take during the day).  30 tablet  1  . warfarin (COUMADIN) 5 MG tablet Take 2.5-5 mg by mouth See admin instructions. 1 tablet (5 mg) on Monday, Wednesday, Friday, 1/2 tablet (2.5 mg) on all other days      . HYDROcodone-acetaminophen (NORCO/VICODIN) 5-325 MG per tablet Take 1 tablet by mouth daily as needed for pain.      Marland Kitchen sulfamethoxazole-trimethoprim (BACTRIM) 400-80 MG per tablet Take 1 tablet by mouth 2 (two) times daily.  10 tablet  0   No current facility-administered medications for this visit.    Past Medical History  Diagnosis Date  . Permanent atrial fibrillation   . Hypertension   . HNP (herniated nucleus pulposus), lumbar   . PVD (peripheral vascular disease)   . Diabetes mellitus   . CAD (coronary artery disease)     s/p CABG x 4  . Ventricular hypertrophy   . Ischemic colitis   . Gastroenteritis, noninfectious   . Rectal bleeding     presumed secondary to ischemic colitis  . Depression     Past Surgical History  Procedure Laterality Date  . Cardiac catheterization  01/2002    40-50% distal LM/ostial LAD disease, 50-60% ostial LAD, 60% mid LAD, 30-40% D1, 70% tubular mid  LCx, 70% ostial disease in two ramus intermedius branches, 70% prox RCA, 50-60% mid RCA, 70% distal RCA, LVEF 40%   . Coronary artery bypass graft  01/2002    x 4: LIMA-LAD, SVG-OM, SVG-PDA-PLB  . Cholecystectomy N/A 09/27/2012    Procedure: LAPAROSCOPIC CHOLECYSTECTOMY WITH INTRAOPERATIVE CHOLANGIOGRAM;  Surgeon: Lodema Pilot, DO;  Location: [facility] OR;  Service: General;  Laterality: N/A;    Family History  Problem Relation Age of Onset  . Heart disease Mother   . Heart disease Sister   . Breast cancer Sister     History   Social History  . Marital Status: Widowed    Spouse Name: N/A    Number of Children: N/A  .  Years of Education: N/A   Occupational History  . Not on file.   Social History Main Topics  . Smoking status: Former Smoker -- 1.00 packs/day for 30 years    Types: Cigarettes    Quit date: 02/15/1991  . Smokeless tobacco: Not on file  . Alcohol Use: No  . Drug Use: No  . Sexually Active: Not on file   Other Topics Concern  . Not on file   Social History Narrative  . No narrative on file    Review of systems: The patient specifically denies any chest pain at rest or with exertion, dyspnea at rest or with exertion, orthopnea, paroxysmal nocturnal dyspnea, syncope, palpitations, focal neurological deficits,, lower extremity edema, unexplained weight gain, cough, hemoptysis or wheezing.  The patient also denies abdominal pain, nausea, vomiting, dysphagia, diarrhea, constipation, polyuria, polydipsia, dysuria, hematuria, frequency, urgency, abnormal bleeding or bruising, fever, chills, unexpected weight changes, mood swings, change in skin or hair texture, change in voice quality, auditory or visual problems, allergic reactions or rashes, new musculoskeletal complaints other than usual "aches and pains".   PHYSICAL EXAM BP 100/60  Pulse 88  Ht 5\' 6"  (1.676 m)  Wt 148 lb 1.6 oz (67.178 kg)  BMI 23.92 kg/m2 Thin weak-appearing in no acute distress  HEENT: Pupils are equal round react to light accommodation extraocular movements are intact. Moist  Neck: Flat JVP, bilateral carotid bruits No cervical lymphadenopathy.  Cardiac: Irregularly irregular without murmur rub or gallop, widely split second heart sound  Lungs: clear to auscultation bilaterally, no rhonchi right lower lobe cleared with cough  Abd: soft, nontender, positive bowel sounds all quadrants, no hepatosplenomegaly  Ext: no lower extremity edema. 2+ left radial 1+ right radial and 1+ right dorsalis pedis pulses. No palpable left pedal pulses  Skin: warm and dry, possibly mildly reduced skin turgor  Neuro: Grossly  normal, strength 4/5 equal upper and lower extremities   EKG: atrial fibrillation  Lipid Panel     Component Value Date/Time   CHOL 145 09/25/2012 0656   TRIG 86 09/25/2012 0656   HDL 39* 09/25/2012 0656   CHOLHDL 3.7 09/25/2012 0656   VLDL 17 09/25/2012 0656   LDLCALC 89 09/25/2012 0656    BMET    Component Value Date/Time   NA 132* 12/13/2012 0835   K 4.5 12/13/2012 0835   CL 101 12/13/2012 0835   CO2 24 12/13/2012 0835   GLUCOSE 211* 12/13/2012 0835   BUN 29* 12/13/2012 0835   CREATININE 1.67* 12/13/2012 0835   CREATININE 1.41* 12/02/2012 2210   CALCIUM 8.8 12/13/2012 0835   GFRNONAA 46* 12/02/2012 2210   GFRAA 54* 12/02/2012 2210     ASSESSMENT AND PLAN Permanent atrial fibrillation Rate control is adequate. Warfarin anticoagulation at therapeutic levels.  PVD  WITH CLAUDICATION- he was going to be set up for OP PVA He continues to have moderate intermittent claudication that is lifestyle limiting.Duplex US suggests bilateral SFA occlusion with popliteal reconstitution and two vessel runoff (both PTA occluded). Peripheral angiography was planned and delayed after his episode of hypotension and probable ischemic colitis. May need to revisit in near future.  CAD, CABG X 4 2003. Myoview low risk 3/14 Possible limited inferolateral scar, no ischemia, normal LVEF. Currently angina free, without clinical signs of CHF.  Renal artery stenosis, native, bilateral He has now been off ACE inhibitors for weeks and his relative hypotension persists. According to his daughter he is taking enalapril for years as a prescription from [place] and this has not been a problem. Nevertheless, bilateral RAS and ACE inhibitors remain the only clear explanation for his period of severe hypotension. He has CKD stage 2-3. Will discuss again with Dr. Allyson Sabal regarding angio and possible stenting if he remains stable.    Junious Silk, MD, [facility] [phone]  office [phone] pager

## 2013-01-09 ENCOUNTER — Telehealth: Payer: Self-pay | Admitting: Cardiovascular Disease

## 2013-01-09 NOTE — Telephone Encounter (Signed)
I would not change metoprolol. It was working fine to control his heartrate.  Dr. Rennis Golden

## 2013-01-09 NOTE — Telephone Encounter (Signed)
Please call-question about his Diltiazem!

## 2013-01-09 NOTE — Telephone Encounter (Signed)
Returned call and spoke w/ Sonja.  Stated pt doesn't like the metoprolol and will not take it.  Stated pt is asking to be put back on diltiazem b/c he wasn't having problems with it and they changed it in the hospital.  [name] asked if they were the same medication and if pt could get it.  Informed they are not the same medication and a provider would have to decide if pt can take it or not.  Sonja also informed Dr. Royann Shivers is out of the office x 2 weeks and another provider will be notified for further instructions.  Sonja verbalized understanding and agreed w/ plan.  Message forwarded to Dr. Rennis Golden to review in Dr. Erin Hearing absence.  Paper chart# [id number] on cart.

## 2013-01-09 NOTE — Telephone Encounter (Signed)
Returned call.  Left message w/ advice per Dr. Rennis Golden and to call back tomorrow before 4pm if questions.

## 2013-01-16 ENCOUNTER — Ambulatory Visit (INDEPENDENT_AMBULATORY_CARE_PROVIDER_SITE_OTHER): Payer: Medicaid Other | Admitting: Pharmacist Clinician (PhC)/ Clinical Pharmacy Specialist

## 2013-01-16 VITALS — HR 72

## 2013-01-16 DIAGNOSIS — I4891 Unspecified atrial fibrillation: Secondary | ICD-10-CM

## 2013-01-16 DIAGNOSIS — Z7901 Long term (current) use of anticoagulants: Secondary | ICD-10-CM

## 2013-01-27 ENCOUNTER — Encounter: Payer: Self-pay | Admitting: Cardiovascular Disease

## 2013-01-27 DIAGNOSIS — E785 Hyperlipidemia, unspecified: Secondary | ICD-10-CM | POA: Insufficient documentation

## 2013-01-27 NOTE — Assessment & Plan Note (Signed)
He continues to have moderate intermittent claudication that is lifestyle limiting.Duplex US suggests bilateral SFA occlusion with popliteal reconstitution and two vessel runoff (both PTA occluded). Peripheral angiography was planned and delayed after his episode of hypotension and probable ischemic colitis. May need to revisit in near future.

## 2013-01-27 NOTE — Assessment & Plan Note (Signed)
He has now been off ACE inhibitors for weeks and his relative hypotension persists. According to his daughter he is taking enalapril for years as a prescription from [place] and this has not been a problem. Nevertheless, bilateral RAS and ACE inhibitors remain the only clear explanation for his period of severe hypotension. He has CKD stage 2-3. Will discuss again with Dr. Allyson Sabal regarding angio and possible stenting if he remains stable.

## 2013-01-27 NOTE — Assessment & Plan Note (Signed)
Possible limited inferolateral scar, no ischemia, normal LVEF. Currently angina free, without clinical signs of CHF.

## 2013-01-27 NOTE — Assessment & Plan Note (Signed)
Rate control is adequate. Warfarin anticoagulation at therapeutic levels.

## 2013-02-11 ENCOUNTER — Ambulatory Visit: Payer: Medicaid Other | Admitting: Pharmacist Clinician (PhC)/ Clinical Pharmacy Specialist

## 2013-02-14 ENCOUNTER — Ambulatory Visit (INDEPENDENT_AMBULATORY_CARE_PROVIDER_SITE_OTHER): Payer: Medicaid Other | Admitting: Pharmacist Clinician (PhC)/ Clinical Pharmacy Specialist

## 2013-02-14 DIAGNOSIS — Z7901 Long term (current) use of anticoagulants: Secondary | ICD-10-CM

## 2013-02-14 DIAGNOSIS — I4891 Unspecified atrial fibrillation: Secondary | ICD-10-CM

## 2013-03-13 ENCOUNTER — Ambulatory Visit (INDEPENDENT_AMBULATORY_CARE_PROVIDER_SITE_OTHER): Payer: Medicaid Other | Admitting: Cardiovascular Disease

## 2013-03-13 ENCOUNTER — Other Ambulatory Visit: Payer: Self-pay | Admitting: Cardiovascular Disease

## 2013-03-13 ENCOUNTER — Ambulatory Visit (INDEPENDENT_AMBULATORY_CARE_PROVIDER_SITE_OTHER): Payer: Medicaid Other | Admitting: Pharmacist Clinician (PhC)/ Clinical Pharmacy Specialist

## 2013-03-13 ENCOUNTER — Encounter: Payer: Self-pay | Admitting: Cardiovascular Disease

## 2013-03-13 VITALS — BP 98/60 | HR 64 | Ht 66.0 in | Wt 153.7 lb

## 2013-03-13 DIAGNOSIS — I4891 Unspecified atrial fibrillation: Secondary | ICD-10-CM

## 2013-03-13 DIAGNOSIS — Z7901 Long term (current) use of anticoagulants: Secondary | ICD-10-CM

## 2013-03-13 DIAGNOSIS — Z79899 Other long term (current) drug therapy: Secondary | ICD-10-CM

## 2013-03-13 DIAGNOSIS — I739 Peripheral vascular disease, unspecified: Secondary | ICD-10-CM

## 2013-03-13 DIAGNOSIS — I2510 Atherosclerotic heart disease of native coronary artery without angina pectoris: Secondary | ICD-10-CM

## 2013-03-13 DIAGNOSIS — I701 Atherosclerosis of renal artery: Secondary | ICD-10-CM

## 2013-03-13 MED ORDER — DILTIAZEM HCL ER COATED BEADS 180 MG PO CP24: 180.0000 mg | ORAL_CAPSULE | Freq: Every day | ORAL | Status: DC

## 2013-03-13 NOTE — Patient Instructions (Addendum)
Your physician has recommended you make the following change in your medication: Start diltiazem sustained release 180 mg once daily Followup with Phylis Bougie, PharmD in 2 weeks. Followup with Dr. Royann Shivers in 4 weeks Your physician recommends that you return for lab work in: 10 days

## 2013-03-14 ENCOUNTER — Ambulatory Visit: Payer: Medicaid Other | Admitting: Pharmacist Clinician (PhC)/ Clinical Pharmacy Specialist

## 2013-03-14 NOTE — Progress Notes (Signed)
Patient ID: [name], male   DOB: 10/11/1931, 79 y.o.   MRN: [id number]     Reason for office visit Renovascular hypertension, coronary artery disease, atrial fibrillation   [name] returns accompanied by his daughter [name]. As not been feeling well. He has a persistent headache and feels his head pulsating. He gets easily short of breath walking only several yards. He is pain in his neck and both his arms. He feels dizzy. He feels that his heart rate is not well-regulated. He would like [place] to restart his digoxin and diltiazem which he took for 10 years.  He is known to have bilateral severe renal artery stenosis and earlier this year had an episode of acute renal sufficiency and severe hypotension with a fairly protracted hospitalization. After that it seemed that his blood pressure was much lower than before and many antihypertensive agents were discontinued. His renal function recovered at least partly. He has permanent atrial fibrillation with a well-controlled ventricular rate on beta blocker therapy. The beta blockers currently his only antihypertensive medication.  He has severe bilateral arterial disease of the lower extremities and appears to have complete occlusion at the level of the distal superficial femoral arteries. Before his episode of hypotension and renal failure we had planned for him to undergo lower shunting arteriography. This was canceled.  It is very difficult to obtain a accurate blood pressure reading with a sphygmomanometer and spent a scope. I was only able to really obtain a reliable blood pressure by using a Doppler signal of his radial artery and a arm cuff. He was initially recorded as having a blood pressure of about 98/60. When I rechecked his blood pressure his systolic was 295 mm Hg. I could not determine his diastolic blood pressure with the Doppler signal. He does not have reliable Korotkoff sounds at the brachial artery. Using the Doppler signal I found  identical pressures in the right and left upper extremities almost 300 mm Hg    Allergies  Allergen Reactions  . Percocet [Oxycodone-Acetaminophen] Other (See Comments)    "almost a heart attack"    Current Outpatient Prescriptions  Medication Sig Dispense Refill  . ALPRAZolam (XANAX) 0.5 MG tablet Take 0.5 mg by mouth as needed for anxiety.      Marland Kitchen aspirin EC 81 MG EC tablet Take 1 tablet (81 mg total) by mouth daily.  30 tablet  0  . glipiZIDE (GLUCOTROL) 2.5 mg TABS Take 0.5 tablets (2.5 mg total) by mouth daily before breakfast.  30 tablet  0  . metoprolol tartrate (LOPRESSOR) 25 MG tablet Take 0.5 tablets (12.5 mg total) by mouth 2 (two) times daily.  30 tablet  6  . Nutritional Supplements (ENSURE PO) Take 1 Can by mouth 2 (two) times daily.       Marland Kitchen warfarin (COUMADIN) 5 MG tablet Take 2.5-5 mg by mouth See admin instructions. 1 tablet (5 mg) on Monday, Wednesday, Friday, 1/2 tablet (2.5 mg) on all other days      . diltiazem (CARDIZEM CD) 180 MG 24 hr capsule Take 1 capsule (180 mg total) by mouth daily.  90 capsule  3   No current facility-administered medications for this visit.    Past Medical History  Diagnosis Date  . Permanent atrial fibrillation   . Hypertension   . HNP (herniated nucleus pulposus), lumbar   . PVD (peripheral vascular disease)   . Diabetes mellitus   . CAD (coronary artery disease)     s/p CABG x 4  .  Ventricular hypertrophy   . Ischemic colitis   . Gastroenteritis, noninfectious   . Rectal bleeding     presumed secondary to ischemic colitis  . Depression     Past Surgical History  Procedure Laterality Date  . Cardiac catheterization  01/2002    40-50% distal LM/ostial LAD disease, 50-60% ostial LAD, 60% mid LAD, 30-40% D1, 70% tubular mid LCx, 70% ostial disease in two ramus intermedius branches, 70% prox RCA, 50-60% mid RCA, 70% distal RCA, LVEF 40%   . Coronary artery bypass graft  01/2002    x 4: LIMA-LAD, SVG-OM, SVG-PDA-PLB  .  Cholecystectomy N/A 09/27/2012    Procedure: LAPAROSCOPIC CHOLECYSTECTOMY WITH INTRAOPERATIVE CHOLANGIOGRAM;  Surgeon: Lodema Pilot, DO;  Location: [facility] OR;  Service: General;  Laterality: N/A;    Family History  Problem Relation Age of Onset  . Heart disease Mother   . Heart disease Sister   . Breast cancer Sister     History   Social History  . Marital Status: Widowed    Spouse Name: N/A    Number of Children: N/A  . Years of Education: N/A   Occupational History  . Not on file.   Social History Main Topics  . Smoking status: Former Smoker -- 1.00 packs/day for 30 years    Types: Cigarettes    Quit date: 02/15/1991  . Smokeless tobacco: Not on file  . Alcohol Use: No  . Drug Use: No  . Sexual Activity: Not on file   Other Topics Concern  . Not on file   Social History Narrative  . No narrative on file    Review of systems: The review of systems is obtained via the translating efforts of his daughter. He denies neurological complaints other than the headache. He is short of breath with activity but not at rest. He does not have any chest pain either at rest or with activity. He does not have urinary problems. It is hard to say whether he has insulin claudication. Shortness of breath is not a limiting factor in his activity. Earlier he had complaints of severe leg pain.  PHYSICAL EXAM BP 98/60  Pulse 64  Ht 5\' 6"  (1.676 m)  Wt 153 lb 11.2 oz (69.718 kg)  BMI 24.82 kg/m2 The real systolic blood pressure is around 295 mm Hg General: Alert, oriented x3, no distress HEENT: Pupils are equal round react to light accommodation extraocular movements are intact. Moist mucous membranes Neck: Flat JVP, bilateral carotid bruits No cervical lymphadenopathy.  Cardiac: Irregularly irregular without murmur rub or gallop, widely split second heart sound  Lungs: clear to auscultation bilaterally, no rhonchi right lower lobe cleared with cough  Abd: soft, nontender, positive bowel  sounds all quadrants, no hepatosplenomegaly  Ext: no lower extremity edema. 2+ left radial 1+ right radial and 1+ right dorsalis pedis pulses. No palpable left pedal pulses  Skin: warm and dry, no rashes  Neuro: Grossly normal, strength 4/5 equal upper and lower extremities  EKG: Atrial fibrillation  Lipid Panel     Component Value Date/Time   CHOL 145 09/25/2012 0656   TRIG 86 09/25/2012 0656   HDL 39* 09/25/2012 0656   CHOLHDL 3.7 09/25/2012 0656   VLDL 17 09/25/2012 0656   LDLCALC 89 09/25/2012 0656    BMET    Component Value Date/Time   NA 132* 12/13/2012 0835   K 4.5 12/13/2012 0835   CL 101 12/13/2012 0835   CO2 24 12/13/2012 0835   GLUCOSE 211* 12/13/2012 4098  BUN 29* 12/13/2012 0835   CREATININE 1.67* 12/13/2012 0835   CREATININE 1.41* 12/02/2012 2210   CALCIUM 8.8 12/13/2012 0835   GFRNONAA 46* 12/02/2012 2210   GFRAA 54* 12/02/2012 2210     ASSESSMENT AND PLAN Renal artery stenosis, native, bilateral Retrospectively I think Mr. Mayhall had ACE inhibitor induced severe hypotension and renal failure in the setting of bilateral renal artery stenosis (and possibly hypovolemia) earlier this year. His hypertension was protracted but not permanent. He is now severely hypertensive secondary to renal artery stenosis. We need to bring his blood pressure down gradually to avoid complications. I suspect that his intermittent claudication has resolved because of the severe elevation in blood pressure. High blood pressure also probably explains his headache and his shortness of breath. We will start diltiazem 180 mg daily since he seems to be very much attached to this medication and importantly to begin it. We'll have to watch for excessive bradycardia when combined with metoprolol. I would not resume his digoxin them out much she asks me since I am concerned about toxicity in the setting of moderate renal insufficiency. Also completely avoid use of ACE inhibitors, angiotensin receptor blockers and  even aldosterone antagonists to avoid the risk of recurrent hypotension and acute renal failure. Potential agents to consider hydralazine and low doses of diuretic. If his blood pressure cannot be readily controlled with these interventions we might have to again approached the idea of renal artery angiography and possible stenting.  PAD (peripheral artery disease)    Long term (current) use of anticoagulants Followed in our Coumadin clinic. He'll come back in 2 weeks the Coumadin clinic and will have an opportunity to check his blood pressure at that time  Atrial fibrillation Permanent atrial fibrillation. Well rate controlled.  IMPORTANT: great care has to be taken to obtain an accurate blood pressure readings.  ADVISE using a radial artery Doppler signal  Orders Placed This Encounter  Procedures  . Basic Metabolic Panel (BMET)   Meds ordered this encounter  Medications  . diltiazem (CARDIZEM CD) 180 MG 24 hr capsule    Sig: Take 1 capsule (180 mg total) by mouth daily.    Dispense:  90 capsule    Refill:  3    Allard Lightsey  Thurmon Fair, MD, [facility] [phone] office [phone] pager

## 2013-03-14 NOTE — Assessment & Plan Note (Signed)
Followed in our Coumadin clinic. He'll come back in 2 weeks the Coumadin clinic and will have an opportunity to check his blood pressure at that time

## 2013-03-14 NOTE — Assessment & Plan Note (Addendum)
Retrospectively I think [name] had ACE inhibitor induced severe hypotension and renal failure in the setting of bilateral renal artery stenosis (and possibly hypovolemia) earlier this year. His hypertension was protracted but not permanent. He is now severely hypertensive secondary to renal artery stenosis. We need to bring his blood pressure down gradually to avoid complications. I suspect that his intermittent claudication has resolved because of the severe elevation in blood pressure. High blood pressure also probably explains his headache and his shortness of breath. We will start diltiazem 180 mg daily since he seems to be very much attached to this medication and importantly to begin it. We'll have to watch for excessive bradycardia when combined with metoprolol. I would not resume his digoxin them out much she asks me since I am concerned about toxicity in the setting of moderate renal insufficiency. Also completely avoid use of ACE inhibitors, angiotensin receptor blockers and even aldosterone antagonists to avoid the risk of recurrent hypotension and acute renal failure. Potential agents to consider hydralazine and low doses of diuretic. If his blood pressure cannot be readily controlled with these interventions we might have to again approached the idea of renal artery angiography and possible stenting.

## 2013-03-14 NOTE — Assessment & Plan Note (Signed)
Permanent atrial fibrillation. Well rate controlled.

## 2013-03-15 ENCOUNTER — Telehealth: Payer: Self-pay | Admitting: Cardiovascular Disease

## 2013-03-15 DIAGNOSIS — I4891 Unspecified atrial fibrillation: Secondary | ICD-10-CM

## 2013-03-15 MED ORDER — AMLODIPINE BESYLATE 10 MG PO TABS: 10.0000 mg | ORAL_TABLET | Freq: Every day | ORAL | Status: DC

## 2013-03-15 NOTE — Telephone Encounter (Signed)
Returned call to [place], pt's daughter.  Stated pt needs PA for the new medication Cardizem.  Please call [phone].  Daughter also asking if pt can have 120 mg instead of 180 mg.  Informed Dr. Salena Saner will be notified of request.  Also informed she will be notified of response from insurance company.  Verbalized understanding.  Message forwarded to Dr. Royann Shivers r/t dose change request.  Message forwarded to [facility], CMA r/t PA for Cardizem.

## 2013-03-15 NOTE — Telephone Encounter (Signed)
[name] is calling again regarding her father's medication. She would like to know what is going on and whether or not they need an appointment. I told [name] that she will receive a phone call back regarding this.

## 2013-03-15 NOTE — Telephone Encounter (Signed)
[name] called asking if someone could call her back about her father's new medication Cardizem.

## 2013-03-15 NOTE — Telephone Encounter (Signed)
Let's try amlodipine 10 mg once daily. Please ask his daughter [name] to explain to him that this is equivalent to the diltiazem that he would like prescribed.

## 2013-03-15 NOTE — Telephone Encounter (Signed)
Informed dgtr that [facility] changed from Dilt to the equivalent of Amlodipine. Encouraged pt to call back with questions.

## 2013-03-18 ENCOUNTER — Telehealth: Payer: Self-pay | Admitting: *Deleted

## 2013-03-21 NOTE — Telephone Encounter (Signed)
Started Amlodipine on Saturday, per dgtr patient hasn't been feeling well since the start of the medicine. She stated that the patient stays in bed most of the day, and c/o leg pains and dizziness. Dgtr took  BP yesterday, and stated that it was high. No c/o SOB, or CP. Patient currently does not have an upcoming appointment scheduled, but his daughter feels that he needs to come back in to see [facility]. Will defer scheduling to [facility].

## 2013-03-25 ENCOUNTER — Ambulatory Visit (INDEPENDENT_AMBULATORY_CARE_PROVIDER_SITE_OTHER): Payer: Medicaid Other | Admitting: Cardiovascular Disease

## 2013-03-25 ENCOUNTER — Ambulatory Visit (INDEPENDENT_AMBULATORY_CARE_PROVIDER_SITE_OTHER): Payer: Medicaid Other | Admitting: Pharmacist Clinician (PhC)/ Clinical Pharmacy Specialist

## 2013-03-25 VITALS — BP 182/108 | HR 68 | Resp 16 | Ht 65.0 in | Wt 151.9 lb

## 2013-03-25 DIAGNOSIS — I701 Atherosclerosis of renal artery: Secondary | ICD-10-CM

## 2013-03-25 DIAGNOSIS — E785 Hyperlipidemia, unspecified: Secondary | ICD-10-CM

## 2013-03-25 DIAGNOSIS — I4891 Unspecified atrial fibrillation: Secondary | ICD-10-CM

## 2013-03-25 DIAGNOSIS — I4510 Unspecified right bundle-branch block: Secondary | ICD-10-CM

## 2013-03-25 DIAGNOSIS — I2510 Atherosclerotic heart disease of native coronary artery without angina pectoris: Secondary | ICD-10-CM

## 2013-03-25 DIAGNOSIS — Z7901 Long term (current) use of anticoagulants: Secondary | ICD-10-CM

## 2013-03-25 DIAGNOSIS — I4821 Permanent atrial fibrillation: Secondary | ICD-10-CM

## 2013-03-25 MED ORDER — DILTIAZEM HCL 60 MG PO TABS: 30.0000 mg | ORAL_TABLET | Freq: Two times a day (BID) | ORAL | Status: DC

## 2013-03-25 NOTE — Patient Instructions (Addendum)
Stop Amlodipine.  Start  Diltiazem 60mg  1/2 tablet twice a day.  Your physician recommends that you schedule a follow-up appointment in: 6 weeks.

## 2013-03-26 ENCOUNTER — Encounter: Payer: Self-pay | Admitting: Cardiovascular Disease

## 2013-03-26 NOTE — Progress Notes (Signed)
Patient ID: [name], male   DOB: 1932-04-25, 79 y.o.   MRN: [id number]     Reason for office visit Severe hypertension, severe PVD, CAD status post CABG, bilateral renal artery stenosis, atrial fibrillation  The patient has extensive PAD including bilateral femoral artery occlusion with intermittent claudication with minimal exertion. His blood pressure was severely elevated at his last appointment. Many of his antihypertensive medications had been stopped when he developed renal insufficiency and shock earlier in the year. He was treated for years with ACE inhibitors and diltiazem with good control but about 6 months ago appeared to have ACE inhibitor related acute renal failure, possibly due to progression of bilateral renal artery stenosis. He has been feeling poorly well on monotherapy with metoprolol and when we tried to add amlodipine he developed nausea anorexia and leg pain. His blood pressure was not checked on those days. He insists on going back on the diltiazem which he tolerated well for years. Unfortunately insurance has denied coverage for sustained-release diltiazem.    Allergies  Allergen Reactions  . Percocet [Oxycodone-Acetaminophen] Other (See Comments)    "almost a heart attack"    Current Outpatient Prescriptions  Medication Sig Dispense Refill  . ALPRAZolam (XANAX) 0.5 MG tablet Take 0.5 mg by mouth as needed for anxiety.      Marland Kitchen aspirin EC 81 MG EC tablet Take 1 tablet (81 mg total) by mouth daily.  30 tablet  0  . glipiZIDE (GLUCOTROL) 2.5 mg TABS Take 0.5 tablets (2.5 mg total) by mouth daily before breakfast.  30 tablet  0  . metoprolol tartrate (LOPRESSOR) 25 MG tablet Take 12.5 mg by mouth 2 (two) times daily. Take 25mg  QAM and 12.5mg  QPM.      . Nutritional Supplements (ENSURE PO) Take 1 Can by mouth 2 (two) times daily.       Marland Kitchen warfarin (COUMADIN) 5 MG tablet Take 2.5-5 mg by mouth See admin instructions. 1 tablet (5 mg) on Monday, Wednesday, Friday, 1/2  tablet (2.5 mg) on all other days      . diltiazem (CARDIZEM) 60 MG tablet Take 0.5 tablets (30 mg total) by mouth 2 (two) times daily.  30 tablet  5   No current facility-administered medications for this visit.    Past Medical History  Diagnosis Date  . Permanent atrial fibrillation   . Hypertension   . HNP (herniated nucleus pulposus), lumbar   . PVD (peripheral vascular disease)   . Diabetes mellitus   . CAD (coronary artery disease)     s/p CABG x 4  . Ventricular hypertrophy   . Ischemic colitis   . Gastroenteritis, noninfectious   . Rectal bleeding     presumed secondary to ischemic colitis  . Depression     Past Surgical History  Procedure Laterality Date  . Cardiac catheterization  01/2002    40-50% distal LM/ostial LAD disease, 50-60% ostial LAD, 60% mid LAD, 30-40% D1, 70% tubular mid LCx, 70% ostial disease in two ramus intermedius branches, 70% prox RCA, 50-60% mid RCA, 70% distal RCA, LVEF 40%   . Coronary artery bypass graft  01/2002    x 4: LIMA-LAD, SVG-OM, SVG-PDA-PLB  . Cholecystectomy N/A 09/27/2012    Procedure: LAPAROSCOPIC CHOLECYSTECTOMY WITH INTRAOPERATIVE CHOLANGIOGRAM;  Surgeon: Lodema Pilot, DO;  Location: [facility] OR;  Service: General;  Laterality: N/A;    Family History  Problem Relation Age of Onset  . Heart disease Mother   . Heart disease Sister   . Breast cancer  Sister     History   Social History  . Marital Status: Widowed    Spouse Name: N/A    Number of Children: N/A  . Years of Education: N/A   Occupational History  . Not on file.   Social History Main Topics  . Smoking status: Former Smoker -- 1.00 packs/day for 30 years    Types: Cigarettes    Quit date: 02/15/1991  . Smokeless tobacco: Not on file  . Alcohol Use: No  . Drug Use: No  . Sexual Activity: Not on file   Other Topics Concern  . Not on file   Social History Narrative  . No narrative on file    Review of systems: As before the review of systems is obtained  via translation. Both his daughter and granddaughter are here today to help with that. It is fairly clear that while taking amlodipine had worsening leg pain even at rest. This improved when he stopped the medication. He has not had chest pain and denies dyspnea. He has an occasional headache. He he feels that "his heart is working too hard".  PHYSICAL EXAM BP 182/108  Pulse 68  Ht 5\' 5"  (1.651 m)  Wt 151 lb 14.4 oz (68.901 kg)  BMI 25.28 kg/m2 Alert, oriented x3, no distress  HEENT: Pupils are equal round react to light accommodation extraocular movements are intact. Moist mucous membranes  Neck: Flat JVP, bilateral carotid bruits No cervical lymphadenopathy.  Cardiac: Irregularly irregular without murmur rub or gallop, widely split second heart sound  Lungs: clear to auscultation bilaterally, no rhonchi right lower lobe cleared with cough  Abd: soft, nontender, positive bowel sounds all quadrants, no hepatosplenomegaly  Ext: no lower extremity edema. 2+ left radial 1+ right radial and 1+ right dorsalis pedis pulses. No palpable left pedal pulses  Skin: warm and dry, no rashes  Neuro: Grossly normal, strength 4/5 equal upper and lower extremities  Lipid Panel     Component Value Date/Time   CHOL 145 09/25/2012 0656   TRIG 86 09/25/2012 0656   HDL 39* 09/25/2012 0656   CHOLHDL 3.7 09/25/2012 0656   VLDL 17 09/25/2012 0656   LDLCALC 89 09/25/2012 0656    BMET    Component Value Date/Time   NA 132* 12/13/2012 0835   K 4.5 12/13/2012 0835   CL 101 12/13/2012 0835   CO2 24 12/13/2012 0835   GLUCOSE 211* 12/13/2012 0835   BUN 29* 12/13/2012 0835   CREATININE 1.67* 12/13/2012 0835   CREATININE 1.41* 12/02/2012 2210   CALCIUM 8.8 12/13/2012 0835   GFRNONAA 46* 12/02/2012 2210   GFRAA 54* 12/02/2012 2210     ASSESSMENT AND PLAN  The presence of multiple areas of arthrosclerotic disease makes it hard to manage this gentleman. He appears to require a fairly high blood pressures to prevent resting limb  pain. He does not have angina pectoris. His ventricular rate control seems to be fair on the current medications.  He really wants to take the diltiazem which did well for him. We'll prescribe a low dose of immediate release diltiazem twice daily and slowly escalate as permitted by his blood pressure and leg symptoms. We will have to see him on a frequent basis in followup.  Meds ordered this encounter  Medications  . metoprolol tartrate (LOPRESSOR) 25 MG tablet    Sig: Take 12.5 mg by mouth 2 (two) times daily. Take 25mg  QAM and 12.5mg  QPM.  . diltiazem (CARDIZEM) 60 MG tablet  Sig: Take 0.5 tablets (30 mg total) by mouth 2 (two) times daily.    Dispense:  30 tablet    Refill:  5    Aarica Wax  Thurmon Fair, MD, [facility] [phone] office [phone] pager

## 2013-03-28 ENCOUNTER — Ambulatory Visit: Payer: Medicaid Other | Admitting: Pharmacist Clinician (PhC)/ Clinical Pharmacy Specialist

## 2013-04-08 ENCOUNTER — Telehealth: Payer: Self-pay | Admitting: *Deleted

## 2013-04-08 ENCOUNTER — Ambulatory Visit: Payer: Medicaid Other | Admitting: Pharmacist Clinician (PhC)/ Clinical Pharmacy Specialist

## 2013-04-08 ENCOUNTER — Ambulatory Visit (INDEPENDENT_AMBULATORY_CARE_PROVIDER_SITE_OTHER): Payer: Medicaid Other | Admitting: Pharmacist Clinician (PhC)/ Clinical Pharmacy Specialist

## 2013-04-08 DIAGNOSIS — I4891 Unspecified atrial fibrillation: Secondary | ICD-10-CM

## 2013-04-08 DIAGNOSIS — Z7901 Long term (current) use of anticoagulants: Secondary | ICD-10-CM

## 2013-04-08 LAB — POCT INR: INR: 2.3

## 2013-04-08 NOTE — Telephone Encounter (Signed)
Appears old prior auth was faxed by pharmacy.  Patient currently presc. Diltiazem 60mg  1/2 bid and metoprolol tart 25mg  which patient has already picked up.

## 2013-04-08 NOTE — Telephone Encounter (Signed)
Received prior auth. For Diltiazem ER 180mg  (which has been changed to Diltiazem 60mg  1/2 bid) and Meotprolol Tart 25mg .  Per [place] [place] Medicaid pharmacy has to do an override on meds they are both approved. [facility] will be called when they open at 9am to be advised.

## 2013-04-09 LAB — BASIC METABOLIC PANEL
BUN: 27 mg/dL — ABNORMAL HIGH (ref 6–23)
CO2: 31 mEq/L (ref 19–32)
Chloride: 98 mEq/L (ref 96–112)
Creat: 1.28 mg/dL (ref 0.50–1.35)
Potassium: 4.4 mEq/L (ref 3.5–5.3)

## 2013-05-07 ENCOUNTER — Ambulatory Visit: Payer: Medicaid Other | Admitting: Cardiovascular Disease

## 2013-05-10 ENCOUNTER — Encounter: Payer: Self-pay | Admitting: Cardiovascular Disease

## 2013-05-10 ENCOUNTER — Ambulatory Visit (INDEPENDENT_AMBULATORY_CARE_PROVIDER_SITE_OTHER): Payer: Medicaid Other | Admitting: Cardiovascular Disease

## 2013-05-10 ENCOUNTER — Ambulatory Visit (INDEPENDENT_AMBULATORY_CARE_PROVIDER_SITE_OTHER): Payer: Medicaid Other | Admitting: Pharmacist Clinician (PhC)/ Clinical Pharmacy Specialist

## 2013-05-10 VITALS — BP 200/94 | HR 76 | Ht 66.0 in | Wt 153.9 lb

## 2013-05-10 DIAGNOSIS — I701 Atherosclerosis of renal artery: Secondary | ICD-10-CM

## 2013-05-10 DIAGNOSIS — I4891 Unspecified atrial fibrillation: Secondary | ICD-10-CM

## 2013-05-10 DIAGNOSIS — Z7901 Long term (current) use of anticoagulants: Secondary | ICD-10-CM

## 2013-05-10 DIAGNOSIS — E1165 Type 2 diabetes mellitus with hyperglycemia: Secondary | ICD-10-CM

## 2013-05-10 DIAGNOSIS — I2510 Atherosclerotic heart disease of native coronary artery without angina pectoris: Secondary | ICD-10-CM

## 2013-05-10 DIAGNOSIS — I150 Renovascular hypertension: Secondary | ICD-10-CM

## 2013-05-10 DIAGNOSIS — I7389 Other specified peripheral vascular diseases: Secondary | ICD-10-CM

## 2013-05-10 LAB — POCT INR: INR: 3.2

## 2013-05-10 MED ORDER — DILTIAZEM HCL 60 MG PO TABS: 60.0000 mg | ORAL_TABLET | Freq: Two times a day (BID) | ORAL | Status: AC

## 2013-05-10 NOTE — Patient Instructions (Signed)
Increase Diltiazem to 60mg  twice a day.  Take Warfarin 5mg  daily except 1/2 tablet Fridays and Mondays.  Your physician recommends that you schedule a follow-up appointment in: 6 months.

## 2013-05-11 NOTE — Assessment & Plan Note (Signed)
It has consistently been a problem to accurately measure [name] blood pressure with a sphygmomanometer. The only way to accurately check his blood pressures using a Doppler recording of his radial pulse. I think his target systolic blood pressure should be around 150-160. More aggressive blood pressure control is associated with worsening leg pain and unsteady gait

## 2013-05-11 NOTE — Progress Notes (Signed)
Patient ID: [name], male   DOB: January 28, 1932, 79 y.o.   MRN: [id number]      Reason for office visit Atrial fibrillation, peripheral arterial disease, coronary arterial disease  Mr. Liberatore seems to finally be feeling well. He no longer complains of unsteady balance, leg pain, pulsations in his head, all of which were complaints before we started treatment with diltiazem. He insists on taking diltiazem ordered from [place] (he has a box of diltiazem 60 mg immediate release tablets which has been taking once daily). He refuses to take the diltiazem from the [place] pharmacies because it is the wrong color (orange instead of white). Has not had any neurological complaints or bleeding problems. His INR today is elevated at 3.4.   Allergies  Allergen Reactions  . Percocet [Oxycodone-Acetaminophen] Other (See Comments)    "almost a heart attack"    Current Outpatient Prescriptions  Medication Sig Dispense Refill  . ALPRAZolam (XANAX) 0.5 MG tablet Take 0.5 mg by mouth as needed for anxiety.      Marland Kitchen aspirin EC 81 MG EC tablet Take 1 tablet (81 mg total) by mouth daily.  30 tablet  0  . diltiazem (CARDIZEM) 60 MG tablet Take 1 tablet (60 mg total) by mouth 2 (two) times daily.  60 tablet  5  . glipiZIDE (GLUCOTROL) 2.5 mg TABS Take 0.5 tablets (2.5 mg total) by mouth daily before breakfast.  30 tablet  0  . metoprolol tartrate (LOPRESSOR) 25 MG tablet Take 12.5 mg by mouth 2 (two) times daily. Take 25mg  QAM and 12.5mg  QPM.      . Nutritional Supplements (ENSURE PO) Take 1 Can by mouth 2 (two) times daily.       Marland Kitchen warfarin (COUMADIN) 5 MG tablet Take 2.5-5 mg by mouth See admin instructions. 1 tablet daily except 1/2 tablet Sundays.       No current facility-administered medications for this visit.    Past Medical History  Diagnosis Date  . Permanent atrial fibrillation   . Hypertension   . HNP (herniated nucleus pulposus), lumbar   . PVD (peripheral vascular disease)   . Diabetes  mellitus   . CAD (coronary artery disease)     s/p CABG x 4  . Ventricular hypertrophy   . Ischemic colitis   . Gastroenteritis, noninfectious   . Rectal bleeding     presumed secondary to ischemic colitis  . Depression     Past Surgical History  Procedure Laterality Date  . Cardiac catheterization  01/2002    40-50% distal LM/ostial LAD disease, 50-60% ostial LAD, 60% mid LAD, 30-40% D1, 70% tubular mid LCx, 70% ostial disease in two ramus intermedius branches, 70% prox RCA, 50-60% mid RCA, 70% distal RCA, LVEF 40%   . Coronary artery bypass graft  01/2002    x 4: LIMA-LAD, SVG-OM, SVG-PDA-PLB  . Cholecystectomy N/A 09/27/2012    Procedure: LAPAROSCOPIC CHOLECYSTECTOMY WITH INTRAOPERATIVE CHOLANGIOGRAM;  Surgeon: Lodema Pilot, DO;  Location: [facility] OR;  Service: General;  Laterality: N/A;    Family History  Problem Relation Age of Onset  . Heart disease Mother   . Heart disease Sister   . Breast cancer Sister     History   Social History  . Marital Status: Widowed    Spouse Name: N/A    Number of Children: N/A  . Years of Education: N/A   Occupational History  . Not on file.   Social History Main Topics  . Smoking status: Former Smoker -- 1.00 packs/day  for 30 years    Types: Cigarettes    Quit date: 02/15/1991  . Smokeless tobacco: Not on file  . Alcohol Use: No  . Drug Use: No  . Sexual Activity: Not on file   Other Topics Concern  . Not on file   Social History Narrative  . No narrative on file    Review of systems: The patient specifically denies any chest pain at rest or with exertion, dyspnea at rest or with exertion, orthopnea, paroxysmal nocturnal dyspnea, syncope, palpitations, focal neurological deficits, intermittent claudication, lower extremity edema, unexplained weight gain, cough, hemoptysis or wheezing.  The patient also denies abdominal pain, nausea, vomiting, dysphagia, diarrhea, constipation, polyuria, polydipsia, dysuria, hematuria, frequency,  urgency, abnormal bleeding or bruising, fever, chills, unexpected weight changes, mood swings, change in skin or hair texture, change in voice quality, auditory or visual problems, allergic reactions or rashes, new musculoskeletal complaints other than usual "aches and pains".   PHYSICAL EXAM BP 200/94  Pulse 76  Ht 5\' 6"  (1.676 m)  Wt 153 lb 14.4 oz (69.809 kg)  BMI 24.85 kg/m2 HEENT: Pupils are equal round react to light accommodation extraocular movements are intact. Moist mucous membranes  Neck: Flat JVP, bilateral carotid bruits No cervical lymphadenopathy.  Cardiac: Irregularly irregular without murmur rub or gallop, widely split second heart sound  Lungs: clear to auscultation bilaterally, no rhonchi right lower lobe cleared with cough  Abd: soft, nontender, positive bowel sounds all quadrants, no hepatosplenomegaly  Ext: no lower extremity edema. 2+ left radial 1+ right radial and 1+ right dorsalis pedis pulses. No palpable left pedal pulses  Skin: warm and dry, no rashes  Neuro: Grossly normal, strength 4/5 equal upper and lower extremities   EKG: Atrial fibrillation, otherwise normal  Lipid Panel     Component Value Date/Time   CHOL 145 09/25/2012 0656   TRIG 86 09/25/2012 0656   HDL 39* 09/25/2012 0656   CHOLHDL 3.7 09/25/2012 0656   VLDL 17 09/25/2012 0656   LDLCALC 89 09/25/2012 0656    BMET    Component Value Date/Time   NA 139 04/08/2013 1341   K 4.4 04/08/2013 1341   CL 98 04/08/2013 1341   CO2 31 04/08/2013 1341   GLUCOSE 197* 04/08/2013 1341   BUN 27* 04/08/2013 1341   CREATININE 1.28 04/08/2013 1341   CREATININE 1.41* 12/02/2012 2210   CALCIUM 9.7 04/08/2013 1341   GFRNONAA 46* 12/02/2012 2210   GFRAA 54* 12/02/2012 2210     ASSESSMENT AND PLAN CAD, CABG X 4 2003. Myoview low risk 3/14 Currently no symptoms of active coronary insufficiency. Remarkably good lipid profile. Maj. uncontrolled wrist fracture remains his blood pressure which is difficult to assess and  manage  PVD WITH CLAUDICATION- he was going to be set up for OP PVA His blood pressure seems to be a significant part of his claudication problems he has severe bilateral superficial femoral artery occlusive disease with now has no complaints of claudication whatsoever. When his blood pressure was "normal" he had intermittent claudication as well as rest pain. I suspect we need to let his blood pressure "ride high" to prevent symptoms of arterial insufficiency.  Atrial fibrillation Good rate control right now, but the immediate release diltiazem should be taken twice daily. This should also help reduce his severely elevated blood pressure. His INR is slightly supratherapeutic and appropriate changes were made to his warfarin dosage today.  Renal artery stenosis, native, bilateral It is likely that treatment with renin-angiotensin blockade agents  with 2 acute renal insufficiency and severe hypotension, complicated by ischemic colitis earlier this year. Avoid these agents.  Renovascular hypertension It has consistently been a problem to accurately measure Mr. Monical blood pressure with a sphygmomanometer. The only way to accurately check his blood pressures using a Doppler recording of his radial pulse. I think his target systolic blood pressure should be around 150-160. More aggressive blood pressure control is associated with worsening leg pain and unsteady gait  Diabetes mellitus type 2, uncontrolled We should reassess his hemoglobin A1c in the near future. It seems that he has lost a lot of weight over the last year, but his appetite has now improved.   Increase Diltiazem to 60mg  twice a day.  Take Warfarin 5mg  daily except 1/2 tablet Fridays and Mondays.  Your physician recommends that you schedule a follow-up appointment in: 6 months.  Meds ordered this encounter  Medications  . diltiazem (CARDIZEM) 60 MG tablet    Sig: Take 1 tablet (60 mg total) by mouth 2 (two) times daily.     Dispense:  60 tablet    Refill:  5    Willoughby Doell  Thurmon Fair, MD, [facility] [phone] office [phone] pager

## 2013-05-11 NOTE — Assessment & Plan Note (Signed)
We should reassess his hemoglobin A1c in the near future. It seems that he has lost a lot of weight over the last year, but his appetite has now improved.

## 2013-05-11 NOTE — Assessment & Plan Note (Signed)
His blood pressure seems to be a significant part of his claudication problems he has severe bilateral superficial femoral artery occlusive disease with now has no complaints of claudication whatsoever. When his blood pressure was "normal" he had intermittent claudication as well as rest pain. I suspect we need to let his blood pressure "ride high" to prevent symptoms of arterial insufficiency.

## 2013-05-11 NOTE — Assessment & Plan Note (Signed)
It is likely that treatment with renin-angiotensin blockade agents with 2 acute renal insufficiency and severe hypotension, complicated by ischemic colitis earlier this year. Avoid these agents.

## 2013-05-11 NOTE — Assessment & Plan Note (Signed)
Currently no symptoms of active coronary insufficiency. Remarkably good lipid profile. Maj. uncontrolled wrist fracture remains his blood pressure which is difficult to assess and manage

## 2013-05-11 NOTE — Assessment & Plan Note (Signed)
Good rate control right now, but the immediate release diltiazem should be taken twice daily. This should also help reduce his severely elevated blood pressure. His INR is slightly supratherapeutic and appropriate changes were made to his warfarin dosage today.

## 2013-05-15 ENCOUNTER — Ambulatory Visit: Payer: Medicaid Other | Admitting: Pharmacist Clinician (PhC)/ Clinical Pharmacy Specialist

## 2013-05-21 ENCOUNTER — Other Ambulatory Visit: Payer: Self-pay | Admitting: Pharmacist Clinician (PhC)/ Clinical Pharmacy Specialist

## 2013-05-21 MED ORDER — WARFARIN SODIUM 5 MG PO TABS: ORAL_TABLET | ORAL | Status: DC

## 2013-06-10 ENCOUNTER — Other Ambulatory Visit: Payer: Self-pay

## 2013-06-10 ENCOUNTER — Other Ambulatory Visit: Payer: Self-pay | Admitting: *Deleted

## 2013-06-10 MED ORDER — METOPROLOL TARTRATE 25 MG PO TABS: ORAL_TABLET | ORAL | Status: DC

## 2013-06-10 NOTE — Telephone Encounter (Signed)
Rx was sent to pharmacy electronically. 

## 2013-06-13 ENCOUNTER — Ambulatory Visit (INDEPENDENT_AMBULATORY_CARE_PROVIDER_SITE_OTHER): Payer: Medicaid Other | Admitting: Pharmacist Clinician (PhC)/ Clinical Pharmacy Specialist

## 2013-06-13 VITALS — BP 260/180 | HR 84

## 2013-06-13 DIAGNOSIS — Z7901 Long term (current) use of anticoagulants: Secondary | ICD-10-CM

## 2013-06-13 DIAGNOSIS — I4891 Unspecified atrial fibrillation: Secondary | ICD-10-CM

## 2013-07-08 ENCOUNTER — Other Ambulatory Visit: Payer: Self-pay | Admitting: *Deleted

## 2013-07-08 NOTE — Telephone Encounter (Signed)
Refill for glipizide 2.5mg  QD refused.. Defer to PCP

## 2013-07-17 ENCOUNTER — Ambulatory Visit (INDEPENDENT_AMBULATORY_CARE_PROVIDER_SITE_OTHER): Payer: Medicaid Other | Admitting: Pharmacist Clinician (PhC)/ Clinical Pharmacy Specialist

## 2013-07-17 VITALS — BP 300/200

## 2013-07-17 DIAGNOSIS — Z7901 Long term (current) use of anticoagulants: Secondary | ICD-10-CM

## 2013-07-17 DIAGNOSIS — I4891 Unspecified atrial fibrillation: Secondary | ICD-10-CM

## 2013-07-17 LAB — POCT INR: INR: 2.2

## 2013-08-14 ENCOUNTER — Ambulatory Visit: Payer: Medicaid Other | Admitting: Pharmacist Clinician (PhC)/ Clinical Pharmacy Specialist

## 2013-09-20 ENCOUNTER — Telehealth: Payer: Self-pay | Admitting: *Deleted

## 2013-09-20 ENCOUNTER — Encounter: Payer: Self-pay | Admitting: Cardiovascular Disease

## 2013-09-20 ENCOUNTER — Ambulatory Visit (INDEPENDENT_AMBULATORY_CARE_PROVIDER_SITE_OTHER): Payer: Medicaid Other | Admitting: Pharmacist Clinician (PhC)/ Clinical Pharmacy Specialist

## 2013-09-20 VITALS — BP 280/176

## 2013-09-20 DIAGNOSIS — Z7901 Long term (current) use of anticoagulants: Secondary | ICD-10-CM

## 2013-09-20 DIAGNOSIS — I4891 Unspecified atrial fibrillation: Secondary | ICD-10-CM

## 2013-09-20 LAB — POCT INR: INR: 2.3

## 2013-09-20 NOTE — Telephone Encounter (Signed)
Letter sent to [facility] for work/school absences in the care of her father via e-mail - [email or web address].

## 2013-10-21 ENCOUNTER — Ambulatory Visit: Payer: Medicaid Other | Admitting: Pharmacist Clinician (PhC)/ Clinical Pharmacy Specialist

## 2013-10-21 ENCOUNTER — Ambulatory Visit: Payer: Medicaid Other | Admitting: Cardiovascular Disease

## 2013-10-21 ENCOUNTER — Ambulatory Visit (INDEPENDENT_AMBULATORY_CARE_PROVIDER_SITE_OTHER): Payer: Medicaid Other | Admitting: Pharmacist Clinician (PhC)/ Clinical Pharmacy Specialist

## 2013-10-21 ENCOUNTER — Ambulatory Visit: Payer: Medicaid Other | Admitting: Physician Assistant

## 2013-10-21 ENCOUNTER — Ambulatory Visit (INDEPENDENT_AMBULATORY_CARE_PROVIDER_SITE_OTHER): Payer: Medicaid Other | Admitting: Physician Assistant

## 2013-10-21 ENCOUNTER — Encounter: Payer: Self-pay | Admitting: Physician Assistant

## 2013-10-21 VITALS — BP 140/90 | HR 73 | Ht 68.0 in | Wt 153.0 lb

## 2013-10-21 DIAGNOSIS — R079 Chest pain, unspecified: Secondary | ICD-10-CM

## 2013-10-21 DIAGNOSIS — I4891 Unspecified atrial fibrillation: Secondary | ICD-10-CM

## 2013-10-21 DIAGNOSIS — Z7901 Long term (current) use of anticoagulants: Secondary | ICD-10-CM

## 2013-10-21 DIAGNOSIS — I959 Hypotension, unspecified: Secondary | ICD-10-CM

## 2013-10-21 LAB — POCT INR: INR: 2.3

## 2013-10-21 NOTE — Assessment & Plan Note (Signed)
Rate is currently well controlled. He probably has stopped taking his Lopressor and I'm unsure if he is actually taking the diltiazem.  I asked him to reschedule an appointment in approximately one month get an interpreter.

## 2013-10-21 NOTE — Progress Notes (Signed)
Date:  10/21/2013   ID:  Leafy Half, DOB 02-09-1932, MRN [id number]  PCP:  Andria Frames, MD  Primary Cardiologist:  [facility]     History of Present Illness: [name] is a 80 y.o. male with history of permanent atrial fibrillation, hypertension, peripheral vascular disease, diabetes mellitus, coronary artery disease, depression. He is status post coronary artery bypass grafting x4. The patient was brought in by his daughter. I entered the room to arguing they continue to argue for the next 5-10 minutes. He apparently did not want her to act as the interpreter. He apparently has stopped taking his antidepressant medication and Lopressor  Review of systems: Unable to obtain  Wt Readings from Last 3 Encounters:  10/21/13 153 lb (69.4 kg)  05/10/13 153 lb 14.4 oz (69.809 kg)  03/25/13 151 lb 14.4 oz (68.901 kg)     Past Medical History  Diagnosis Date  . Permanent atrial fibrillation   . Hypertension   . HNP (herniated nucleus pulposus), lumbar   . PVD (peripheral vascular disease)   . Diabetes mellitus   . CAD (coronary artery disease)     s/p CABG x 4  . Ventricular hypertrophy   . Ischemic colitis   . Gastroenteritis, noninfectious   . Rectal bleeding     presumed secondary to ischemic colitis  . Depression     Current Outpatient Prescriptions  Medication Sig Dispense Refill  . metoprolol tartrate (LOPRESSOR) 25 MG tablet Take 25mg  QAM and 12.5mg  QPM.  45 tablet  5  . Nutritional Supplements (ENSURE PO) Take 1 Can by mouth 2 (two) times daily.       Marland Kitchen warfarin (COUMADIN) 5 MG tablet Take 1 tablet by mouth daily or as directed  30 tablet  3  . ALPRAZolam (XANAX) 0.5 MG tablet Take 0.5 mg by mouth as needed for anxiety.      Marland Kitchen aspirin EC 81 MG EC tablet Take 1 tablet (81 mg total) by mouth daily.  30 tablet  0  . diltiazem (CARDIZEM) 60 MG tablet Take 1 tablet (60 mg total) by mouth 2 (two) times daily.  60 tablet  5  . glipiZIDE (GLUCOTROL) 2.5 mg TABS Take  0.5 tablets (2.5 mg total) by mouth daily before breakfast.  30 tablet  0   No current facility-administered medications for this visit.    Allergies:    Allergies  Allergen Reactions  . Percocet [Oxycodone-Acetaminophen] Other (See Comments)    "almost a heart attack"    Social History:  The patient  reports that he quit smoking about 22 years ago. His smoking use included Cigarettes. He has a 30 pack-year smoking history. He does not have any smokeless tobacco history on file. He reports that he does not drink alcohol or use illicit drugs.   Family history:   Family History  Problem Relation Age of Onset  . Heart disease Mother   . Heart disease Sister   . Breast cancer Sister     ROS:  Please see the history of present illness.  All other systems reviewed and negative.   PHYSICAL EXAM: VS:  BP 140/90  Pulse 73  Ht 5\' 8"  (1.727 m)  Wt 153 lb (69.4 kg)  BMI 23.27 kg/m2 Well nourished, well developed, in no acute distress HEENT: Pupils are equal round react to light accommodation extraocular movements are intact.  Cardiac: Irregular rate and rhythm no murmurs. Lungs:  clear to auscultation bilaterally, no wheezing, rhonchi or rales Ext: no lower  extremity edema.  2+ radial and 1+ dorsalis pedis pulses. Skin: warm and dry Neuro:  Grossly normal  EKG:  Atrial fibrillation with a right bundle branch block rate 73 beats per minute  ASSESSMENT AND PLAN:  Problem List Items Addressed This Visit   Chronic anticoagulation (Chronic)   Chest pain - Primary   Relevant Orders      EKG 12-Lead   Hypotension     Blood pressure is mildly elevated today.    Atrial fibrillation     Rate is currently well controlled. He probably has stopped taking his Lopressor and I'm unsure if he is actually taking the diltiazem.  I asked him to reschedule an appointment in approximately one month get an interpreter.

## 2013-10-21 NOTE — Patient Instructions (Signed)
1.  At next appt.

## 2013-10-21 NOTE — Assessment & Plan Note (Signed)
Blood pressure is mildly elevated today

## 2013-10-22 ENCOUNTER — Other Ambulatory Visit: Payer: Self-pay | Admitting: Pharmacist Clinician (PhC)/ Clinical Pharmacy Specialist

## 2013-11-22 ENCOUNTER — Telehealth: Payer: Self-pay | Admitting: Cardiovascular Disease

## 2013-11-22 ENCOUNTER — Ambulatory Visit (INDEPENDENT_AMBULATORY_CARE_PROVIDER_SITE_OTHER): Payer: Medicaid Other | Admitting: Cardiovascular Disease

## 2013-11-22 ENCOUNTER — Encounter: Payer: Self-pay | Admitting: Cardiovascular Disease

## 2013-11-22 ENCOUNTER — Other Ambulatory Visit: Payer: Self-pay | Admitting: Cardiovascular Disease

## 2013-11-22 VITALS — BP 180/97 | HR 84 | Resp 20 | Ht 66.0 in | Wt 148.9 lb

## 2013-11-22 DIAGNOSIS — I150 Renovascular hypertension: Secondary | ICD-10-CM

## 2013-11-22 DIAGNOSIS — E782 Mixed hyperlipidemia: Secondary | ICD-10-CM

## 2013-11-22 DIAGNOSIS — I701 Atherosclerosis of renal artery: Secondary | ICD-10-CM

## 2013-11-22 DIAGNOSIS — E119 Type 2 diabetes mellitus without complications: Secondary | ICD-10-CM

## 2013-11-22 DIAGNOSIS — K559 Vascular disorder of intestine, unspecified: Secondary | ICD-10-CM

## 2013-11-22 DIAGNOSIS — E785 Hyperlipidemia, unspecified: Secondary | ICD-10-CM

## 2013-11-22 DIAGNOSIS — I739 Peripheral vascular disease, unspecified: Secondary | ICD-10-CM

## 2013-11-22 DIAGNOSIS — Z79899 Other long term (current) drug therapy: Secondary | ICD-10-CM

## 2013-11-22 DIAGNOSIS — I4891 Unspecified atrial fibrillation: Secondary | ICD-10-CM

## 2013-11-22 DIAGNOSIS — I2510 Atherosclerotic heart disease of native coronary artery without angina pectoris: Secondary | ICD-10-CM

## 2013-11-22 NOTE — Telephone Encounter (Signed)
Wanted you to know that they are going to have to change his generic Warfarin to [place] brand. Is this all right?

## 2013-11-22 NOTE — Patient Instructions (Signed)
Your physician recommends that you return for lab work in: FASTING at [facility].  Dr. Sallyanne Kuster recommends that you schedule a follow-up appointment in: 2 month with an interpreter.

## 2013-11-23 ENCOUNTER — Encounter: Payer: Self-pay | Admitting: Cardiovascular Disease

## 2013-11-23 NOTE — Progress Notes (Signed)
Patient ID: [name], male   DOB: [birth year], 80 y.o.   MRN: [id number]      Reason for office visit Atrial fibrillation, CAD, PVD, renal artery stenosis, hypertension, diabetes mellitus, not insulin requiring  [name] is not having serious symptoms, but is unhappy with his current medical therapy. He has stopped taking aspirin, glipizide, metoprolol and alprazolam because he thinks there causing side effects. He is compliant with his warfarin therapy and his INR has been therapeutic at 2.2. He refuses to take generic diltiazem provided by the [facility] since it makes him feel bad, as compared to the equivalent dose diltiazem medication that he is bringing in from [place].  There is clearly a breakdown in his relationship with his daughter regarding his medications and his overall health. Although they're not an open conflict they clearly disagree and [name] seems to believe that his daughter is not translating his complaints accurately. We had made arrangements for transfer to be present today but for some reason the plan is to not work out. His daughter, [name], is again translating and the tension is evident. The patient states that when he takes a deep diltiazem provided by the [facility], has an unusual sensation in his head, behind both ears.  He denies problems with shortness of breath, angina pectoris or intermittent claudication. He has not had lower showed edema. He has lost weight and his appetite appears to be poor. He does not like the way his daughter cooks the food.  He underwent four-vessel coronary bypass surgery in 2003 and has normal left ventricular systolic function with a small inferolateral scar. He has a history of extensive peripheral arterial disease including occlusion of both femoral arteries and in the past who was severely disabled by claudication. This seems to have improved and he is also more sedentary. He developed renal  insufficiency and shock and early 2014, possibly due to the use of ACE inhibitors in the setting of bilateral renal artery stenosis. He has chronic kidney disease stage 2-3. There was also evidence of ischemic colitis. His blood pressure is both very hard to determine accurately, because of noncompressible vessels, and also hard to treat. He has permanent atrial fibrillation but as far as I know hasn't had a stroke, TIA or other embolic events.    Allergies  Allergen Reactions  . Percocet [Oxycodone-Acetaminophen] Other (See Comments)    "almost a heart attack"    Current Outpatient Prescriptions  Medication Sig Dispense Refill  . diltiazem (CARDIZEM) 60 MG tablet Take 1 tablet (60 mg total) by mouth 2 (two) times daily.  60 tablet  5  . Nutritional Supplements (ENSURE PO) Take 1 Can by mouth 2 (two) times daily.       Marland Kitchen warfarin (COUMADIN) 5 MG tablet TAKE 1 TABLET BY MOUTH DAILY OR AS DIRECTED  30 tablet  2  . metoprolol tartrate (LOPRESSOR) 25 MG tablet TAKE 1 TABLET BY MOUTH EVERY MORNING AND 1/2 (ONE-HALF) TABLET BY MOUTH EVERY EVENING  45 tablet  4   No current facility-administered medications for this visit.    Past Medical History  Diagnosis Date  . Permanent atrial fibrillation   . Hypertension   . HNP (herniated nucleus pulposus), lumbar   . PVD (peripheral vascular disease)   . Diabetes mellitus   . CAD (coronary artery disease)     s/p CABG x 4  . Ventricular hypertrophy   . Ischemic colitis   . Gastroenteritis, noninfectious   . Rectal bleeding  presumed secondary to ischemic colitis  . Depression     Past Surgical History  Procedure Laterality Date  . Cardiac catheterization  01/2002    40-50% distal LM/ostial LAD disease, 50-60% ostial LAD, 60% mid LAD, 30-40% D1, 70% tubular mid LCx, 70% ostial disease in two ramus intermedius branches, 70% prox RCA, 50-60% mid RCA, 70% distal RCA, LVEF 40%   . Coronary artery bypass graft  01/2002    x 4: LIMA-LAD,  SVG-OM, SVG-PDA-PLB  . Cholecystectomy N/A 09/27/2012    Procedure: LAPAROSCOPIC CHOLECYSTECTOMY WITH INTRAOPERATIVE CHOLANGIOGRAM;  Surgeon: Madilyn Hook, DO;  Location: [facility] OR;  Service: General;  Laterality: N/A;    Family History  Problem Relation Age of Onset  . Heart disease Mother   . Heart disease Sister   . Breast cancer Sister     History   Social History  . Marital Status: Widowed    Spouse Name: N/A    Number of Children: N/A  . Years of Education: N/A   Occupational History  . Not on file.   Social History Main Topics  . Smoking status: Former Smoker -- 1.00 packs/day for 30 years    Types: Cigarettes    Quit date: 02/15/1991  . Smokeless tobacco: Not on file  . Alcohol Use: No  . Drug Use: No  . Sexual Activity: Not on file   Other Topics Concern  . Not on file   Social History Narrative  . No narrative on file    Review of systems: Difficult to obtain secondary to language barrier. Other than described above appears to be negative.  PHYSICAL EXAM BP 180/97  Pulse 84  Resp 20  Ht 5\' 6"  (1.676 m)  Wt 148 lb 14.4 oz (67.541 kg)  BMI 24.04 kg/m2 HEENT: Pupils are equal round react to light accommodation extraocular movements are intact. Moist mucous membranes  Neck: Flat JVP, bilateral carotid bruits No cervical lymphadenopathy.  Cardiac: Irregularly irregular without murmur rub or gallop, widely split second heart sound  Lungs: clear to auscultation bilaterally, no rhonchi right lower lobe cleared with cough  Abd: soft, nontender, positive bowel sounds all quadrants, no hepatosplenomegaly  Ext: no lower extremity edema. 2+ left radial 1+ right radial and 1+ right dorsalis pedis pulses. No palpable left pedal pulses  Skin: warm and dry, no rashes  Neuro: Grossly normal, strength 4/5 equal upper and lower extremities   EKG: Atrial fibrillation, otherwise normal, good rate control   Lipid Panel     Component Value Date/Time   CHOL 145 09/25/2012  0656   TRIG 86 09/25/2012 0656   HDL 39* 09/25/2012 0656   CHOLHDL 3.7 09/25/2012 0656   VLDL 17 09/25/2012 0656   LDLCALC 89 09/25/2012 0656    BMET    Component Value Date/Time   NA 139 04/08/2013 1341   K 4.4 04/08/2013 1341   CL 98 04/08/2013 1341   CO2 31 04/08/2013 1341   GLUCOSE 197* 04/08/2013 1341   BUN 27* 04/08/2013 1341   CREATININE 1.28 04/08/2013 1341   CREATININE 1.41* 12/02/2012 2210   CALCIUM 9.7 04/08/2013 1341   GFRNONAA 46* 12/02/2012 2210   GFRAA 54* 12/02/2012 2210     ASSESSMENT AND PLAN  We need to bring the patient back with an interpreter to maintain a good channel of communication and trust. I have no problem with the generic diltiazem that he is getting from [place]. His ventricular rate is well controlled and he seemed to tolerated well. He is  appropriate anticoagulated. His blood pressure appears to be severely elevated but he has been intolerant of numerous medications including metoprolol and amlodipine. We have been cautious in the use of diuretics because of renal failure. He could try hydralazine. Another problem is that at this point his vascular risk factors are not well addressed. His diabetes mellitus and hyperlipidemia are not being treated and his blood pressure is elevated. We'll recheck laboratory tests.  Orders Placed This Encounter  Procedures  . Comprehensive metabolic panel  . Lipid panel   No orders of the defined types were placed in this encounter.    Glorious Flicker  Sanda Klein, MD, [facility] [phone] office [phone] pager

## 2013-11-29 ENCOUNTER — Ambulatory Visit: Payer: Medicaid Other | Admitting: Cardiovascular Disease

## 2013-12-23 ENCOUNTER — Ambulatory Visit (INDEPENDENT_AMBULATORY_CARE_PROVIDER_SITE_OTHER): Payer: Medicaid Other | Admitting: Pharmacist Clinician (PhC)/ Clinical Pharmacy Specialist

## 2013-12-23 DIAGNOSIS — Z7901 Long term (current) use of anticoagulants: Secondary | ICD-10-CM

## 2013-12-23 DIAGNOSIS — I4891 Unspecified atrial fibrillation: Secondary | ICD-10-CM

## 2013-12-23 LAB — POCT INR: INR: 1.9

## 2013-12-24 LAB — COMPREHENSIVE METABOLIC PANEL
ALBUMIN: 3.8 g/dL (ref 3.5–5.2)
ALT: 23 U/L (ref 0–53)
AST: 21 U/L (ref 0–37)
Alkaline Phosphatase: 42 U/L (ref 39–117)
BILIRUBIN TOTAL: 1.1 mg/dL (ref 0.2–1.2)
BUN: 28 mg/dL — ABNORMAL HIGH (ref 6–23)
CO2: 24 meq/L (ref 19–32)
Calcium: 9.1 mg/dL (ref 8.4–10.5)
Chloride: 102 mEq/L (ref 96–112)
Creat: 1.32 mg/dL (ref 0.50–1.35)
GLUCOSE: 221 mg/dL — AB (ref 70–99)
Potassium: 4.4 mEq/L (ref 3.5–5.3)
SODIUM: 136 meq/L (ref 135–145)
TOTAL PROTEIN: 6.3 g/dL (ref 6.0–8.3)

## 2013-12-24 LAB — LIPID PANEL
CHOLESTEROL: 131 mg/dL (ref 0–200)
HDL: 34 mg/dL — ABNORMAL LOW (ref >39)
LDL Cholesterol: 75 mg/dL (ref 0–99)
TRIGLYCERIDES: 111 mg/dL (ref <150)
Total CHOL/HDL Ratio: 3.9 Ratio
VLDL: 22 mg/dL (ref 0–40)

## 2014-01-17 ENCOUNTER — Telehealth: Payer: Self-pay | Admitting: *Deleted

## 2014-01-17 NOTE — Telephone Encounter (Signed)
Lab results given to daughter.  Suggested she make an appt for her father with his PCP to evaluate DM.

## 2014-01-17 NOTE — Telephone Encounter (Signed)
Message copied by Tressa Busman on Fri Jan 17, 2014  8:30 AM ------      Message from: Sanda Klein      Created: Wed Jan 15, 2014  3:02 PM       Cholesterol is great. Glucose is high, consistently around 200. Kidney function at baseline ------

## 2014-01-31 ENCOUNTER — Other Ambulatory Visit: Payer: Self-pay | Admitting: *Deleted

## 2014-01-31 MED ORDER — WARFARIN SODIUM 5 MG PO TABS
ORAL_TABLET | ORAL | Status: DC
Start: 2014-01-31 — End: 2014-03-24

## 2014-01-31 NOTE — Telephone Encounter (Signed)
Refill for warfarin. Patient has an appt with Dr C on 7/31

## 2014-02-07 ENCOUNTER — Ambulatory Visit (INDEPENDENT_AMBULATORY_CARE_PROVIDER_SITE_OTHER): Payer: Medicaid Other | Admitting: Cardiovascular Disease

## 2014-02-07 ENCOUNTER — Encounter: Payer: Self-pay | Admitting: Cardiovascular Disease

## 2014-02-07 ENCOUNTER — Ambulatory Visit (INDEPENDENT_AMBULATORY_CARE_PROVIDER_SITE_OTHER): Payer: Medicaid Other | Admitting: Pharmacist Clinician (PhC)/ Clinical Pharmacy Specialist

## 2014-02-07 VITALS — BP 150/90 | HR 97 | Resp 20 | Ht 66.0 in | Wt 144.6 lb

## 2014-02-07 DIAGNOSIS — I4821 Permanent atrial fibrillation: Secondary | ICD-10-CM

## 2014-02-07 DIAGNOSIS — I701 Atherosclerosis of renal artery: Secondary | ICD-10-CM

## 2014-02-07 DIAGNOSIS — I7389 Other specified peripheral vascular diseases: Secondary | ICD-10-CM

## 2014-02-07 DIAGNOSIS — I2510 Atherosclerotic heart disease of native coronary artery without angina pectoris: Secondary | ICD-10-CM

## 2014-02-07 DIAGNOSIS — E785 Hyperlipidemia, unspecified: Secondary | ICD-10-CM

## 2014-02-07 DIAGNOSIS — I739 Peripheral vascular disease, unspecified: Secondary | ICD-10-CM

## 2014-02-07 DIAGNOSIS — F3289 Other specified depressive episodes: Secondary | ICD-10-CM

## 2014-02-07 DIAGNOSIS — I25119 Atherosclerotic heart disease of native coronary artery with unspecified angina pectoris: Secondary | ICD-10-CM

## 2014-02-07 DIAGNOSIS — K559 Vascular disorder of intestine, unspecified: Secondary | ICD-10-CM

## 2014-02-07 DIAGNOSIS — I150 Renovascular hypertension: Secondary | ICD-10-CM

## 2014-02-07 DIAGNOSIS — F32A Depression, unspecified: Secondary | ICD-10-CM

## 2014-02-07 DIAGNOSIS — I209 Angina pectoris, unspecified: Secondary | ICD-10-CM

## 2014-02-07 DIAGNOSIS — Z7901 Long term (current) use of anticoagulants: Secondary | ICD-10-CM

## 2014-02-07 DIAGNOSIS — I4891 Unspecified atrial fibrillation: Secondary | ICD-10-CM

## 2014-02-07 DIAGNOSIS — F329 Major depressive disorder, single episode, unspecified: Secondary | ICD-10-CM

## 2014-02-07 LAB — POCT INR: INR: 2.1

## 2014-02-07 MED ORDER — AMITRIPTYLINE HCL 25 MG PO TABS: 25.0000 mg | ORAL_TABLET | Freq: Every day | ORAL | Status: AC

## 2014-02-07 NOTE — Patient Instructions (Signed)
Your physician has recommended you make the following change in your medication: START amitriptyline 25mg  one tablet at bedtime.  This has been sent to your pharmacy.   Your physician wants you to follow-up in: 6 months. You will receive a reminder letter in the mail two months in advance. If you don't receive a letter, please call our office to schedule the follow-up appointment.

## 2014-02-08 NOTE — Progress Notes (Signed)
Patient ID: [name], male   DOB: 09/06/1931, 80 y.o.   MRN: [id number]      Reason for office visit Atrial fibrillation, CAD status post CABG, PAD  [name] is accompanied today by his daughter [name] and also an interpreter. We asked for an interpreter to be present since there is obvious tension between the patient and his daughter. Despite our efforts to focus the discussion to medical problems, their relationship difficulties repeatedly interfered with the medical assessment. In fact, despite very severe complications of vascular disease, the patient has not had any serious acute problems in quite a while (about 18 months since his hospitalization with acute renal failure and ischemic colitis). It is also quite apparent that there is direct interpolated between the patient's symptoms and his emotional state. He has complaints of discomfort in the pit of his stomach that is brought on consistently by emotional distress, never by physical exertion or at rest.  He states that he worries about his daughter and his granddaughter and is very unhappy about the role of his daughter's ex-husband. He feels that the ex-husband takes advantage of his daughter although [name] thinks that their relationship is cordial and appropriate. [name] is clearly at the end of her tether and cried a couple of times during the appointment today. They had repeated arguments. She says that he moans all night long and cries which prevents her from getting any rest and interferes with her ability to work. He is clearly unhappy and depressed and very isolated in the [place]. She is thinking of returning him to [place].  He underwent four-vessel coronary bypass surgery in 2003 and has normal left ventricular systolic function with a small inferolateral scar. He has a history of extensive peripheral arterial disease including occlusion of both femoral arteries and in the past who was severely disabled by claudication.  This seems to have improved and he is also more sedentary. He developed renal insufficiency and shock and early 2014, possibly due to the use of ACE inhibitors in the setting of bilateral renal artery stenosis. He has chronic kidney disease stage 2-3. There was also evidence of ischemic colitis. His blood pressure is both very hard to determine accurately, because of noncompressible vessels, and also hard to treat. He has permanent atrial fibrillation but as far as I know hasn't had a stroke, TIA or other embolic events.    Allergies  Allergen Reactions  . Percocet [Oxycodone-Acetaminophen] Other (See Comments)    "almost a heart attack"    Current Outpatient Prescriptions  Medication Sig Dispense Refill  . diltiazem (CARDIZEM) 60 MG tablet Take 1 tablet (60 mg total) by mouth 2 (two) times daily.  60 tablet  5  . Nutritional Supplements (ENSURE PO) Take 1 Can by mouth 2 (two) times daily.       Marland Kitchen warfarin (COUMADIN) 5 MG tablet TAKE 1 TABLET BY MOUTH DAILY OR AS DIRECTED  30 tablet  0  . amitriptyline (ELAVIL) 25 MG tablet Take 1 tablet (25 mg total) by mouth at bedtime.  30 tablet  6   No current facility-administered medications for this visit.    Past Medical History  Diagnosis Date  . Permanent atrial fibrillation   . Hypertension   . HNP (herniated nucleus pulposus), lumbar   . PVD (peripheral vascular disease)   . Diabetes mellitus   . CAD (coronary artery disease)     s/p CABG x 4  . Ventricular hypertrophy   . Ischemic colitis   .  Gastroenteritis, noninfectious   . Rectal bleeding     presumed secondary to ischemic colitis  . Depression     Past Surgical History  Procedure Laterality Date  . Cardiac catheterization  01/2002    40-50% distal LM/ostial LAD disease, 50-60% ostial LAD, 60% mid LAD, 30-40% D1, 70% tubular mid LCx, 70% ostial disease in two ramus intermedius branches, 70% prox RCA, 50-60% mid RCA, 70% distal RCA, LVEF 40%   . Coronary artery bypass graft   01/2002    x 4: LIMA-LAD, SVG-OM, SVG-PDA-PLB  . Cholecystectomy N/A 09/27/2012    Procedure: LAPAROSCOPIC CHOLECYSTECTOMY WITH INTRAOPERATIVE CHOLANGIOGRAM;  Surgeon: Madilyn Hook, DO;  Location: [facility] OR;  Service: General;  Laterality: N/A;    Family History  Problem Relation Age of Onset  . Heart disease Mother   . Heart disease Sister   . Breast cancer Sister     History   Social History  . Marital Status: Widowed    Spouse Name: N/A    Number of Children: N/A  . Years of Education: N/A   Occupational History  . Not on file.   Social History Main Topics  . Smoking status: Former Smoker -- 1.00 packs/day for 30 years    Types: Cigarettes    Quit date: 02/15/1991  . Smokeless tobacco: Not on file  . Alcohol Use: No  . Drug Use: No  . Sexual Activity: Not on file   Other Topics Concern  . Not on file   Social History Narrative  . No narrative on file    Review of systems: The patient specifically denies dyspnea at rest or with exertion, orthopnea, paroxysmal nocturnal dyspnea, syncope, palpitations, focal neurological deficits, lower extremity edema, unexplained weight gain, cough, hemoptysis or wheezing. He is very sedentary which make it hard to assess for intermittent claudication  The patient also denies abdominal pain, nausea, vomiting, dysphagia, diarrhea, constipation, polyuria, polydipsia, dysuria, hematuria, frequency, urgency, abnormal bleeding or bruising, fever, chills, unexpected weight changes, mood swings, change in skin or hair texture, change in voice quality, auditory or visual problems, allergic reactions or rashes, new musculoskeletal complaints other than usual "aches and pains".   PHYSICAL EXAM BP 150/90  Pulse 97  Resp 20  Ht 5\' 6"  (1.676 m)  Wt 144 lb 9.6 oz (65.59 kg)  BMI 23.35 kg/m2 HEENT: Pupils are equal round react to light accommodation extraocular movements are intact. Moist mucous membranes  Neck: Flat JVP, bilateral carotid bruits  No cervical lymphadenopathy.  Cardiac: Irregularly irregular without murmur rub or gallop, widely split second heart sound  Lungs: clear to auscultation bilaterally, no rhonchi right lower lobe cleared with cough  Abd: soft, nontender, positive bowel sounds all quadrants, no hepatosplenomegaly  Ext: no lower extremity edema. 2+ left radial 1+ right radial and 1+ right dorsalis pedis pulses. No palpable left pedal pulses  Skin: warm and dry, no rashes  Neuro: Grossly normal, strength 4/5 equal upper and lower extremities   EKG: Atrial fibrillation, otherwise normal, good rate control   Lipid Panel     Component Value Date/Time   CHOL 131 12/23/2013 0922   TRIG 111 12/23/2013 0922   HDL 34* 12/23/2013 0922   CHOLHDL 3.9 12/23/2013 0922   VLDL 22 12/23/2013 0922   LDLCALC 75 12/23/2013 0922    BMET    Component Value Date/Time   NA 136 12/23/2013 0922   K 4.4 12/23/2013 0922   CL 102 12/23/2013 0922   CO2 24 12/23/2013 1829  GLUCOSE 221* 12/23/2013 0922   BUN 28* 12/23/2013 0922   CREATININE 1.32 12/23/2013 0922   CREATININE 1.41* 12/02/2012 2210   CALCIUM 9.1 12/23/2013 0922   GFRNONAA 46* 12/02/2012 2210   GFRAA 54* 12/02/2012 2210     ASSESSMENT AND PLAN  CAD s/p CABG. It is possible that Mr. Able epigastric discomfort is an angina equivalent, although this is quite uncertain. It is following a predictable, stable pattern. At worst, he has stable angina. A fairly recent nuclear perfusion study showed low-risk findings.  He has severe PAD of the lower extremities, but this does not appear to be interfering with his lifestyle, which is extremely sedentary. Previous plans for peripheral angiography were deferred when he developed acute renal failure last year. He has evidence of visceral artery stenosis (ischemic colitis, renal artery stenosis with probable ACE inhibitor induced kidney failure). Conservative management appears to remain the best approach.  He has extremely stiff,  difficult to compress arteries and HTN/blood pressure assessment is not very reliable. Attempts to place him on other antihypertensive medications have been repeatedly rebuffed since he complains of side effects from them.  Similarly, he declares that he does not need treatment for his diabetes mellitus. Thankfully, his lipid profile is excellent despite the absence of specific therapy.  Atrial fibrillation rate control is adequate. Although he is not 100% compliance with prothrombin time checks, his prothrombin time is always within range. He has not had bleeding complications or embolic events.  At this point in time his social/emotional issues appear to be the biggest impediment to his well-being. He has no social support in the [place] except for his daughter and granddaughter and is clearly feeling out of place and isolated. The cultural isolation next hard for him to benefit from any of the typical services for the elderly available here. It may well be that he would be happier in [place], but I'm not sure that there is anybody there can take care of him. This is a difficult problem that does not have a medical solution.  He is clearly depressed and unable to sleep at night. He does not have a history of prostatism or glaucoma and I gave him a prescription for low-dose amitriptyline.  No orders of the defined types were placed in this encounter.   Meds ordered this encounter  Medications  . amitriptyline (ELAVIL) 25 MG tablet    Sig: Take 1 tablet (25 mg total) by mouth at bedtime.    Dispense:  30 tablet    Refill:  [place] Miriah Maruyama, MD, [facility] [phone] office [phone] pager

## 2014-03-21 ENCOUNTER — Ambulatory Visit: Payer: Medicaid Other | Admitting: Pharmacist Clinician (PhC)/ Clinical Pharmacy Specialist

## 2014-03-24 ENCOUNTER — Other Ambulatory Visit: Payer: Self-pay

## 2014-03-25 MED ORDER — WARFARIN SODIUM 5 MG PO TABS: ORAL_TABLET | ORAL | Status: AC

## 2014-12-18 IMAGING — CT CT HEAD W/O CM
1 series · 15 of 30 positions shown, 19 images · non-contrast
Comparison: Unenhanced cranial CT 12/18/2006.

CLINICAL DATA: Dizziness.  Generalized weakness.  Current history
of hypertension.

CT HEAD WITHOUT CONTRAST
TECHNIQUE: Contiguous axial images were obtained from the base of
the skull through the vertex without contrast.

[Series 2: head trauma 4.8 h37s · axial · 0.43mm/px · z∈[+45,+200]mm · 15 of 36 slices shown, 19 images]
[im 2/36  brain]
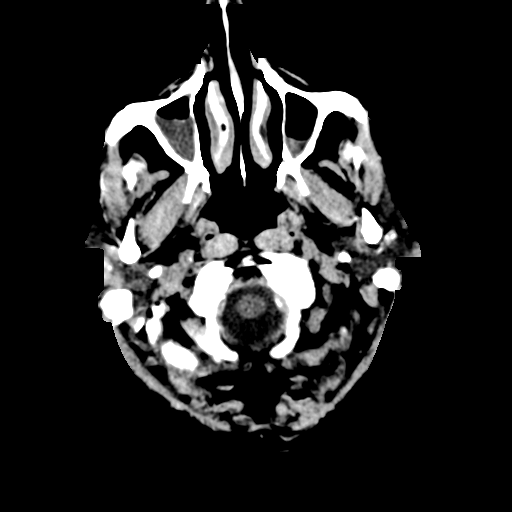
[im 2/36  bone]
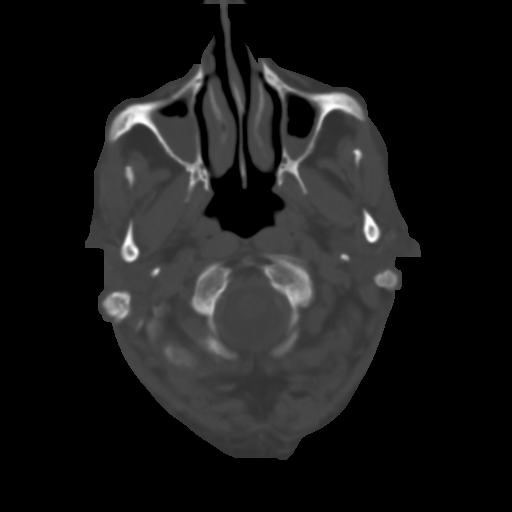
[im 4/36  brain]
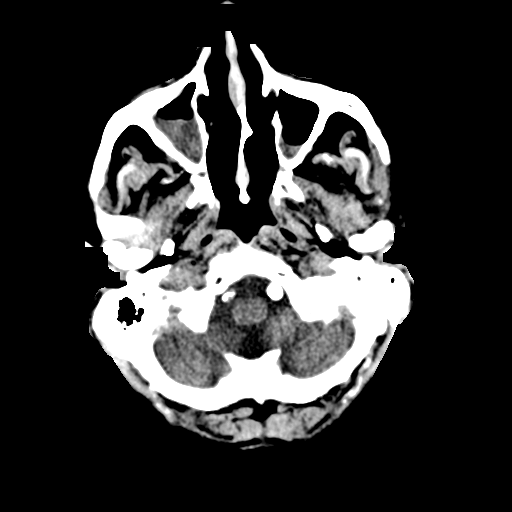
[im 7/36  brain]
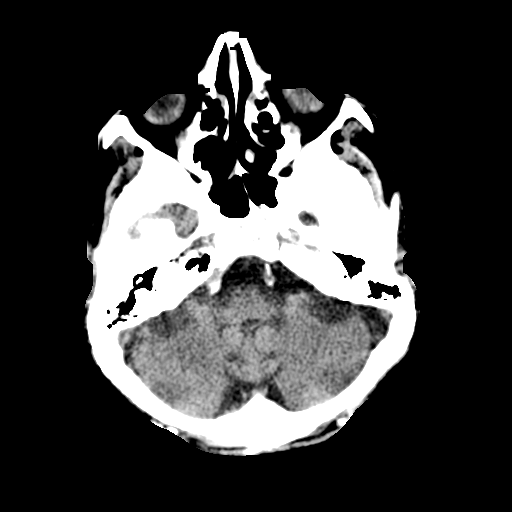
[im 9/36  brain]
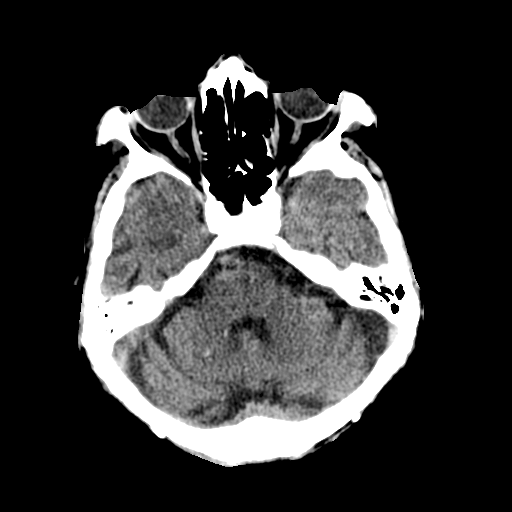
[im 11/36  brain]
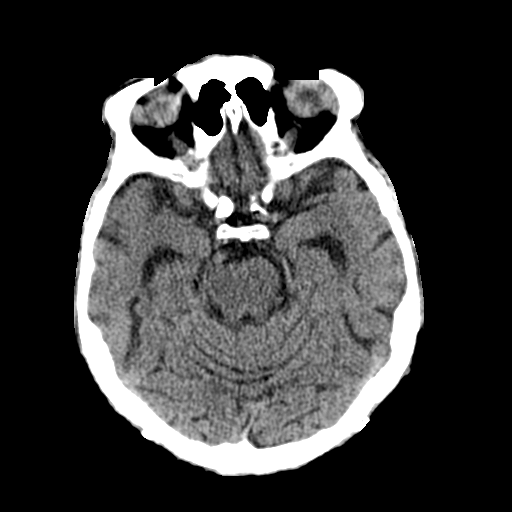
[im 11/36  bone]
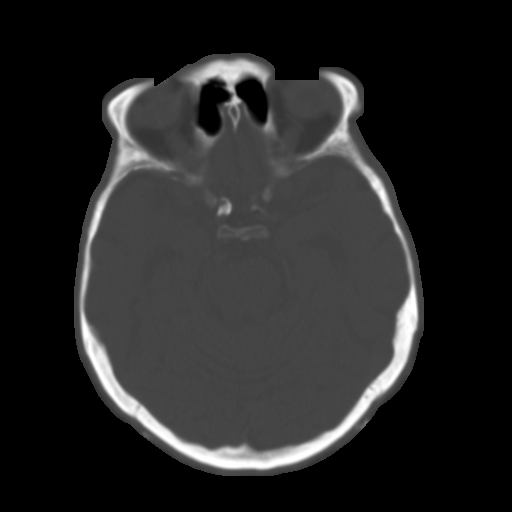
[im 14/36  brain]
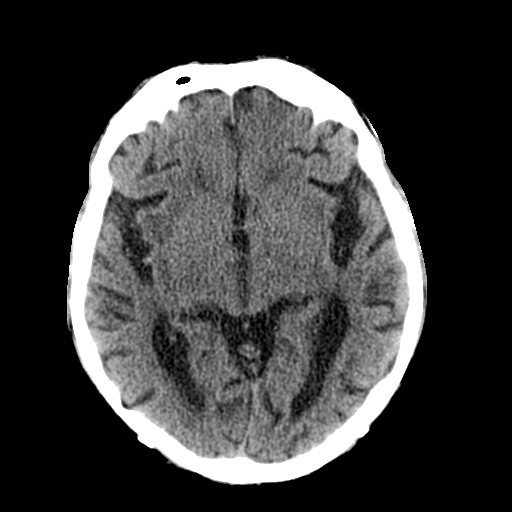
[im 16/36  brain]
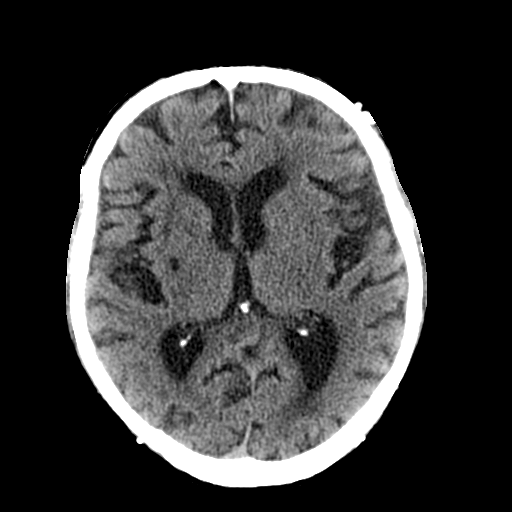
[im 19/36  brain]
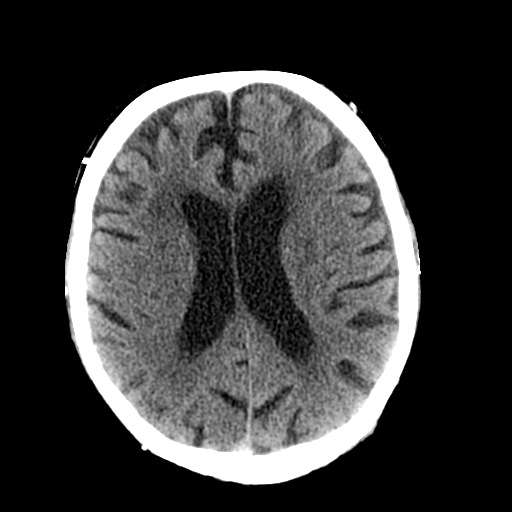
[im 20/36  brain]
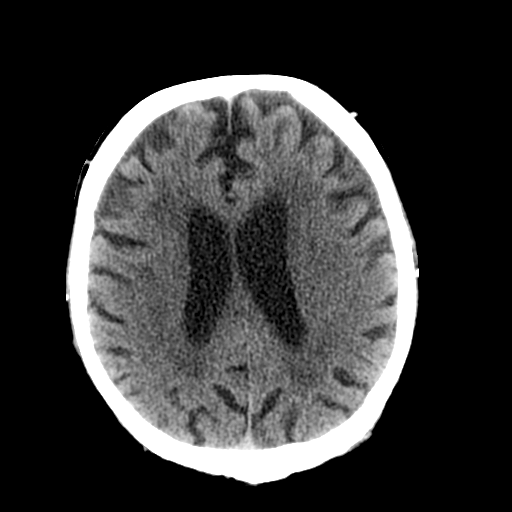
[im 20/36  bone]
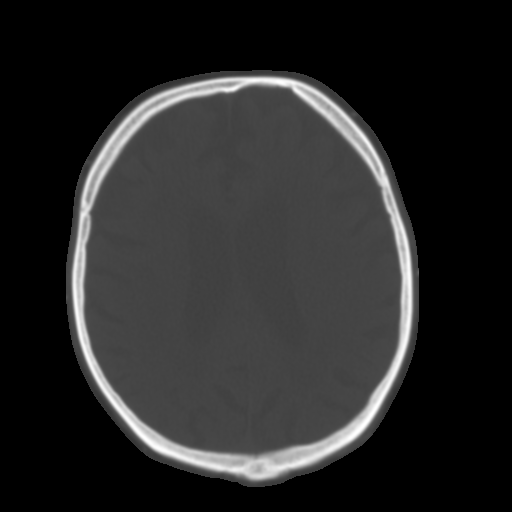
[im 22/36  brain]
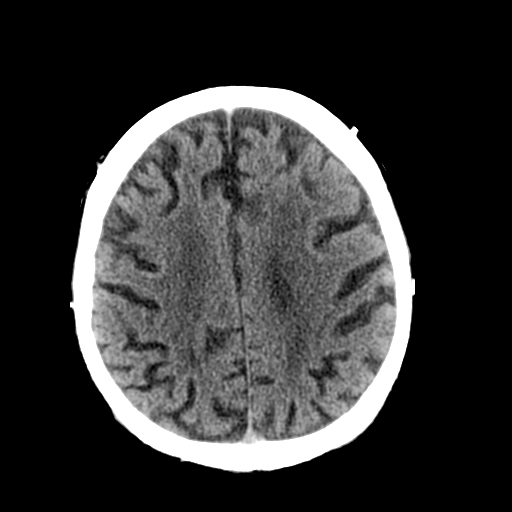
[im 25/36  brain]
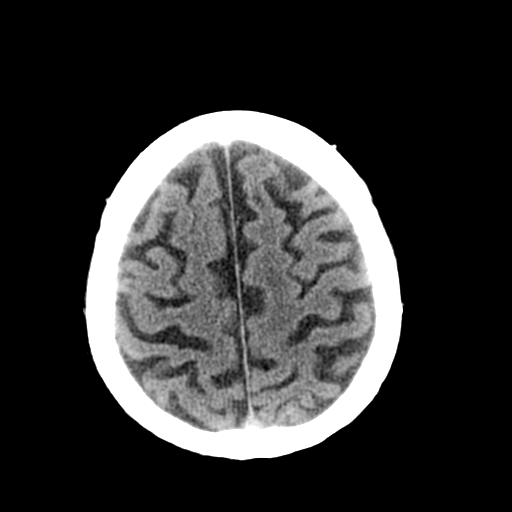
[im 27/36  brain]
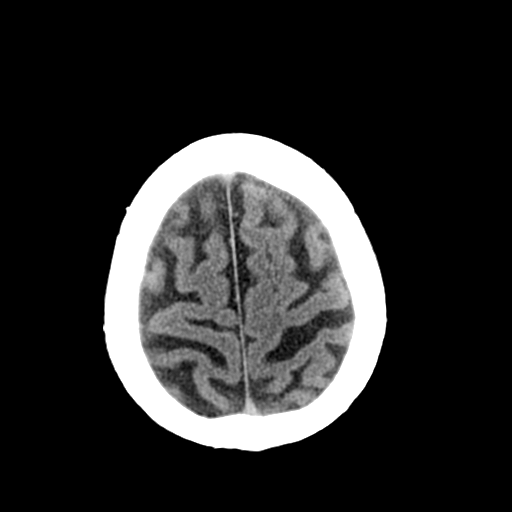
[im 29/36  brain]
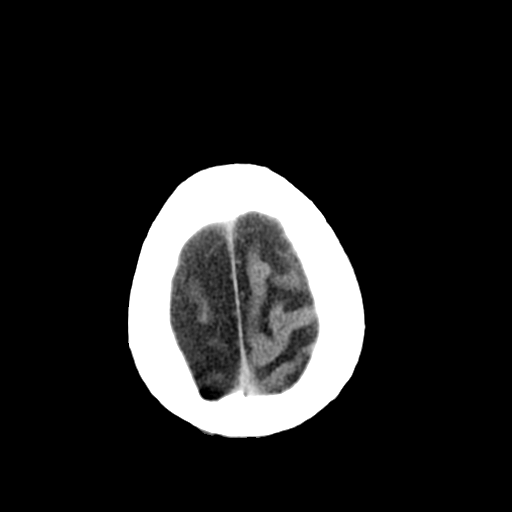
[im 29/36  bone]
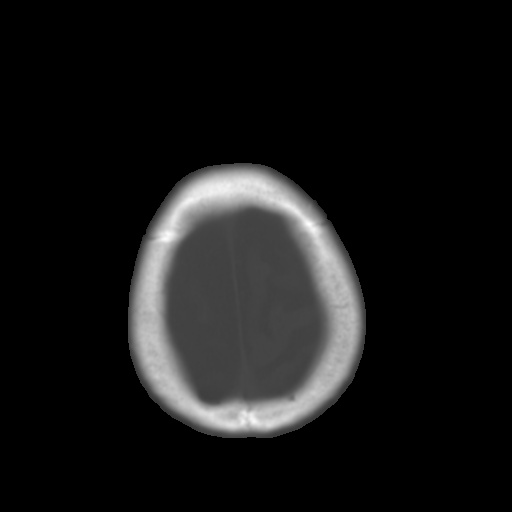
[im 32/36  brain]
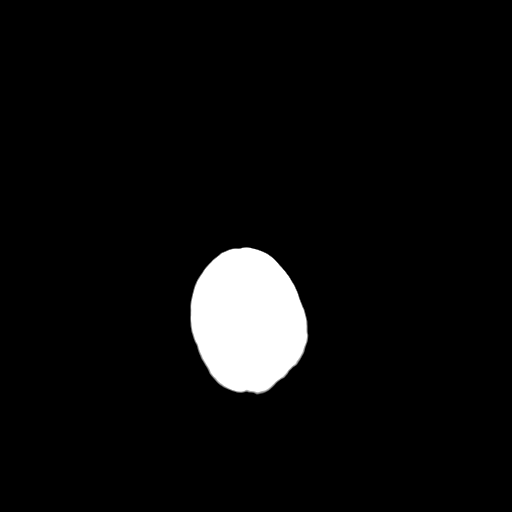
[im 34/36  brain]
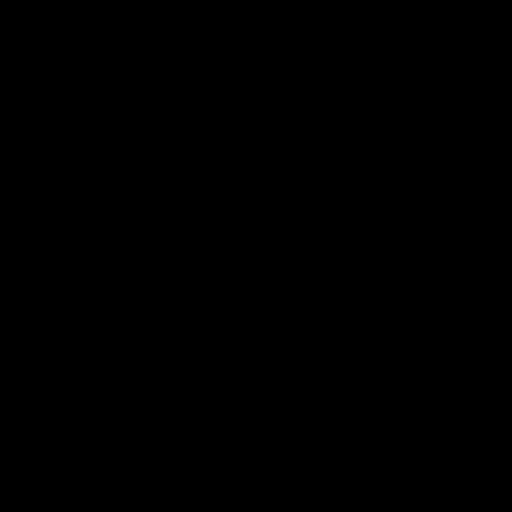

[15 of 30 positions shown; findings below may reference images not displayed]

FINDINGS: Moderate cortical and deep atrophy and mild to moderate
cerebellar atrophy, unchanged.  Moderate to severe changes of small
vessel disease of the white matter diffusely, unchanged.  Old
lacunar strokes in the right basal ganglia, unchanged.  No mass
lesion.  No midline shift.  No acute hemorrhage or hematoma.  No
extra-axial fluid collections.  No evidence of acute infarction.
No significant interval change.

No skull fracture or other focal osseous abnormality involving the
skull.  Opacification of both maxillary sinuses, right greater than
left, with air-fluid levels.  Opacification of scattered ethmoid
air cells bilaterally.  Inspissated mucous in the right sphenoid
sinus with a small fluid level.  Bilateral mastoid air cells and
middle ear cavities well-aerated.
IMPRESSION: 1.  No acute intracranial abnormality.
2.  Stable moderate generalized atrophy, moderate to severe chronic
microvascular ischemic changes of the white matter, and old lacunar
strokes in the right basal ganglia.
3.  Acute bilateral maxillary sinusitis, right greater than left.
Bilateral ethmoid sinusitis which may be acute or chronic.  Mild
acute right sphenoid sinusitis.

## 2014-12-18 IMAGING — US US ABDOMEN COMPLETE
1 series · 13 of 25 positions shown · non-contrast
Comparison: None.

CLINICAL DATA: Abdominal pain.  Elevated lipase and  BUN.
Diabetic hypertensive patient.

COMPLETE ABDOMINAL ULTRASOUND

[Series 1: us abdomen complete · 0.26mm/px · 13 of 92 slices shown]
[im 1/92]
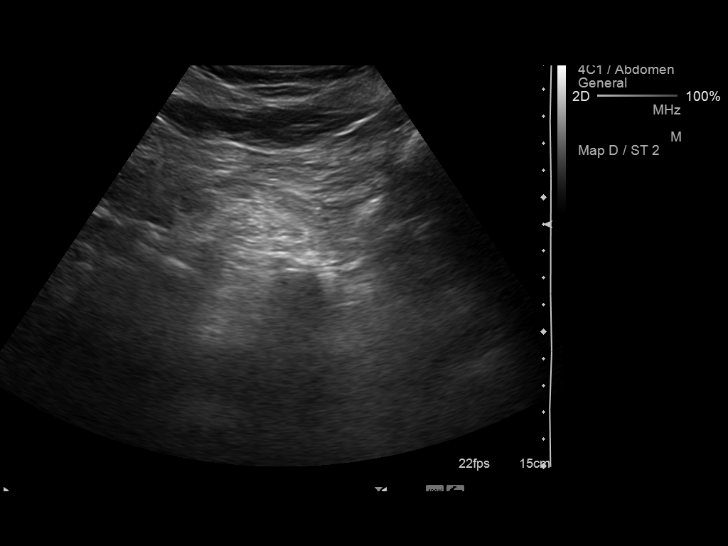
[im 8/92]
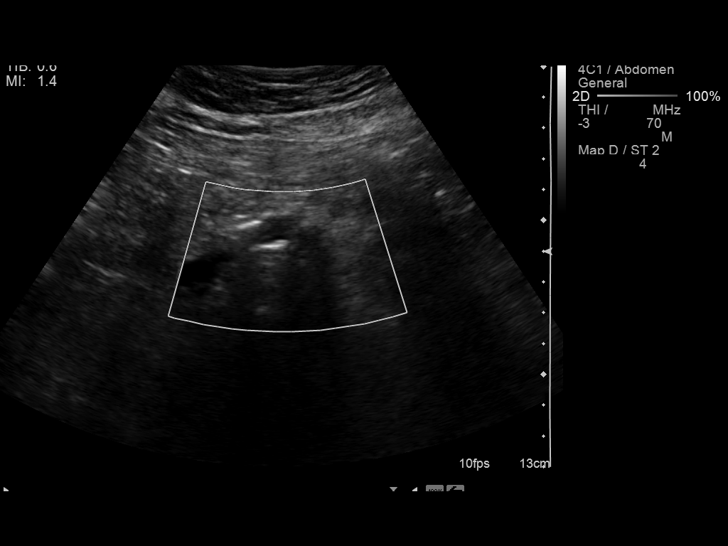
[im 16/92]
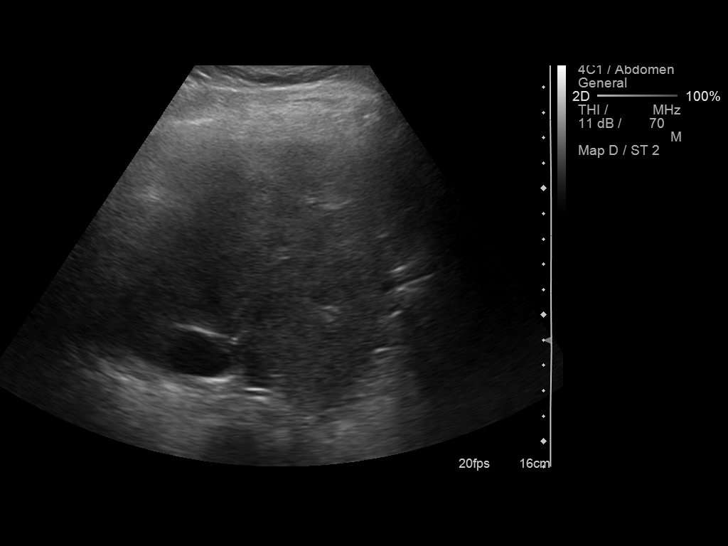
[im 23/92]
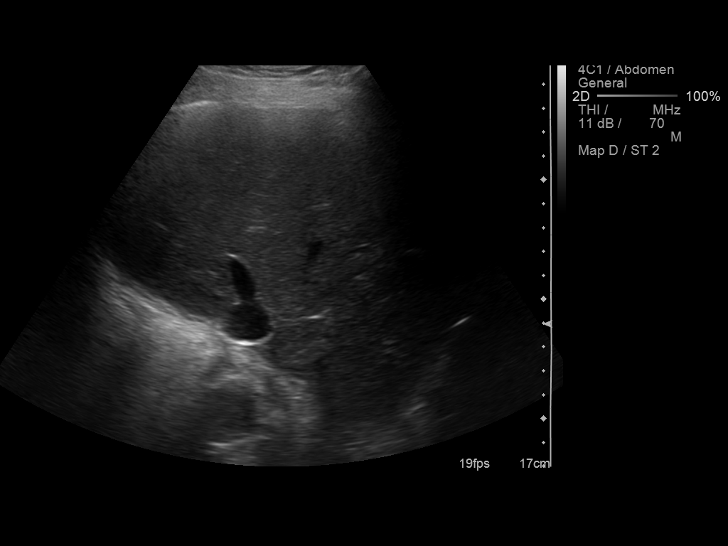
[im 31/92]
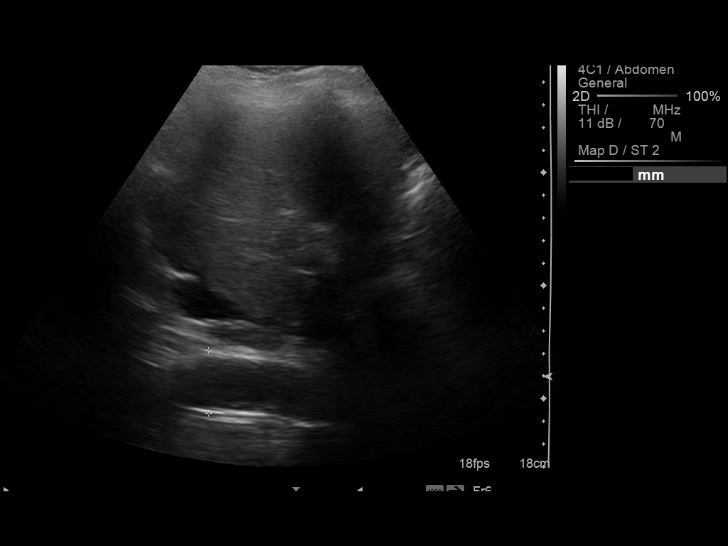
[im 38/92]
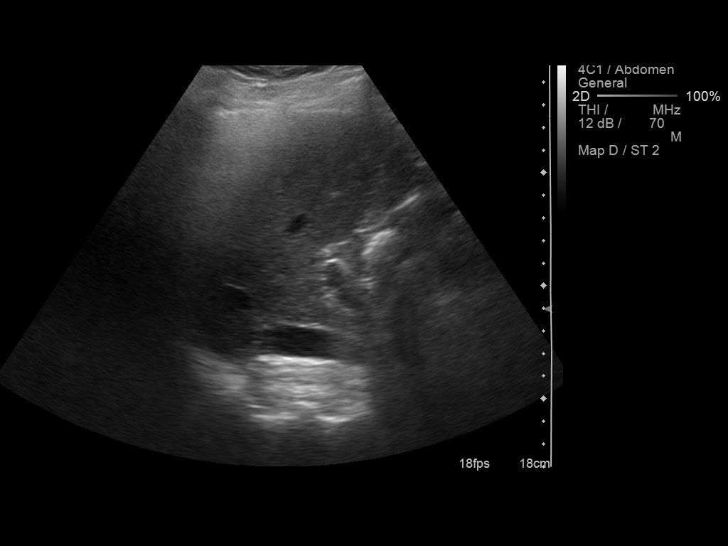
[im 46/92]
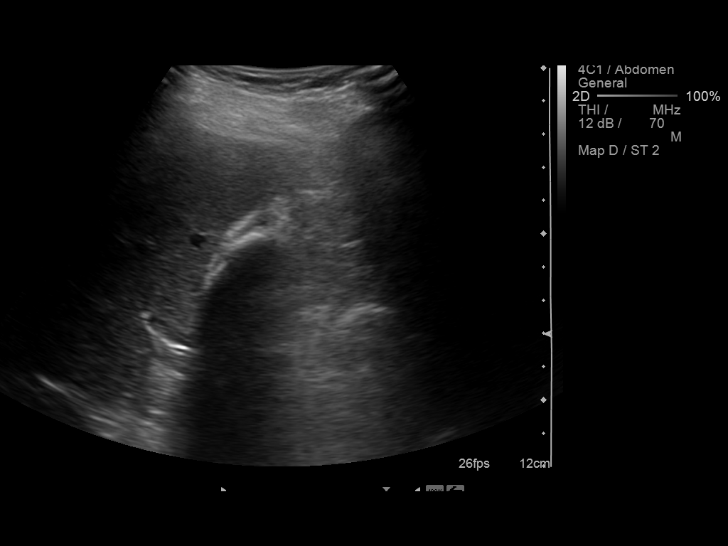
[im 54/92]
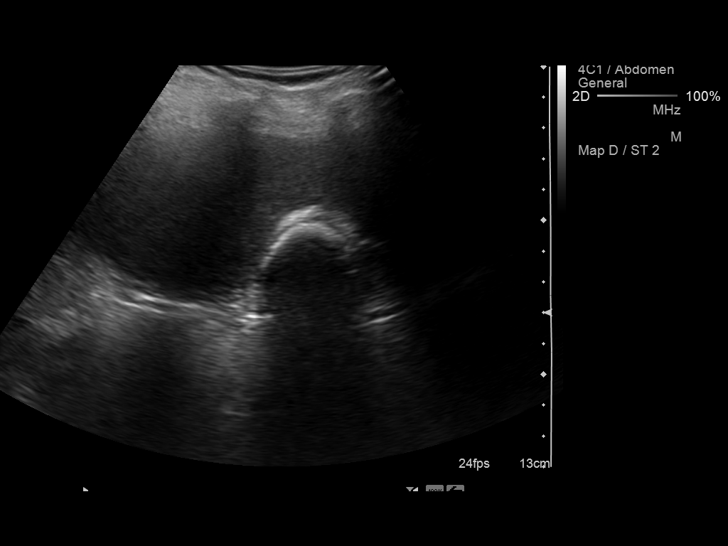
[im 61/92]
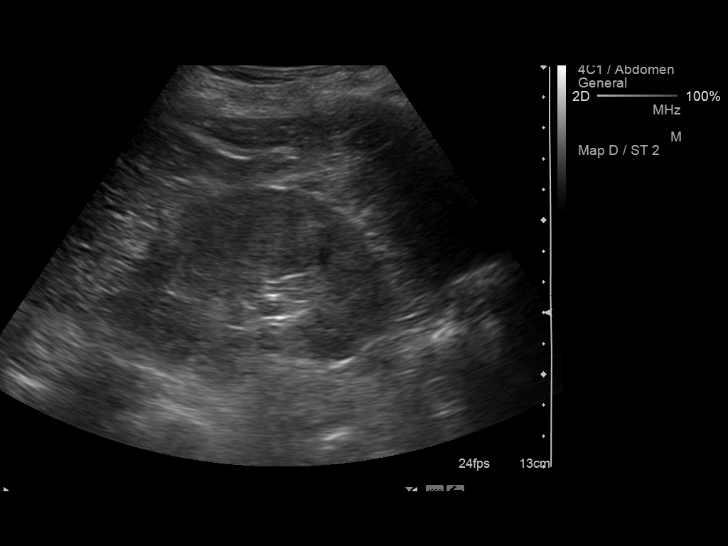
[im 69/92]
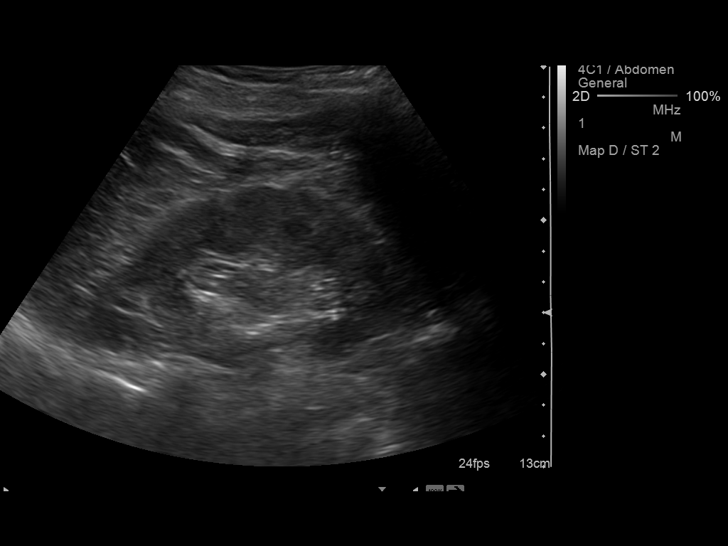
[im 76/92]
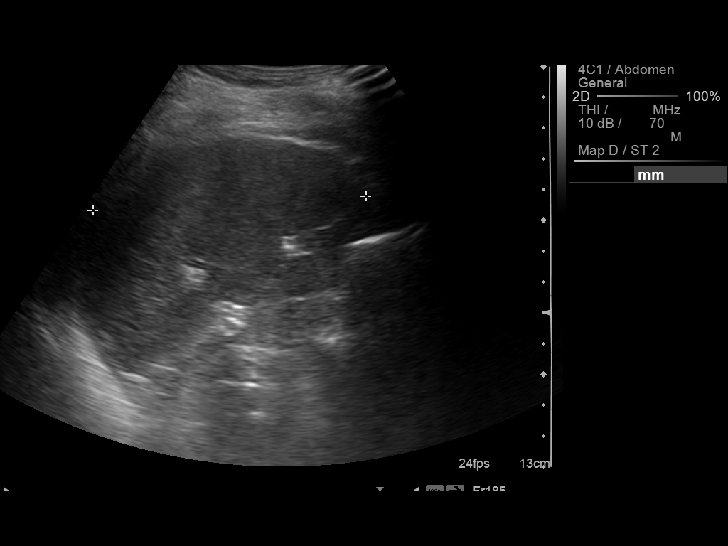
[im 84/92]
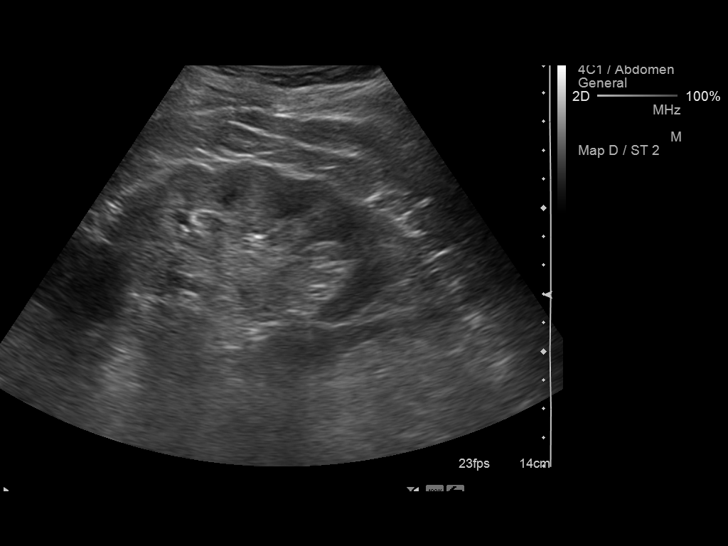
[im 92/92]
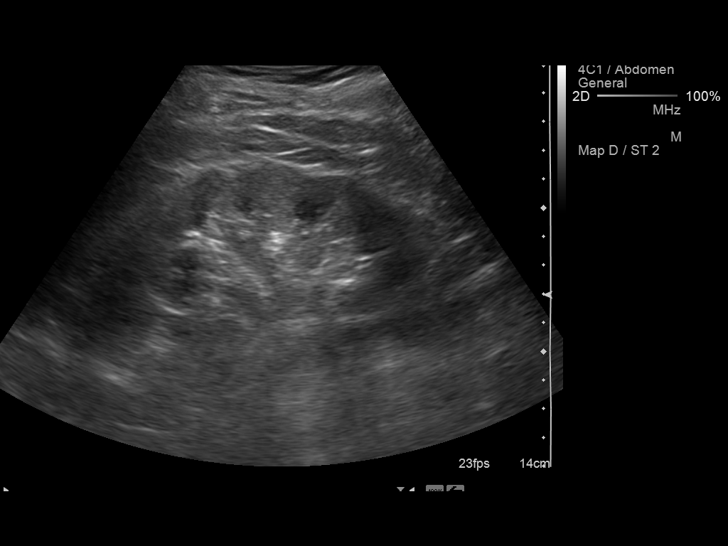

[13 of 25 positions shown; findings below may reference images not displayed]

FINDINGS: Gallbladder:  Echogenic shadowing in the gallbladder fossa
suggestive of gallstones.  Gallbladder wall thickening of the
mm.  Per ultrasound technologist, the patient was not tender over
this region during scanning.

Common bile duct:  6.5 mm proximally.  The mid and distal aspect
not visualized secondary to bowel gas.

Liver:  No focal lesion identified.  Within normal limits in
parenchymal echogenicity.

IVC:  Appears normal.

Pancreas:  Not visualized secondary to bowel gas.

Spleen:  8.9 cm.  Accessory splenic tissue noted.

Right Kidney:  9.1 cm. No hydronephrosis or renal mass..Mild
increased echogenicity may represent result of medical renal
disease type changes.

Left Kidney:  10.7 cm.  No hydronephrosis or renal mass..Mild
increased echogenicity may represent result of medical renal
disease type changes.

Abdominal aorta:  Ectatic and calcified consistent with
atherosclerotic type changes.  The full extent of the aorta is not
imaged secondary to bowel gas.  Portions visualized with maximal AP
dimension 2.8 cm.
IMPRESSION: Echogenic shadowing in the gallbladder fossa suggestive of
gallstones.  Gallbladder wall thickening of the 3.7 mm.

Pancreas, portions of the abdominal aorta and mid to distal common
bile duct not visualized secondary to bowel gas.

Increased echogenicity of renal parenchyma may represent result of
medical renal disease type changes.

## 2016-04-08 ENCOUNTER — Ambulatory Visit: Payer: Self-pay | Admitting: Pharmacist Clinician (PhC)/ Clinical Pharmacy Specialist

## 2016-04-08 DIAGNOSIS — Z7901 Long term (current) use of anticoagulants: Secondary | ICD-10-CM
